# Patient Record
Sex: Male | Born: 1985 | Race: White | Hispanic: No | Marital: Single
Health system: Midwestern US, Community
[De-identification: ages and names within clinical notes are randomized; demographics above are authoritative.]

## PROBLEM LIST (undated history)

## (undated) ENCOUNTER — Emergency Department (HOSPITAL_COMMUNITY): Admission: EM | Payer: BC Managed Care – PPO | Source: Home / Self Care

## (undated) DIAGNOSIS — B182 Chronic viral hepatitis C: Principal | ICD-10-CM

## (undated) DIAGNOSIS — E785 Hyperlipidemia, unspecified: Secondary | ICD-10-CM

## (undated) DIAGNOSIS — F988 Other specified behavioral and emotional disorders with onset usually occurring in childhood and adolescence: Secondary | ICD-10-CM

## (undated) DIAGNOSIS — F419 Anxiety disorder, unspecified: Secondary | ICD-10-CM

## (undated) DIAGNOSIS — K529 Noninfective gastroenteritis and colitis, unspecified: Secondary | ICD-10-CM

## (undated) DIAGNOSIS — K759 Inflammatory liver disease, unspecified: Secondary | ICD-10-CM

## (undated) DIAGNOSIS — L0390 Cellulitis, unspecified: Secondary | ICD-10-CM

## (undated) DIAGNOSIS — E559 Vitamin D deficiency, unspecified: Secondary | ICD-10-CM

## (undated) HISTORY — DX: Hyperlipidemia, unspecified: E78.5

## (undated) HISTORY — DX: Noninfective gastroenteritis and colitis, unspecified: K52.9

## (undated) HISTORY — DX: Anxiety disorder, unspecified: F41.9

## (undated) HISTORY — DX: Other specified behavioral and emotional disorders with onset usually occurring in childhood and adolescence: F98.8

## (undated) HISTORY — DX: Inflammatory liver disease, unspecified: K75.9

## (undated) HISTORY — PX: TENDON REPAIR: SHX5111

## (undated) HISTORY — DX: Vitamin D deficiency, unspecified: E55.9

---
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
## 2006-06-17 ENCOUNTER — Emergency Department (HOSPITAL_COMMUNITY): Admission: EM | Admit: 2006-06-17 | Discharge: 2006-06-17 | Payer: Self-pay | Admitting: Emergency Medicine

## 2006-06-27 ENCOUNTER — Emergency Department (HOSPITAL_COMMUNITY): Admission: AD | Admit: 2006-06-27 | Discharge: 2006-06-27 | Payer: Self-pay | Admitting: Emergency Medicine

## 2006-08-22 ENCOUNTER — Emergency Department (HOSPITAL_COMMUNITY): Admission: EM | Admit: 2006-08-22 | Discharge: 2006-08-22 | Payer: Self-pay | Admitting: Emergency Medicine

## 2006-11-08 ENCOUNTER — Ambulatory Visit: Payer: Self-pay | Admitting: Psychiatry

## 2006-11-08 ENCOUNTER — Inpatient Hospital Stay (HOSPITAL_COMMUNITY): Admission: AD | Admit: 2006-11-08 | Discharge: 2006-11-12 | Payer: Self-pay | Admitting: Psychiatry

## 2007-06-25 ENCOUNTER — Emergency Department (HOSPITAL_COMMUNITY): Admission: EM | Admit: 2007-06-25 | Discharge: 2007-06-25 | Payer: Self-pay | Admitting: Emergency Medicine

## 2008-06-02 ENCOUNTER — Emergency Department (HOSPITAL_COMMUNITY): Admission: EM | Admit: 2008-06-02 | Discharge: 2008-06-03 | Payer: Self-pay | Admitting: Emergency Medicine

## 2009-02-15 ENCOUNTER — Emergency Department (HOSPITAL_COMMUNITY): Admission: EM | Admit: 2009-02-15 | Discharge: 2009-02-16 | Payer: Self-pay | Admitting: Emergency Medicine

## 2009-02-17 ENCOUNTER — Emergency Department (HOSPITAL_COMMUNITY): Admission: EM | Admit: 2009-02-17 | Discharge: 2009-02-17 | Payer: Self-pay | Admitting: Emergency Medicine

## 2009-02-23 ENCOUNTER — Emergency Department (HOSPITAL_COMMUNITY): Admission: EM | Admit: 2009-02-23 | Discharge: 2009-02-23 | Payer: Self-pay | Admitting: Emergency Medicine

## 2009-10-23 ENCOUNTER — Emergency Department (HOSPITAL_COMMUNITY): Admission: EM | Admit: 2009-10-23 | Discharge: 2009-10-23 | Payer: Self-pay | Admitting: Emergency Medicine

## 2010-03-20 ENCOUNTER — Emergency Department (HOSPITAL_COMMUNITY): Admission: EM | Admit: 2010-03-20 | Discharge: 2010-03-20 | Payer: Self-pay | Admitting: Emergency Medicine

## 2010-06-13 ENCOUNTER — Emergency Department (HOSPITAL_COMMUNITY): Admission: EM | Admit: 2010-06-13 | Discharge: 2010-06-13 | Payer: Self-pay | Admitting: Emergency Medicine

## 2010-06-13 ENCOUNTER — Encounter (INDEPENDENT_AMBULATORY_CARE_PROVIDER_SITE_OTHER): Payer: Self-pay | Admitting: *Deleted

## 2010-06-19 ENCOUNTER — Encounter: Payer: Self-pay | Admitting: Gastroenterology

## 2010-06-20 ENCOUNTER — Telehealth (INDEPENDENT_AMBULATORY_CARE_PROVIDER_SITE_OTHER): Payer: Self-pay | Admitting: *Deleted

## 2010-07-16 ENCOUNTER — Emergency Department (HOSPITAL_COMMUNITY): Admission: EM | Admit: 2010-07-16 | Discharge: 2010-07-16 | Payer: Self-pay | Admitting: Emergency Medicine

## 2010-07-18 ENCOUNTER — Encounter: Payer: Self-pay | Admitting: Gastroenterology

## 2010-08-01 ENCOUNTER — Ambulatory Visit: Payer: Self-pay | Admitting: Gastroenterology

## 2010-08-01 DIAGNOSIS — R109 Unspecified abdominal pain: Secondary | ICD-10-CM

## 2010-08-01 DIAGNOSIS — G8929 Other chronic pain: Secondary | ICD-10-CM

## 2010-08-01 DIAGNOSIS — R197 Diarrhea, unspecified: Secondary | ICD-10-CM

## 2010-08-08 ENCOUNTER — Emergency Department (HOSPITAL_COMMUNITY): Admission: EM | Admit: 2010-08-08 | Discharge: 2010-08-08 | Payer: Self-pay | Admitting: Emergency Medicine

## 2010-08-11 ENCOUNTER — Emergency Department (HOSPITAL_COMMUNITY)
Admission: EM | Admit: 2010-08-11 | Discharge: 2010-08-11 | Payer: Self-pay | Source: Home / Self Care | Admitting: Emergency Medicine

## 2010-08-17 ENCOUNTER — Emergency Department (HOSPITAL_COMMUNITY): Admission: EM | Admit: 2010-08-17 | Discharge: 2010-08-17 | Payer: Self-pay | Admitting: Emergency Medicine

## 2010-08-19 ENCOUNTER — Emergency Department (HOSPITAL_COMMUNITY): Admission: EM | Admit: 2010-08-19 | Discharge: 2010-08-20 | Payer: Self-pay | Admitting: Emergency Medicine

## 2010-08-23 ENCOUNTER — Emergency Department (HOSPITAL_COMMUNITY): Admission: EM | Admit: 2010-08-23 | Discharge: 2010-08-23 | Payer: Self-pay | Admitting: Emergency Medicine

## 2010-09-07 ENCOUNTER — Emergency Department (HOSPITAL_COMMUNITY): Admission: EM | Admit: 2010-09-07 | Discharge: 2010-09-07 | Payer: Self-pay | Admitting: Emergency Medicine

## 2010-09-08 ENCOUNTER — Emergency Department (HOSPITAL_COMMUNITY): Admission: EM | Admit: 2010-09-08 | Discharge: 2010-09-08 | Payer: Self-pay | Admitting: Emergency Medicine

## 2010-09-15 ENCOUNTER — Emergency Department (HOSPITAL_COMMUNITY)
Admission: EM | Admit: 2010-09-15 | Discharge: 2010-09-15 | Payer: Self-pay | Source: Home / Self Care | Admitting: Emergency Medicine

## 2010-09-27 ENCOUNTER — Emergency Department (HOSPITAL_COMMUNITY)
Admission: EM | Admit: 2010-09-27 | Discharge: 2010-09-27 | Payer: Self-pay | Source: Home / Self Care | Admitting: Emergency Medicine

## 2010-10-09 ENCOUNTER — Emergency Department (HOSPITAL_COMMUNITY)
Admission: EM | Admit: 2010-10-09 | Discharge: 2010-10-10 | Payer: Self-pay | Source: Home / Self Care | Admitting: Emergency Medicine

## 2010-10-11 ENCOUNTER — Emergency Department (HOSPITAL_COMMUNITY)
Admission: EM | Admit: 2010-10-11 | Discharge: 2010-10-11 | Payer: Self-pay | Source: Home / Self Care | Admitting: Emergency Medicine

## 2010-10-15 ENCOUNTER — Emergency Department (HOSPITAL_COMMUNITY)
Admission: EM | Admit: 2010-10-15 | Discharge: 2010-10-15 | Payer: Self-pay | Source: Home / Self Care | Admitting: Emergency Medicine

## 2010-10-29 ENCOUNTER — Emergency Department (HOSPITAL_COMMUNITY)
Admission: EM | Admit: 2010-10-29 | Discharge: 2010-10-29 | Payer: Self-pay | Source: Home / Self Care | Admitting: Emergency Medicine

## 2010-11-12 ENCOUNTER — Emergency Department (HOSPITAL_COMMUNITY)
Admission: EM | Admit: 2010-11-12 | Discharge: 2010-11-12 | Payer: Self-pay | Source: Home / Self Care | Admitting: Emergency Medicine

## 2010-11-12 NOTE — Letter (Signed)
Summary: Results Letter  [facility]  [address] [place], [place] [address]   Phone: [phone]  Fax: [phone]        August 01, 2010 MRN: [id number]    Matthew Carroll [address] [place], [place]  [address]    Dear Mr. BUFFA,  It is my pleasure to have treated you recently as a new patient in my office. I appreciate your confidence and the opportunity to participate in your care.  Since I do have a busy inpatient endoscopy schedule and office schedule, my office hours vary weekly. I am, however, available for emergency calls everyday through my office. If I am not available for an urgent office appointment, another one of our gastroenterologist will be able to assist you.  My well-trained staff are prepared to help you at all times. For emergencies after office hours, a physician from our Gastroenterology section is always available through my 24 hour answering service  Once again I welcome you as a new patient and I look forward to a happy and healthy relationship             Sincerely,  Louis Meckel MD  This letter has been electronically signed by your physician.  Appended Document: Results Letter letter mailed

## 2010-11-12 NOTE — Letter (Signed)
Summary: [facility] Instructions  [facility]  [address] [place], [place] [address]   Phone: [phone]  Fax: [phone]       Matthew Carroll    17-Feb-1986    MRN: [id number]        Procedure Day /Date:TUESDAY 09/03/2010     Arrival Time:2:30PM     Procedure Time:3:30PM     Location of Procedure:                    X   [facility] (4th Floor)                        PREPARATION FOR COLONOSCOPY WITH MOVIPREP   Starting 5 days prior to your procedure 11/17/2011do not eat nuts, seeds, popcorn, corn, beans, peas,  salads, or any raw vegetables.  Do not take any fiber supplements (e.g. Metamucil, Citrucel, and Benefiber).  THE DAY BEFORE YOUR PROCEDURE         DATE: 09/02/2010  DAY: MONDAY  1.  Drink clear liquids the entire day-NO SOLID FOOD  2.  Do not drink anything colored red or purple.  Avoid juices with pulp.  No orange juice.  3.  Drink at least 64 oz. (8 glasses) of fluid/clear liquids during the day to prevent dehydration and help the prep work efficiently.  CLEAR LIQUIDS INCLUDE: Water Jello Ice Popsicles Tea (sugar ok, no milk/cream) Powdered fruit flavored drinks Coffee (sugar ok, no milk/cream) Gatorade Juice: apple, white grape, white cranberry  Lemonade Clear bullion, consomm, broth Carbonated beverages (any kind) Strained chicken noodle soup Hard Candy                             4.  In the morning, mix first dose of MoviPrep solution:    Empty 1 Pouch A and 1 Pouch B into the disposable container    Add lukewarm drinking water to the top line of the container. Mix to dissolve    Refrigerate (mixed solution should be used within 24 hrs)  5.  Begin drinking the prep at 5:00 p.m. The MoviPrep container is divided by 4 marks.   Every 15 minutes drink the solution down to the next mark (approximately 8 oz) until the full liter is complete.   6.  Follow completed prep with 16 oz of clear liquid of your choice (Nothing red  or purple).  Continue to drink clear liquids until bedtime.  7.  Before going to bed, mix second dose of MoviPrep solution:    Empty 1 Pouch A and 1 Pouch B into the disposable container    Add lukewarm drinking water to the top line of the container. Mix to dissolve    Refrigerate  THE DAY OF YOUR PROCEDURE      DATE: 09/03/2010 DAY: TUESDAY  Beginning at 10:30AMa.m. (5 hours before procedure):         1. Every 15 minutes, drink the solution down to the next mark (approx 8 oz) until the full liter is complete.  2. Follow completed prep with 16 oz. of clear liquid of your choice.    3. You may drink clear liquids until 1:30PM (2 HOURS BEFORE PROCEDURE).   MEDICATION INSTRUCTIONS  Unless otherwise instructed, you should take regular prescription medications with a small sip of water   as early as possible the morning of your procedure.  OTHER INSTRUCTIONS  You will need a responsible adult at least 24 years of age to accompany you and drive you home.   This person must remain in the waiting room during your procedure.  Wear loose fitting clothing that is easily removed.  Leave jewelry and other valuables at home.  However, you may wish to bring a book to read or  an iPod/MP3 player to listen to music as you wait for your procedure to start.  Remove all body piercing jewelry and leave at home.  Total time from sign-in until discharge is approximately 2-3 hours.  You should go home directly after your procedure and rest.  You can resume normal activities the  day after your procedure.  The day of your procedure you should not:   Drive   Make legal decisions   Operate machinery   Drink alcohol   Return to work  You will receive specific instructions about eating, activities and medications before you leave.    The above instructions have been reviewed and explained to me by   _______________________    I fully understand and can verbalize these  instructions _____________________________ Date _________

## 2010-11-12 NOTE — Assessment & Plan Note (Signed)
Summary: SEVERE ABD PAIN/POST ER VISIT  (NEW TO GI)   Matthew Carroll   History of Present Illness Visit Type: consult Primary GI MD: Melvia Heaps MD [facility] Primary Provider: Lucky Cowboy, MD Requesting Provider: Lucky Cowboy, MD Chief Complaint: Abdominal pain History of Present Illness:   Matthew Carroll is a 25 year old white male referred at the request of  Lucky Cowboy, M.D. for evaluation of abdominal pain and diarrhea.  For the past 8 months he has been complaining of fairly diffuse lower abdominal and periumbilical pain associated with frequent bowel movements.  At least twice a week he may have days of multiple loose stools, 3-4 times a day.  They are accompanied  by urgency.  At times stools are watery.  There is no history of melena or hematochezia.  Symptoms have been worsening over the past month.  He has lost 16 pounds.  He was seen in the ER and at his primary care doctor's office.  CT Scan, which I reviewed, demonstrated thickening of the bowel on the left colon, rectum and parts of the sigmoid.  Lab work was pertinent for positive P.-ASCA IgA.  CBC is normal.  There is no history of antibiotic use or family history of inflammatory bowel disease.    GI Review of Systems    Reports abdominal pain, acid reflux, and  heartburn.      Denies belching, bloating, chest pain, dysphagia with liquids, dysphagia with solids, loss of appetite, nausea, vomiting, vomiting blood, weight loss, and  weight gain.      Reports change in bowel habits, diarrhea, and  irritable bowel syndrome.     Denies anal fissure, black tarry stools, constipation, diverticulosis, fecal incontinence, heme positive stool, hemorrhoids, jaundice, light color stool, liver problems, rectal bleeding, and  rectal pain. Preventive Screening-Counseling & Management  Alcohol-Tobacco     Smoking Status: current      Drug Use:  no.      Current Medications (verified): 1)  Alprazolam 1 Mg Tabs (Alprazolam) .... Take Up To 3  Tablets By Mouth For Anxiety Attack  Allergies (verified): No Known Drug Allergies  Past History:  Past Medical History: Anxiety Disorder Hepatitis B  Past Surgical History: tendon repair-thumb  Family History: No FH of Colon Cancer: Family History of Diabetes: Father  Social History: Married, 1 boy, 1 on-the-way Stage Hand Patient currently smokes.  Alcohol Use - no Daily Caffeine Use 4/day Illicit Drug Use - no Smoking Status:  current Drug Use:  no  Review of Systems       The patient complains of cough, headaches-new, and sleeping problems.  The patient denies allergy/sinus, anemia, anxiety-new, arthritis/joint pain, back pain, blood in urine, breast changes/lumps, confusion, coughing up blood, depression-new, fainting, fatigue, fever, hearing problems, heart murmur, heart rhythm changes, itching, muscle pains/cramps, night sweats, nosebleeds, shortness of breath, skin rash, sore throat, swelling of feet/legs, swollen lymph glands, thirst - excessive, urination - excessive, urination changes/pain, urine leakage, vision changes, and voice change.         All other systems were reviewed and were negative   Vital Signs:  Patient profile:   25 year old male Height:      69 inches Weight:      184 pounds BMI:     27.27 Pulse rate:   80 / minute Pulse rhythm:   regular BP sitting:   100 / 66  (left arm) Cuff size:   regular  Vitals Entered By: Francee Piccolo CMA Duncan Dull) (August 01, 2010  11:37 AM)  Physical Exam  Additional Exam:  On physical exam he is a well-developed well-nourished male  skin: anicteric HEENT: normocephalic; PEERLA; no nasal or pharyngeal abnormalities neck: supple nodes: no cervical lymphadenopathy chest: clear to ausculatation and percussion heart: no murmurs, gallops, or rubs abd: soft, nontender; BS normoactive; no abdominal masses,  organomegaly; there is mild tenderness in the left lower quadrant without guarding or rebound rectal:  deferred ext: no cynanosis, clubbing, edema skeletal: no deformities neuro: oriented x 3; no focal abnormalities    Impression & Recommendations:  Problem # 1:  NONSPECIFIC ABN FINDING RAD & OTH EXAM GI TRACT (ICD-793.4) Symptoms including diarrhea, pain and findings on CT scan suggest inflammatory bowel disease.  A positive P-ASCA raises the concern for Crohn's colitis.  Enteric infection is less likely.  Recommendations #1 check stools for C. difficile toxin, C&S #2 colonoscopy  Risks, alternatives, and complications of the procedure, including bleeding, perforation, and possible need for surgery, were explained to the patient.  Patient's questions were answered.  Orders: T-Culture, C-Diff Toxin A/B ([id number]) T-Culture, Stool ([id number]) Colonoscopy (Colon)  Patient Instructions: 1)  Copy sent to : Lucky Cowboy, MD

## 2010-11-12 NOTE — Progress Notes (Signed)
Summary: Severe Abd Pain/Sooner Appt. Request  Phone Note From Other Clinic   Caller: Aram Beecham @ Dr Oneta Rack [phone] Reason for Call: Schedule Patient Appt Summary of Call: Wonders if we can work pt in sooner than first available 10-20. Will be a new pt. Severe abd pain, colitits. Initial call taken by: Leanor Kail [facility],  June 20, 2010 1:28 PM  Follow-up for Phone Call        Pt. will see [facility] on 06-21-10 at 9:30am. Aram Beecham will advise pt. of appt/med.list/co-pay/cx.policy. She will fax records to [facility]. Follow-up by: Laureen Ochs LPN,  June 20, 2010 1:43 PM

## 2010-11-17 ENCOUNTER — Emergency Department (HOSPITAL_COMMUNITY)
Admission: EM | Admit: 2010-11-17 | Discharge: 2010-11-17 | Disposition: A | Discharge status: Home / Self Care | Payer: BC Managed Care – PPO | Attending: Emergency Medicine | Admitting: Emergency Medicine

## 2010-11-17 DIAGNOSIS — A6000 Herpesviral infection of urogenital system, unspecified: Secondary | ICD-10-CM | POA: Insufficient documentation

## 2010-11-28 ENCOUNTER — Emergency Department (HOSPITAL_COMMUNITY)
Admission: EM | Admit: 2010-11-28 | Discharge: 2010-11-28 | Disposition: A | Discharge status: Home / Self Care | Payer: BC Managed Care – PPO | Attending: Emergency Medicine | Admitting: Emergency Medicine

## 2010-11-28 DIAGNOSIS — R109 Unspecified abdominal pain: Secondary | ICD-10-CM | POA: Insufficient documentation

## 2010-11-28 DIAGNOSIS — A6000 Herpesviral infection of urogenital system, unspecified: Secondary | ICD-10-CM | POA: Insufficient documentation

## 2010-11-28 DIAGNOSIS — R599 Enlarged lymph nodes, unspecified: Secondary | ICD-10-CM | POA: Insufficient documentation

## 2010-12-06 ENCOUNTER — Emergency Department (HOSPITAL_COMMUNITY)
Admission: EM | Admit: 2010-12-06 | Discharge: 2010-12-06 | Disposition: A | Discharge status: Home / Self Care | Payer: BC Managed Care – PPO | Attending: Emergency Medicine | Admitting: Emergency Medicine

## 2010-12-06 DIAGNOSIS — R109 Unspecified abdominal pain: Secondary | ICD-10-CM | POA: Insufficient documentation

## 2010-12-06 DIAGNOSIS — M5430 Sciatica, unspecified side: Secondary | ICD-10-CM | POA: Insufficient documentation

## 2010-12-06 DIAGNOSIS — Z79899 Other long term (current) drug therapy: Secondary | ICD-10-CM | POA: Insufficient documentation

## 2010-12-06 DIAGNOSIS — IMO0001 Reserved for inherently not codable concepts without codable children: Secondary | ICD-10-CM | POA: Insufficient documentation

## 2010-12-09 ENCOUNTER — Emergency Department (HOSPITAL_COMMUNITY)
Admission: EM | Admit: 2010-12-09 | Discharge: 2010-12-10 | Disposition: A | Discharge status: Home / Self Care | Payer: BC Managed Care – PPO | Attending: Emergency Medicine | Admitting: Emergency Medicine

## 2010-12-09 DIAGNOSIS — R1032 Left lower quadrant pain: Secondary | ICD-10-CM | POA: Insufficient documentation

## 2010-12-09 DIAGNOSIS — F17200 Nicotine dependence, unspecified, uncomplicated: Secondary | ICD-10-CM | POA: Insufficient documentation

## 2010-12-09 DIAGNOSIS — K5289 Other specified noninfective gastroenteritis and colitis: Secondary | ICD-10-CM | POA: Insufficient documentation

## 2010-12-09 DIAGNOSIS — Z79899 Other long term (current) drug therapy: Secondary | ICD-10-CM | POA: Insufficient documentation

## 2010-12-09 DIAGNOSIS — B009 Herpesviral infection, unspecified: Secondary | ICD-10-CM | POA: Insufficient documentation

## 2010-12-09 LAB — CBC
HCT: 42.2 % (ref 39.0–52.0)
Hemoglobin: 14.8 g/dL (ref 13.0–17.0)
MCHC: 35.1 g/dL (ref 30.0–36.0)

## 2010-12-09 LAB — BASIC METABOLIC PANEL
CO2: 27 mEq/L (ref 19–32)
Calcium: 9.2 mg/dL (ref 8.4–10.5)
Chloride: 105 mEq/L (ref 96–112)
Glucose, Bld: 115 mg/dL — ABNORMAL HIGH (ref 70–99)
Potassium: 3.6 mEq/L (ref 3.5–5.1)
Sodium: 140 mEq/L (ref 135–145)

## 2010-12-09 LAB — DIFFERENTIAL
Basophils Absolute: 0 10*3/uL (ref 0.0–0.1)
Lymphocytes Relative: 42 % (ref 12–46)
Monocytes Absolute: 0.9 10*3/uL (ref 0.1–1.0)
Neutro Abs: 5.3 10*3/uL (ref 1.7–7.7)

## 2010-12-23 LAB — GC/CHLAMYDIA PROBE AMP, GENITAL
Chlamydia, DNA Probe: NEGATIVE
GC Probe Amp, Genital: NEGATIVE

## 2010-12-23 LAB — URINALYSIS, ROUTINE W REFLEX MICROSCOPIC
Bilirubin Urine: NEGATIVE
Glucose, UA: NEGATIVE mg/dL
Specific Gravity, Urine: 1.002 — ABNORMAL LOW (ref 1.005–1.030)
pH: 6 (ref 5.0–8.0)

## 2010-12-23 LAB — CBC
HCT: 44 % (ref 39.0–52.0)
MCH: 32.2 pg (ref 26.0–34.0)
MCHC: 34.9 g/dL (ref 30.0–36.0)
MCHC: 35.5 g/dL (ref 30.0–36.0)
MCV: 90 fL (ref 78.0–100.0)
MCV: 90.7 fL (ref 78.0–100.0)
Platelets: 183 10*3/uL (ref 150–400)
RDW: 12.7 % (ref 11.5–15.5)
RDW: 12.8 % (ref 11.5–15.5)
WBC: 10.8 10*3/uL — ABNORMAL HIGH (ref 4.0–10.5)

## 2010-12-23 LAB — DIFFERENTIAL
Basophils Absolute: 0 10*3/uL (ref 0.0–0.1)
Basophils Absolute: 0 10*3/uL (ref 0.0–0.1)
Basophils Relative: 0 % (ref 0–1)
Basophils Relative: 0 % (ref 0–1)
Eosinophils Relative: 1 % (ref 0–5)
Eosinophils Relative: 2 % (ref 0–5)
Lymphocytes Relative: 41 % (ref 12–46)
Monocytes Absolute: 0.8 10*3/uL (ref 0.1–1.0)
Neutro Abs: 5.2 10*3/uL (ref 1.7–7.7)

## 2010-12-23 LAB — HERPES SIMPLEX VIRUS CULTURE

## 2010-12-23 LAB — POCT I-STAT, CHEM 8
BUN: 12 mg/dL (ref 6–23)
Calcium, Ion: 1.11 mmol/L — ABNORMAL LOW (ref 1.12–1.32)
HCT: 42 % (ref 39.0–52.0)
HCT: 47 % (ref 39.0–52.0)
Sodium: 142 mEq/L (ref 135–145)
TCO2: 28 mmol/L (ref 0–100)
TCO2: 30 mmol/L (ref 0–100)

## 2010-12-24 LAB — COMPREHENSIVE METABOLIC PANEL
ALT: 20 U/L (ref 0–53)
AST: 28 U/L (ref 0–37)
Albumin: 4.5 g/dL (ref 3.5–5.2)
Alkaline Phosphatase: 51 U/L (ref 39–117)
Chloride: 104 mEq/L (ref 96–112)
GFR calc Af Amer: >60 mL/min (ref >60)
Potassium: 3.6 mEq/L (ref 3.5–5.1)
Sodium: 140 mEq/L (ref 135–145)
Total Bilirubin: 1 mg/dL (ref 0.3–1.2)
Total Protein: 7.5 g/dL (ref 6.0–8.3)

## 2010-12-24 LAB — DIFFERENTIAL
Basophils Absolute: 0 10*3/uL (ref 0.0–0.1)
Basophils Relative: 0 % (ref 0–1)
Eosinophils Relative: 2 % (ref 0–5)
Monocytes Absolute: 0.9 10*3/uL (ref 0.1–1.0)
Monocytes Relative: 8 % (ref 3–12)

## 2010-12-24 LAB — CBC
MCV: 87.7 fL (ref 78.0–100.0)
Platelets: 205 10*3/uL (ref 150–400)
RBC: 5.19 MIL/uL (ref 4.22–5.81)
RDW: 12.4 % (ref 11.5–15.5)
WBC: 11.1 10*3/uL — ABNORMAL HIGH (ref 4.0–10.5)

## 2010-12-24 LAB — URINALYSIS, ROUTINE W REFLEX MICROSCOPIC
Bilirubin Urine: NEGATIVE
Glucose, UA: NEGATIVE mg/dL
Hgb urine dipstick: NEGATIVE
Protein, ur: NEGATIVE mg/dL
Urobilinogen, UA: 0.2 mg/dL (ref 0.0–1.0)

## 2010-12-25 LAB — DIFFERENTIAL
Basophils Absolute: 0 10*3/uL (ref 0.0–0.1)
Basophils Relative: 0 % (ref 0–1)
Eosinophils Absolute: 0.2 10*3/uL (ref 0.0–0.7)
Neutro Abs: 5.3 10*3/uL (ref 1.7–7.7)
Neutrophils Relative %: 61 % (ref 43–77)

## 2010-12-25 LAB — COMPREHENSIVE METABOLIC PANEL
ALT: 23 U/L (ref 0–53)
Alkaline Phosphatase: 48 U/L (ref 39–117)
BUN: 10 mg/dL (ref 6–23)
CO2: 27 mEq/L (ref 19–32)
GFR calc non Af Amer: >60 mL/min (ref >60)
Glucose, Bld: 115 mg/dL — ABNORMAL HIGH (ref 70–99)
Potassium: 4.1 mEq/L (ref 3.5–5.1)
Sodium: 137 mEq/L (ref 135–145)
Total Bilirubin: 0.4 mg/dL (ref 0.3–1.2)

## 2010-12-25 LAB — CBC
HCT: 44.2 % (ref 39.0–52.0)
Hemoglobin: 15.4 g/dL (ref 13.0–17.0)
MCV: 90.2 fL (ref 78.0–100.0)
WBC: 8.7 10*3/uL (ref 4.0–10.5)

## 2010-12-25 LAB — URINALYSIS, ROUTINE W REFLEX MICROSCOPIC
Glucose, UA: NEGATIVE mg/dL
Hgb urine dipstick: NEGATIVE
Ketones, ur: NEGATIVE mg/dL
Protein, ur: NEGATIVE mg/dL
pH: 7 (ref 5.0–8.0)

## 2010-12-25 LAB — LIPASE, BLOOD: Lipase: 28 U/L (ref 11–59)

## 2010-12-26 LAB — URINALYSIS, ROUTINE W REFLEX MICROSCOPIC
Glucose, UA: NEGATIVE mg/dL
Ketones, ur: NEGATIVE mg/dL
Protein, ur: NEGATIVE mg/dL
Urobilinogen, UA: 0.2 mg/dL (ref 0.0–1.0)

## 2010-12-26 LAB — DIFFERENTIAL
Basophils Absolute: 0 10*3/uL (ref 0.0–0.1)
Eosinophils Absolute: 0.2 10*3/uL (ref 0.0–0.7)
Eosinophils Relative: 2 % (ref 0–5)
Lymphocytes Relative: 45 % (ref 12–46)

## 2010-12-26 LAB — BASIC METABOLIC PANEL
BUN: 9 mg/dL (ref 6–23)
Calcium: 9.4 mg/dL (ref 8.4–10.5)
Chloride: 106 mEq/L (ref 96–112)
Creatinine, Ser: 1.04 mg/dL (ref 0.4–1.5)

## 2010-12-26 LAB — CBC
MCH: 31.8 pg (ref 26.0–34.0)
MCV: 87.6 fL (ref 78.0–100.0)
Platelets: 216 10*3/uL (ref 150–400)
RDW: 12.5 % (ref 11.5–15.5)
WBC: 10 10*3/uL (ref 4.0–10.5)

## 2010-12-28 LAB — URINALYSIS, ROUTINE W REFLEX MICROSCOPIC
Ketones, ur: NEGATIVE mg/dL
Nitrite: NEGATIVE
Protein, ur: NEGATIVE mg/dL

## 2010-12-28 LAB — DIFFERENTIAL
Eosinophils Absolute: 0.1 10*3/uL (ref 0.0–0.7)
Lymphocytes Relative: 27 % (ref 12–46)
Lymphs Abs: 2.1 10*3/uL (ref 0.7–4.0)
Neutrophils Relative %: 55 % (ref 43–77)

## 2010-12-28 LAB — BASIC METABOLIC PANEL
BUN: 8 mg/dL (ref 6–23)
Calcium: 8.8 mg/dL (ref 8.4–10.5)
Creatinine, Ser: 0.93 mg/dL (ref 0.4–1.5)
GFR calc non Af Amer: >60 mL/min (ref >60)

## 2010-12-28 LAB — CBC
Platelets: 145 10*3/uL — ABNORMAL LOW (ref 150–400)
WBC: 7.8 10*3/uL (ref 4.0–10.5)

## 2011-02-11 ENCOUNTER — Emergency Department (HOSPITAL_COMMUNITY)
Admission: EM | Admit: 2011-02-11 | Discharge: 2011-02-11 | Disposition: A | Discharge status: Home / Self Care | Payer: BC Managed Care – PPO | Attending: Emergency Medicine | Admitting: Emergency Medicine

## 2011-02-11 DIAGNOSIS — R599 Enlarged lymph nodes, unspecified: Secondary | ICD-10-CM | POA: Insufficient documentation

## 2011-02-11 DIAGNOSIS — R1909 Other intra-abdominal and pelvic swelling, mass and lump: Secondary | ICD-10-CM | POA: Insufficient documentation

## 2011-02-11 DIAGNOSIS — Z79899 Other long term (current) drug therapy: Secondary | ICD-10-CM | POA: Insufficient documentation

## 2011-02-11 DIAGNOSIS — R109 Unspecified abdominal pain: Secondary | ICD-10-CM | POA: Insufficient documentation

## 2011-02-11 DIAGNOSIS — L738 Other specified follicular disorders: Secondary | ICD-10-CM | POA: Insufficient documentation

## 2011-02-11 LAB — URINALYSIS, ROUTINE W REFLEX MICROSCOPIC
Hgb urine dipstick: NEGATIVE
Specific Gravity, Urine: 1.015 (ref 1.005–1.030)
Urobilinogen, UA: 1 mg/dL (ref 0.0–1.0)
pH: 6.5 (ref 5.0–8.0)

## 2011-02-25 ENCOUNTER — Emergency Department (HOSPITAL_COMMUNITY)
Admission: EM | Admit: 2011-02-25 | Discharge: 2011-02-25 | Disposition: A | Discharge status: Home / Self Care | Payer: BC Managed Care – PPO | Attending: Emergency Medicine | Admitting: Emergency Medicine

## 2011-02-25 DIAGNOSIS — A6000 Herpesviral infection of urogenital system, unspecified: Secondary | ICD-10-CM | POA: Insufficient documentation

## 2011-02-25 DIAGNOSIS — R21 Rash and other nonspecific skin eruption: Secondary | ICD-10-CM | POA: Insufficient documentation
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
## 2011-03-01 ENCOUNTER — Emergency Department (HOSPITAL_COMMUNITY)
Admission: EM | Admit: 2011-03-01 | Discharge: 2011-03-01 | Disposition: A | Discharge status: Home / Self Care | Payer: BC Managed Care – PPO | Attending: Emergency Medicine | Admitting: Emergency Medicine

## 2011-03-01 DIAGNOSIS — A6000 Herpesviral infection of urogenital system, unspecified: Secondary | ICD-10-CM | POA: Insufficient documentation

## 2011-03-04 ENCOUNTER — Emergency Department (HOSPITAL_COMMUNITY)
Admission: EM | Admit: 2011-03-04 | Discharge: 2011-03-05 | Disposition: A | Discharge status: Home / Self Care | Payer: BC Managed Care – PPO | Attending: Emergency Medicine | Admitting: Emergency Medicine

## 2011-03-04 DIAGNOSIS — M542 Cervicalgia: Secondary | ICD-10-CM | POA: Insufficient documentation

## 2011-03-04 DIAGNOSIS — R51 Headache: Secondary | ICD-10-CM | POA: Insufficient documentation

## 2011-03-04 DIAGNOSIS — M62838 Other muscle spasm: Secondary | ICD-10-CM | POA: Insufficient documentation

## 2011-03-05 ENCOUNTER — Emergency Department (HOSPITAL_COMMUNITY): Payer: BC Managed Care – PPO

## 2011-03-18 ENCOUNTER — Emergency Department (HOSPITAL_COMMUNITY)
Admission: EM | Admit: 2011-03-18 | Discharge: 2011-03-18 | Disposition: A | Discharge status: Home / Self Care | Payer: BC Managed Care – PPO | Attending: Emergency Medicine | Admitting: Emergency Medicine

## 2011-03-18 ENCOUNTER — Ambulatory Visit
Admission: RE | Admit: 2011-03-18 | Discharge: 2011-03-18 | Disposition: A | Discharge status: Home / Self Care | Payer: BC Managed Care – PPO | Source: Ambulatory Visit | Attending: Internal Medicine | Admitting: Internal Medicine

## 2011-03-18 ENCOUNTER — Other Ambulatory Visit: Payer: Self-pay | Admitting: Internal Medicine

## 2011-03-18 DIAGNOSIS — M542 Cervicalgia: Secondary | ICD-10-CM | POA: Insufficient documentation

## 2011-03-18 DIAGNOSIS — R05 Cough: Secondary | ICD-10-CM

## 2011-03-18 DIAGNOSIS — R6889 Other general symptoms and signs: Secondary | ICD-10-CM | POA: Insufficient documentation

## 2011-03-18 DIAGNOSIS — R509 Fever, unspecified: Secondary | ICD-10-CM | POA: Insufficient documentation

## 2011-03-18 DIAGNOSIS — Z79899 Other long term (current) drug therapy: Secondary | ICD-10-CM | POA: Insufficient documentation

## 2011-03-18 DIAGNOSIS — R059 Cough, unspecified: Secondary | ICD-10-CM | POA: Insufficient documentation

## 2011-03-18 DIAGNOSIS — X500XXA Overexertion from strenuous movement or load, initial encounter: Secondary | ICD-10-CM | POA: Insufficient documentation

## 2011-03-18 DIAGNOSIS — M25569 Pain in unspecified knee: Secondary | ICD-10-CM | POA: Insufficient documentation

## 2011-03-18 DIAGNOSIS — S139XXA Sprain of joints and ligaments of unspecified parts of neck, initial encounter: Secondary | ICD-10-CM | POA: Insufficient documentation

## 2011-03-18 DIAGNOSIS — R5381 Other malaise: Secondary | ICD-10-CM | POA: Insufficient documentation

## 2011-03-18 DIAGNOSIS — R51 Headache: Secondary | ICD-10-CM | POA: Insufficient documentation

## 2011-03-25 ENCOUNTER — Emergency Department (HOSPITAL_COMMUNITY)
Admission: EM | Admit: 2011-03-25 | Discharge: 2011-03-26 | Disposition: A | Discharge status: Home / Self Care | Payer: BC Managed Care – PPO | Attending: Emergency Medicine | Admitting: Emergency Medicine

## 2011-03-25 ENCOUNTER — Emergency Department (HOSPITAL_COMMUNITY): Payer: BC Managed Care – PPO

## 2011-03-25 DIAGNOSIS — F341 Dysthymic disorder: Secondary | ICD-10-CM | POA: Insufficient documentation

## 2011-03-25 DIAGNOSIS — Z79899 Other long term (current) drug therapy: Secondary | ICD-10-CM | POA: Insufficient documentation

## 2011-03-25 DIAGNOSIS — M79609 Pain in unspecified limb: Secondary | ICD-10-CM | POA: Insufficient documentation

## 2011-03-25 DIAGNOSIS — S60229A Contusion of unspecified hand, initial encounter: Secondary | ICD-10-CM | POA: Insufficient documentation

## 2011-03-25 DIAGNOSIS — R1032 Left lower quadrant pain: Secondary | ICD-10-CM | POA: Insufficient documentation

## 2011-03-25 DIAGNOSIS — W2209XA Striking against other stationary object, initial encounter: Secondary | ICD-10-CM | POA: Insufficient documentation

## 2011-03-25 LAB — CBC
Hemoglobin: 16.1 g/dL (ref 13.0–17.0)
MCH: 32.6 pg (ref 26.0–34.0)
RBC: 4.94 MIL/uL (ref 4.22–5.81)

## 2011-03-25 LAB — URINALYSIS, ROUTINE W REFLEX MICROSCOPIC
Glucose, UA: NEGATIVE mg/dL
Hgb urine dipstick: NEGATIVE
Specific Gravity, Urine: 1.015 (ref 1.005–1.030)

## 2011-03-25 LAB — BASIC METABOLIC PANEL
CO2: 28 mEq/L (ref 19–32)
Glucose, Bld: 91 mg/dL (ref 70–99)
Potassium: 4.1 mEq/L (ref 3.5–5.1)
Sodium: 140 mEq/L (ref 135–145)

## 2011-03-25 LAB — DIFFERENTIAL
Basophils Absolute: 0 10*3/uL (ref 0.0–0.1)
Basophils Relative: 0 % (ref 0–1)
Lymphocytes Relative: 25 % (ref 12–46)
Monocytes Relative: 5 % (ref 3–12)
Neutro Abs: 10.3 10*3/uL — ABNORMAL HIGH (ref 1.7–7.7)
Neutrophils Relative %: 69 % (ref 43–77)

## 2011-03-25 LAB — LIPASE, BLOOD: Lipase: 20 U/L (ref 11–59)

## 2011-04-01 ENCOUNTER — Emergency Department (HOSPITAL_COMMUNITY): Payer: BC Managed Care – PPO

## 2011-04-01 ENCOUNTER — Emergency Department (HOSPITAL_COMMUNITY)
Admission: EM | Admit: 2011-04-01 | Discharge: 2011-04-01 | Disposition: A | Discharge status: Home / Self Care | Payer: BC Managed Care – PPO | Attending: Emergency Medicine | Admitting: Emergency Medicine

## 2011-04-01 DIAGNOSIS — M25559 Pain in unspecified hip: Secondary | ICD-10-CM | POA: Insufficient documentation

## 2011-04-01 DIAGNOSIS — M7989 Other specified soft tissue disorders: Secondary | ICD-10-CM | POA: Insufficient documentation

## 2011-04-04 ENCOUNTER — Emergency Department (HOSPITAL_COMMUNITY)
Admission: EM | Admit: 2011-04-04 | Discharge: 2011-04-04 | Disposition: A | Discharge status: Home / Self Care | Payer: BC Managed Care – PPO | Attending: Emergency Medicine | Admitting: Emergency Medicine

## 2011-04-04 DIAGNOSIS — Z79899 Other long term (current) drug therapy: Secondary | ICD-10-CM | POA: Insufficient documentation

## 2011-04-04 DIAGNOSIS — M25559 Pain in unspecified hip: Secondary | ICD-10-CM | POA: Insufficient documentation

## 2011-04-04 DIAGNOSIS — M25459 Effusion, unspecified hip: Secondary | ICD-10-CM | POA: Insufficient documentation

## 2011-04-04 DIAGNOSIS — M76899 Other specified enthesopathies of unspecified lower limb, excluding foot: Secondary | ICD-10-CM | POA: Insufficient documentation

## 2011-04-04 DIAGNOSIS — F341 Dysthymic disorder: Secondary | ICD-10-CM | POA: Insufficient documentation

## 2011-04-11 ENCOUNTER — Emergency Department (HOSPITAL_COMMUNITY)
Admission: EM | Admit: 2011-04-11 | Discharge: 2011-04-11 | Disposition: A | Discharge status: Home / Self Care | Payer: BC Managed Care – PPO | Attending: Emergency Medicine | Admitting: Emergency Medicine

## 2011-04-11 DIAGNOSIS — M79609 Pain in unspecified limb: Secondary | ICD-10-CM | POA: Insufficient documentation

## 2011-04-11 DIAGNOSIS — F341 Dysthymic disorder: Secondary | ICD-10-CM | POA: Insufficient documentation

## 2011-04-11 DIAGNOSIS — L600 Ingrowing nail: Secondary | ICD-10-CM | POA: Insufficient documentation

## 2011-04-11 DIAGNOSIS — Z79899 Other long term (current) drug therapy: Secondary | ICD-10-CM | POA: Insufficient documentation

## 2011-04-11 DIAGNOSIS — M7989 Other specified soft tissue disorders: Secondary | ICD-10-CM | POA: Insufficient documentation

## 2011-04-12 ENCOUNTER — Emergency Department (HOSPITAL_COMMUNITY)
Admission: EM | Admit: 2011-04-12 | Discharge: 2011-04-12 | Disposition: A | Discharge status: Home / Self Care | Payer: BC Managed Care – PPO | Attending: Emergency Medicine | Admitting: Emergency Medicine

## 2011-04-12 DIAGNOSIS — Z09 Encounter for follow-up examination after completed treatment for conditions other than malignant neoplasm: Secondary | ICD-10-CM | POA: Insufficient documentation

## 2011-04-12 DIAGNOSIS — F341 Dysthymic disorder: Secondary | ICD-10-CM | POA: Insufficient documentation

## 2011-04-12 DIAGNOSIS — R609 Edema, unspecified: Secondary | ICD-10-CM | POA: Insufficient documentation

## 2011-04-12 DIAGNOSIS — M79609 Pain in unspecified limb: Secondary | ICD-10-CM | POA: Insufficient documentation

## 2011-04-12 DIAGNOSIS — Z79899 Other long term (current) drug therapy: Secondary | ICD-10-CM | POA: Insufficient documentation

## 2011-04-12 DIAGNOSIS — L600 Ingrowing nail: Secondary | ICD-10-CM | POA: Insufficient documentation

## 2011-04-27 ENCOUNTER — Emergency Department (HOSPITAL_COMMUNITY)
Admission: EM | Admit: 2011-04-27 | Discharge: 2011-04-27 | Disposition: A | Discharge status: Home / Self Care | Payer: BC Managed Care – PPO | Attending: Emergency Medicine | Admitting: Emergency Medicine

## 2011-04-27 DIAGNOSIS — R110 Nausea: Secondary | ICD-10-CM | POA: Insufficient documentation

## 2011-04-27 DIAGNOSIS — R1032 Left lower quadrant pain: Secondary | ICD-10-CM | POA: Insufficient documentation

## 2011-04-27 DIAGNOSIS — K5289 Other specified noninfective gastroenteritis and colitis: Secondary | ICD-10-CM | POA: Insufficient documentation

## 2011-04-27 LAB — CBC
HCT: 43.9 % (ref 39.0–52.0)
MCV: 86.2 fL (ref 78.0–100.0)
Platelets: 234 10*3/uL (ref 150–400)
RBC: 5.09 MIL/uL (ref 4.22–5.81)
WBC: 22.1 10*3/uL — ABNORMAL HIGH (ref 4.0–10.5)

## 2011-04-27 LAB — POCT I-STAT, CHEM 8
BUN: 6 mg/dL (ref 6–23)
Chloride: 103 mEq/L (ref 96–112)
Sodium: 142 mEq/L (ref 135–145)

## 2011-04-30 ENCOUNTER — Telehealth: Payer: Self-pay | Admitting: Gastroenterology

## 2011-05-01 NOTE — Telephone Encounter (Signed)
Left message for her to call back

## 2011-05-02 NOTE — Telephone Encounter (Signed)
Pt scheduled to see Willette Cluster NP 05/06/11@1 :30pmAram Beecham to notify pt and fax records.

## 2011-05-06 ENCOUNTER — Other Ambulatory Visit: Payer: Self-pay | Admitting: Nurse Practitioner

## 2011-05-06 ENCOUNTER — Encounter: Payer: Self-pay | Admitting: Nurse Practitioner

## 2011-05-06 ENCOUNTER — Ambulatory Visit (INDEPENDENT_AMBULATORY_CARE_PROVIDER_SITE_OTHER): Payer: BC Managed Care – PPO | Admitting: Nurse Practitioner

## 2011-05-06 DIAGNOSIS — K5289 Other specified noninfective gastroenteritis and colitis: Secondary | ICD-10-CM

## 2011-05-06 DIAGNOSIS — K529 Noninfective gastroenteritis and colitis, unspecified: Secondary | ICD-10-CM

## 2011-05-06 DIAGNOSIS — R933 Abnormal findings on diagnostic imaging of other parts of digestive tract: Secondary | ICD-10-CM

## 2011-05-06 DIAGNOSIS — R1084 Generalized abdominal pain: Secondary | ICD-10-CM

## 2011-05-06 DIAGNOSIS — F411 Generalized anxiety disorder: Secondary | ICD-10-CM

## 2011-05-06 DIAGNOSIS — R198 Other specified symptoms and signs involving the digestive system and abdomen: Secondary | ICD-10-CM

## 2011-05-06 DIAGNOSIS — F419 Anxiety disorder, unspecified: Secondary | ICD-10-CM | POA: Insufficient documentation

## 2011-05-06 MED ORDER — HYOSCYAMINE SULFATE ER 0.375 MG PO TB12: ORAL_TABLET | ORAL | Status: AC

## 2011-05-06 NOTE — Assessment & Plan Note (Addendum)
Unfortunately it sounds like the patient had a troubled childhood. Dr. Oneta Rack is treating him for anxiety and depression. He is on an antidepressant and benzodiazepine.

## 2011-05-06 NOTE — Assessment & Plan Note (Signed)
Please refer to the extended HPI. Left-sided colitis on a CT scan in September 2011, resolution of colitis on CT scan two months later. Colonoscopy a few months later by Dr. Elnoria Howard normal except for focal area of colitis with biopsies suggesting ischemic injury. Patient has continued to have frequent episodes of abdominal pain and diarrhea for which he is in process of evaluation by Dr. Darnelle Spangle, and IBD specialist at [facility]. I have reviewed Dr. Debroah Loop office notes after his office promptly faxed them over today.There isn't any convincing evidence for IBD. Except for cigarette smoking, the cause of what appeared to be ischemic colitis in September 2011 is not known. Thrombophilia workup negative. Patient has not used cocaine in several years. The patient is to followup at [facility] in mid August and is supposed to get an MR enterography prior to that appointment but unable to get it done for unclear reasons. I will schedule the patient's MR enterography myself and send results to Dr. Lorenda Peck. At this point in time I do not have any medications to offer the patient as I don't yet know what I would be treating.  I had a long discussion with the patient about all the narcotic prescriptions he has gotten filled since late April of this year from various providers for various reasons (please refer to the HPI). He now knows that narcotic prescriptions can be tracked down and that he needs to be careful with his handling of these medications. I gave him a prescription for a bowel anti-spasmotic as I am reluctant to prescribe narcotics for him at this point. Will await Dr. Debroah Loop recommendations and proceed accordingly.

## 2011-05-06 NOTE — Patient Instructions (Signed)
We scheduled the MR Enterography ABD/Pelvis at [facility]. Directions provided. We sent a prescription for Levbid for abdominal pain and spasms to [facility] [address]

## 2011-05-06 NOTE — Progress Notes (Signed)
05/06/2011 Matthew Carroll [id number] 1986/06/10   HISTORY OF PRESENT ILLNESS:  This patient is a 25 year old white male who was seen by Dr. Arlyce Dice at the request of Dr. Oneta Rack in January of this year for abdominal pain, diarrhea and an abnormal CAT scan. At that visit the patient complained of diffuse abdominal pain and frequent bowel movements over the last 8 months associated with weight loss. The patient had a CT scan of the abdomen and pelvis a few months earlier (06/13/2010) which revealed  mural thickening of the rectum, sections of the sigmoid and descending colon and mild mural thickening at the splenic flexure as well. Lab work from PCP office was pertinent for positive  P ASCA IgA. Of note, the patient had another CT scan of the abdomen and pelvis 08/20/2010 for left groin pain and other than a left inguinal lymph node the study was normal. At his January visit with Dr. Arlyce Dice, stool studies were ordered and colonoscopy scheduled . For unclear reasons however the patient began seeing Dr. Jeani Hawking a few weeks later and has not been back to our office since. He is here today at the request of Dr. Oneta Rack for evaluation of ongoing abdominal pain and diarrhea. I requested and received records from Dr. Haywood Pao office while the patient was here. Colonoscopy with intubation of the terminal ileum by Dr.Hung in Feb. 2012 unremarkable except for an isolated patch of descending colon colitis and internal hemorrhoids. Biopsies compatible with focal ischemic pattern injury. Random colon biopsies were normal. Basically Dr. Elnoria Howard was unsure of why the patient had this focal ischemia, patient was ultimately referred to [facility] to Dr. Darnelle Spangle who he saw for the first time in May of this year.  I requested records from Dr. Lorenda Peck while the patient was in the office .Between Dr. Elnoria Howard and Lorenda Peck patient had numerous laboratory studies (see below).  The patient had his second visit with Dr.  Lorenda Peck in mid June . Basically, Dr. Lorenda Peck doesn't seem convinced the patient has inflammatory bowel disease and there hasn't been any hematologic or structural cause found to suggest etiology of ischemic colitis. Thrombophilia workup was normal. The plan according to the last office note was to obtain a Chromogranin A, a 24-hour urine collection for 5 HIAA, porphyrins, metanephrines and catecholamines and an MR enterography with follow up in mid August. The patient has not had his MR enterography because of scheduling conflicts. Patient tells me that he is on a waiting list to have the study done. Patient continues to complains of frequent episodes of diffuse abdominal pain associated with loose stool. He has these episodes 2 or 3 times a week. Certain foods such as pizza and fast food precipitate episodes. In between these episodes his stools are normal. Denies rectal bleeding or fevers. By our records he has lost about 14 pounds since here in January 2012. In addition to reviewing records from Dr. Elnoria Howard and Dr. Lorenda Peck I reviewed multiple emergency room department records. Over the past several months, at various times, patient has had an elevated white count. He WBC a couple of weeks was 22,000.  The patient frequents emergency department for various reasons including abdominal pain, headaches, groin pain, etc. He has a history of substance abuse and heavy alcohol intake. I did a query report and found that the patient has gotten at least 200 pain tablets since late April. The pain pills have come from various providers but overall it appears to be mainly from emergency  department visits. Patient denies frequent narcotic use. I did talk to him about his frequent narcotic prescriptions, he is not selling the pain medication. Patient tells me he takes 1 or 2 of the pain pills every time he gets a prescription and then discards the rest because he doesn't need it anymore. Patient adamantly denies any illicit  drug use. He has not used cocaine since age 74. He does not drink alcohol.  Laboratory studies from Dr. Elnoria Howard done in Feb 2012:  I do not have all of the lab results. It appears that a Prothrombin II Gene Mutation was ordered but I do not have the results. Factor V Mutation,.[place] ordered but I do not have the results. Protein S Activity and Antigen and Protein C Activity and Antigen were ordered but read "TNP" so I assume that means test not performed.  Antiphospholipid Syndrome evaluation normal, Hepatitis B antibody positive, hepatitis B surface antigen negative, liver profile normal. Antithrombin III elevated at 192.  Laboratory studies from Dr. Lorenda Peck done in May 2012: CBC unremarkable, CMET normal, CRP normal, prothrombin time normal, Derived fibrinogen high at 506, Vitamin B12 normal, IgG normal, IgA normal, IgM low at 35, ANA negative, cryoglobulin negative, C1 Esterase inhibitor normal , cortisol normal, HCVantibody nonreactive, gastrin level normal, vasoactive intestinal peptide normal  Doppler ultrasound of mesenteric vessels done 03/28/2011 by Dr. Lorenda Peck. Results were to be reviewed at patient's next appointment in August.  Past Medical History  Diagnosis Date  . Hepatitis   . Anxiety disorder   . Colitis   . Herpes simplex    Past Surgical History  Procedure Date  . Tendon repair     thumb    reports that he has been smoking.  He does not have any smokeless tobacco history on file. He reports that he does not drink alcohol or use illicit drugs. family history includes Diabetes in his father.  There is no history of Colon cancer. No Known Allergies    Outpatient Encounter Prescriptions as of 05/06/2011  Medication Sig Dispense Refill  . acyclovir (ZOVIRAX) 800 MG tablet One every other two days      . ALPRAZolam (XANAX) 1 MG tablet Take 1 mg by mouth 3 (three) times daily as needed.        . citalopram (CELEXA) 40 MG tablet Take 40 mg by mouth daily.        . Multiple  Vitamin (MULTIVITAMIN) tablet Take 1 tablet by mouth daily.        . hyoscyamine (LEVBID) 0.375 MG 12 hr tablet Take 1 tab twice daily  60 tablet  1     REVIEW OF SYSTEMS  : All other systems reviewed and negative except where noted in the History of Present Illness.   PHYSICAL EXAM: General: Well developed white male in no acute distress Head: Normocephalic and atraumatic Eyes:  sclerae anicteric,conjunctive pink. Ears: Normal auditory acuity Mouth: No deformity or lesions Neck: Supple, no masses.  Lungs: Clear throughout to auscultation Heart: Regular rate and rhythm; no murmurs heard Abdomen: Soft, non distended, nontender. No masses or hepatomegaly noted. Normal Bowel sounds Rectal: Not done Musculoskeletal: Symmetrical with no gross deformities  Skin: No lesions on visible extremities. Multiple upper extremity tattoos Extremities: No edema or deformities noted Neurological: Alert oriented x 4, grossly nonfocal Cervical Nodes:  No significant cervical adenopathy Psychological:  Alert and cooperative. Normal mood and affect  ASSESSMENT AND PLAN;

## 2011-05-07 ENCOUNTER — Telehealth: Payer: Self-pay | Admitting: *Deleted

## 2011-05-07 NOTE — Progress Notes (Signed)
I agree with excellent assessment and plan. 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 I would agree that we have little to offer this patient at this time, and would urge him to followup with [facility] GI evaluation.

## 2011-05-07 NOTE — Telephone Encounter (Signed)
Called the patient to advise that I forgot to put on his instructions for the MR Enterography he is to be NPO 4 hours prior. He is to have nothing by mouth after 8 AM on Saturday 05-10-11.

## 2011-05-10 ENCOUNTER — Other Ambulatory Visit (HOSPITAL_COMMUNITY): Payer: BC Managed Care – PPO

## 2011-05-10 ENCOUNTER — Ambulatory Visit (HOSPITAL_COMMUNITY): Payer: BC Managed Care – PPO

## 2011-05-10 ENCOUNTER — Other Ambulatory Visit: Payer: Self-pay | Admitting: Nurse Practitioner

## 2011-05-10 ENCOUNTER — Ambulatory Visit (HOSPITAL_COMMUNITY)
Admission: RE | Admit: 2011-05-10 | Discharge: 2011-05-10 | Disposition: A | Discharge status: Home / Self Care | Payer: BC Managed Care – PPO | Source: Ambulatory Visit | Attending: Nurse Practitioner | Admitting: Nurse Practitioner

## 2011-05-10 DIAGNOSIS — G8929 Other chronic pain: Secondary | ICD-10-CM | POA: Insufficient documentation

## 2011-05-10 DIAGNOSIS — K5289 Other specified noninfective gastroenteritis and colitis: Secondary | ICD-10-CM | POA: Insufficient documentation

## 2011-05-10 DIAGNOSIS — K529 Noninfective gastroenteritis and colitis, unspecified: Secondary | ICD-10-CM

## 2011-05-10 DIAGNOSIS — R109 Unspecified abdominal pain: Secondary | ICD-10-CM | POA: Insufficient documentation

## 2011-05-10 DIAGNOSIS — R52 Pain, unspecified: Secondary | ICD-10-CM

## 2011-05-13 ENCOUNTER — Telehealth: Payer: Self-pay | Admitting: Nurse Practitioner

## 2011-05-13 ENCOUNTER — Encounter: Payer: Self-pay | Admitting: Podiatry

## 2011-05-13 DIAGNOSIS — L600 Ingrowing nail: Secondary | ICD-10-CM | POA: Insufficient documentation

## 2011-05-13 NOTE — Telephone Encounter (Signed)
Called and left patient a message that I do not have results at this time but will forward his request to Willette Cluster, NP

## 2011-05-15 ENCOUNTER — Telehealth: Payer: Self-pay | Admitting: Nurse Practitioner

## 2011-05-15 ENCOUNTER — Emergency Department (HOSPITAL_COMMUNITY)
Admission: EM | Admit: 2011-05-15 | Discharge: 2011-05-16 | Disposition: A | Discharge status: Home / Self Care | Payer: BC Managed Care – PPO | Attending: Emergency Medicine | Admitting: Emergency Medicine

## 2011-05-15 ENCOUNTER — Emergency Department (HOSPITAL_COMMUNITY): Payer: BC Managed Care – PPO

## 2011-05-15 DIAGNOSIS — M25559 Pain in unspecified hip: Secondary | ICD-10-CM | POA: Insufficient documentation

## 2011-05-15 DIAGNOSIS — L02419 Cutaneous abscess of limb, unspecified: Secondary | ICD-10-CM | POA: Insufficient documentation

## 2011-05-15 DIAGNOSIS — M79609 Pain in unspecified limb: Secondary | ICD-10-CM | POA: Insufficient documentation

## 2011-05-15 DIAGNOSIS — L03119 Cellulitis of unspecified part of limb: Secondary | ICD-10-CM | POA: Insufficient documentation

## 2011-05-15 NOTE — Telephone Encounter (Signed)
Pt aware of results. Pt wants to know if he needs a follow-up appt with Dr. Arlyce Dice. He states he has an appt with Dr. Rudell Cobb at [facility]. Dr. Arlyce Dice please advise.

## 2011-05-15 NOTE — Telephone Encounter (Signed)
Pt is calling wanting MR results, please advise.

## 2011-05-15 NOTE — Telephone Encounter (Signed)
MRI is normal.

## 2011-05-15 NOTE — Telephone Encounter (Signed)
Followup with Dr. Lorenda Peck

## 2011-05-16 MED ORDER — GADOBENATE DIMEGLUMINE 529 MG/ML IV SOLN
20.0000 mL | Freq: Once | INTRAVENOUS | Status: AC | PRN
  Administered 2011-05-16: 20 mL via INTRAVENOUS

## 2011-05-16 NOTE — Telephone Encounter (Signed)
Pt aware.

## 2011-05-16 NOTE — Telephone Encounter (Signed)
Dr. Arlyce Dice reviewed and patient has results

## 2011-06-06 ENCOUNTER — Emergency Department (HOSPITAL_COMMUNITY)
Admission: EM | Admit: 2011-06-06 | Discharge: 2011-06-06 | Disposition: A | Discharge status: Home / Self Care | Payer: BC Managed Care – PPO | Attending: Emergency Medicine | Admitting: Emergency Medicine

## 2011-06-06 DIAGNOSIS — I889 Nonspecific lymphadenitis, unspecified: Secondary | ICD-10-CM | POA: Insufficient documentation

## 2011-06-06 DIAGNOSIS — R1909 Other intra-abdominal and pelvic swelling, mass and lump: Secondary | ICD-10-CM | POA: Insufficient documentation

## 2011-06-06 DIAGNOSIS — A6001 Herpesviral infection of penis: Secondary | ICD-10-CM | POA: Insufficient documentation

## 2011-06-09 ENCOUNTER — Emergency Department (HOSPITAL_COMMUNITY)
Admission: EM | Admit: 2011-06-09 | Discharge: 2011-06-10 | Disposition: A | Discharge status: Home / Self Care | Payer: BC Managed Care – PPO | Attending: Emergency Medicine | Admitting: Emergency Medicine

## 2011-06-09 DIAGNOSIS — R599 Enlarged lymph nodes, unspecified: Secondary | ICD-10-CM | POA: Insufficient documentation

## 2011-06-09 DIAGNOSIS — W219XXA Striking against or struck by unspecified sports equipment, initial encounter: Secondary | ICD-10-CM | POA: Insufficient documentation

## 2011-06-09 DIAGNOSIS — M79609 Pain in unspecified limb: Secondary | ICD-10-CM | POA: Insufficient documentation

## 2011-06-09 DIAGNOSIS — A6001 Herpesviral infection of penis: Secondary | ICD-10-CM | POA: Insufficient documentation

## 2011-06-09 DIAGNOSIS — Y9366 Activity, soccer: Secondary | ICD-10-CM | POA: Insufficient documentation

## 2011-06-09 DIAGNOSIS — Z79899 Other long term (current) drug therapy: Secondary | ICD-10-CM | POA: Insufficient documentation

## 2011-06-09 DIAGNOSIS — S90129A Contusion of unspecified lesser toe(s) without damage to nail, initial encounter: Secondary | ICD-10-CM | POA: Insufficient documentation

## 2011-06-09 DIAGNOSIS — F341 Dysthymic disorder: Secondary | ICD-10-CM | POA: Insufficient documentation

## 2011-06-10 ENCOUNTER — Emergency Department (HOSPITAL_COMMUNITY): Payer: BC Managed Care – PPO

## 2011-07-26 ENCOUNTER — Emergency Department (HOSPITAL_COMMUNITY)
Admission: EM | Admit: 2011-07-26 | Discharge: 2011-07-27 | Disposition: A | Discharge status: Home / Self Care | Payer: BC Managed Care – PPO | Attending: Emergency Medicine | Admitting: Emergency Medicine

## 2011-07-26 DIAGNOSIS — R109 Unspecified abdominal pain: Secondary | ICD-10-CM | POA: Insufficient documentation

## 2011-07-31 ENCOUNTER — Emergency Department (HOSPITAL_COMMUNITY)
Admission: EM | Admit: 2011-07-31 | Discharge: 2011-08-01 | Disposition: A | Discharge status: Home / Self Care | Payer: BC Managed Care – PPO | Attending: Emergency Medicine | Admitting: Emergency Medicine

## 2011-07-31 DIAGNOSIS — R197 Diarrhea, unspecified: Secondary | ICD-10-CM | POA: Insufficient documentation

## 2011-07-31 DIAGNOSIS — R109 Unspecified abdominal pain: Secondary | ICD-10-CM | POA: Insufficient documentation

## 2011-07-31 DIAGNOSIS — K5289 Other specified noninfective gastroenteritis and colitis: Secondary | ICD-10-CM | POA: Insufficient documentation

## 2011-07-31 LAB — POCT I-STAT, CHEM 8
BUN: 12 mg/dL (ref 6–23)
Calcium, Ion: 1.11 mmol/L — ABNORMAL LOW (ref 1.12–1.32)
Chloride: 104 mEq/L (ref 96–112)
Creatinine, Ser: 1 mg/dL (ref 0.50–1.35)
Glucose, Bld: 93 mg/dL (ref 70–99)
HCT: 43 % (ref 39.0–52.0)
Hemoglobin: 14.6 g/dL (ref 13.0–17.0)
Potassium: 3.7 meq/L (ref 3.5–5.1)
Sodium: 142 meq/L (ref 135–145)
TCO2: 25 mmol/L (ref 0–100)

## 2011-07-31 LAB — CBC
MCH: 32 pg (ref 26.0–34.0)
MCHC: 36.1 g/dL — ABNORMAL HIGH (ref 30.0–36.0)
MCV: 88.7 fL (ref 78.0–100.0)
Platelets: 228 10*3/uL (ref 150–400)
RBC: 4.59 MIL/uL (ref 4.22–5.81)

## 2011-07-31 LAB — DIFFERENTIAL
Eosinophils Absolute: 0.2 10*3/uL (ref 0.0–0.7)
Eosinophils Relative: 2 % (ref 0–5)
Lymphs Abs: 3.6 10*3/uL (ref 0.7–4.0)
Monocytes Absolute: 0.8 10*3/uL (ref 0.1–1.0)
Monocytes Relative: 7 % (ref 3–12)

## 2011-08-01 LAB — URINALYSIS, ROUTINE W REFLEX MICROSCOPIC
Bilirubin Urine: NEGATIVE
Ketones, ur: NEGATIVE mg/dL
Nitrite: NEGATIVE
pH: 7 (ref 5.0–8.0)

## 2012-10-22 ENCOUNTER — Emergency Department (HOSPITAL_COMMUNITY)
Admission: EM | Admit: 2012-10-22 | Discharge: 2012-10-22 | Disposition: A | Discharge status: Home / Self Care | Payer: BC Managed Care – PPO | Attending: Emergency Medicine | Admitting: Emergency Medicine

## 2012-10-22 ENCOUNTER — Emergency Department (HOSPITAL_COMMUNITY): Payer: BC Managed Care – PPO

## 2012-10-22 DIAGNOSIS — Z8719 Personal history of other diseases of the digestive system: Secondary | ICD-10-CM | POA: Insufficient documentation

## 2012-10-22 DIAGNOSIS — R110 Nausea: Secondary | ICD-10-CM | POA: Insufficient documentation

## 2012-10-22 DIAGNOSIS — R109 Unspecified abdominal pain: Secondary | ICD-10-CM

## 2012-10-22 DIAGNOSIS — Z8619 Personal history of other infectious and parasitic diseases: Secondary | ICD-10-CM | POA: Insufficient documentation

## 2012-10-22 DIAGNOSIS — F17200 Nicotine dependence, unspecified, uncomplicated: Secondary | ICD-10-CM | POA: Insufficient documentation

## 2012-10-22 DIAGNOSIS — R197 Diarrhea, unspecified: Secondary | ICD-10-CM | POA: Insufficient documentation

## 2012-10-22 DIAGNOSIS — Z79899 Other long term (current) drug therapy: Secondary | ICD-10-CM | POA: Insufficient documentation

## 2012-10-22 DIAGNOSIS — R630 Anorexia: Secondary | ICD-10-CM | POA: Insufficient documentation

## 2012-10-22 DIAGNOSIS — F411 Generalized anxiety disorder: Secondary | ICD-10-CM | POA: Insufficient documentation

## 2012-10-22 LAB — CBC WITH DIFFERENTIAL/PLATELET
Basophils Relative: 0 % (ref 0–1)
Eosinophils Absolute: 0 10*3/uL (ref 0.0–0.7)
HCT: 43.3 % (ref 39.0–52.0)
Hemoglobin: 15.5 g/dL (ref 13.0–17.0)
MCH: 31.6 pg (ref 26.0–34.0)
MCHC: 35.8 g/dL (ref 30.0–36.0)
Monocytes Absolute: 1 10*3/uL (ref 0.1–1.0)
Monocytes Relative: 7 % (ref 3–12)
Neutrophils Relative %: 77 % (ref 43–77)

## 2012-10-22 LAB — URINALYSIS, ROUTINE W REFLEX MICROSCOPIC
Glucose, UA: NEGATIVE mg/dL
Leukocytes, UA: NEGATIVE
pH: 6 (ref 5.0–8.0)

## 2012-10-22 LAB — COMPREHENSIVE METABOLIC PANEL
Albumin: 4.5 g/dL (ref 3.5–5.2)
BUN: 6 mg/dL (ref 6–23)
Creatinine, Ser: 0.86 mg/dL (ref 0.50–1.35)
Total Protein: 7.3 g/dL (ref 6.0–8.3)

## 2012-10-22 MED ORDER — IOHEXOL 300 MG/ML  SOLN
20.0000 mL | INTRAMUSCULAR | Status: AC
  Administered 2012-10-22 (×2): 25 mL via ORAL

## 2012-10-22 MED ORDER — OXYCODONE-ACETAMINOPHEN 5-325 MG PO TABS: ORAL_TABLET | ORAL | Status: DC

## 2012-10-22 MED ORDER — SODIUM CHLORIDE 0.9 % IV BOLUS (SEPSIS)
1000.0000 mL | Freq: Once | INTRAVENOUS | Status: AC
  Administered 2012-10-22: 1000 mL via INTRAVENOUS

## 2012-10-22 MED ORDER — HYDROMORPHONE HCL PF 1 MG/ML IJ SOLN
1.0000 mg | Freq: Once | INTRAMUSCULAR | Status: AC
  Administered 2012-10-22: 1 mg via INTRAVENOUS
  Filled 2012-10-22: qty 1

## 2012-10-22 MED ORDER — IOHEXOL 300 MG/ML  SOLN
100.0000 mL | Freq: Once | INTRAMUSCULAR | Status: AC | PRN
  Administered 2012-10-22: 100 mL via INTRAVENOUS

## 2012-10-22 MED ORDER — ONDANSETRON HCL 4 MG/2ML IJ SOLN
4.0000 mg | Freq: Once | INTRAMUSCULAR | Status: AC
  Administered 2012-10-22: 4 mg via INTRAVENOUS
  Filled 2012-10-22: qty 2

## 2012-10-22 NOTE — ED Notes (Signed)
Patient transported to CT 

## 2012-10-22 NOTE — ED Notes (Signed)
abd pain since yesterday saw his dr  Today and was sent for further tests has hx of colotitis

## 2012-10-22 NOTE — ED Notes (Signed)
CT called.  Pt completed drinking contrast.  Pt states no change in pain with dilaudid.

## 2012-10-22 NOTE — ED Provider Notes (Signed)
Medical screening examination/treatment/procedure(s) were performed by non-physician practitioner and as supervising physician I was immediately available for consultation/collaboration.  Dione Booze, MD 10/22/12 [phone]

## 2012-10-22 NOTE — ED Provider Notes (Signed)
History     CSN: [id number]  Arrival date & time 10/22/12  1110   First MD Initiated Contact with Patient 10/22/12 1126      No chief complaint on file.   (Consider location/radiation/quality/duration/timing/severity/associated sxs/prior treatment) HPI Matthew Carroll is a 27 y.o. male complaining of 10/10 bilateral lower abdominal pain onset yesterday. It is associated with nausea, anorexia and diarrhea. Patient denies fever, emesis, chest pain, change in bladder habits. Patient has past medical history of colitis and was seen by his primary care Dr. Dr. Lynford Humphrey earlier today. PCP sent into the ED for further testing.   Past Medical History  Diagnosis Date  . Hepatitis   . Anxiety disorder   . Colitis   . Herpes simplex     Past Surgical History  Procedure Date  . Tendon repair     thumb    Family History  Problem Relation Age of Onset  . Diabetes Father   . Colon cancer Neg Hx     History  Substance Use Topics  . Smoking status: Current Every Day Smoker  . Smokeless tobacco: Not on file  . Alcohol Use: No      Review of Systems  Constitutional: Positive for appetite change. Negative for fever.  Respiratory: Negative for shortness of breath.   Cardiovascular: Negative for chest pain.  Gastrointestinal: Positive for nausea, abdominal pain and diarrhea. Negative for vomiting.  All other systems reviewed and are negative.    Allergies  Review of patient's allergies indicates no known allergies.  Home Medications   Current Outpatient Rx  Name  Route  Sig  Dispense  Refill  . ACYCLOVIR 800 MG PO TABS      One every other two days         . ALPRAZOLAM 1 MG PO TABS   Oral   Take 1 mg by mouth 3 (three) times daily as needed.           Marland Kitchen CITALOPRAM HYDROBROMIDE 40 MG PO TABS   Oral   Take 40 mg by mouth daily.           Marland Kitchen ONE-DAILY MULTI VITAMINS PO TABS   Oral   Take 1 tablet by mouth daily.             BP 131/76  Pulse 86  Temp 98.4 F  (36.9 C) (Oral)  Resp 16  SpO2 99%  Physical Exam  Nursing note and vitals reviewed. Constitutional: He is oriented to person, place, and time. He appears well-developed and well-nourished. No distress.  HENT:  Head: Normocephalic.  Mouth/Throat: Oropharynx is clear and moist.  Eyes: Conjunctivae normal and EOM are normal. Pupils are equal, round, and reactive to light.  Cardiovascular: Normal rate.   Pulmonary/Chest: Effort normal. No stridor.  Abdominal: Soft.       Hypoactive bowel sounds, tender to palpation of right lower quadrant, Rovsing, psoas, and obturator are negative.  Musculoskeletal: Normal range of motion.  Neurological: He is alert and oriented to person, place, and time.  Psychiatric: He has a normal mood and affect.    ED Course  Procedures (including critical care time)  Labs Reviewed  CBC WITH DIFFERENTIAL - Abnormal; Notable for the following:    WBC 15.8 (*)     Neutro Abs 12.2 (*)     All other components within normal limits  COMPREHENSIVE METABOLIC PANEL - Abnormal; Notable for the following:    Potassium 3.3 (*)     All other  components within normal limits  URINALYSIS, ROUTINE W REFLEX MICROSCOPIC - Abnormal; Notable for the following:    Specific Gravity, Urine 1.004 (*)     All other components within normal limits   Ct Abdomen Pelvis W Contrast  10/22/2012  *RADIOLOGY REPORT*  Clinical Data: Lower abdominal pain.  Nausea.  CT ABDOMEN AND PELVIS WITH CONTRAST  Technique:  Multidetector CT imaging of the abdomen and pelvis was performed following the standard protocol during bolus administration of intravenous contrast.  Contrast: OMNIPAQUE IOHEXOL 300 MG/ML  SOLN  Comparison: CT of the abdomen and pelvis 10/10/2010.  Findings:  Lung Bases: Unremarkable.  Abdomen/Pelvis:  A subcentimeter low attenuation lesion in segment 8 of the liver is too small to definitively characterize, but is statistically likely to represent a small cyst.  The  appearance of the gallbladder, pancreas, spleen, bilateral adrenal glands and bilateral kidneys is unremarkable.  There is no ascites or pneumoperitoneum and no pathologic distension of small bowel.  No definite pathologic lymphadenopathy identified within the abdomen or pelvis.  A retroaortic left renal vein (normal anatomical variant) incidentally noted.  Prostate and urinary bladder are unremarkable in appearance.  The appendix is not confidently identified (possibly surgically absent), however, no gross inflammatory changes are noted around the cecum to suggest the presence of an acute appendicitis at this time.  Musculoskeletal: There are no aggressive appearing lytic or blastic lesions noted in the visualized portions of the skeleton.  IMPRESSION: 1.  No acute findings in the abdomen or pelvis to account for the patient's symptoms. 2.  Incidental findings, as above.   Original Report Authenticated By: Trudie Reed, M.D.      1. Abdominal pain       MDM  Suspicion for appendectomy. Patient made n.p.o. and CT ordered. Patient's pain is controlled with Dilaudid.  Patient has a a mild white count. Blood work are otherwise unremarkable.  CT shows no acute process patient vital signs are stable and he is appropriate for discharge at this time.   Pt verbalized understanding and agrees with care plan. Outpatient follow-up and return precautions given.          Wynetta Emery, PA-C 10/22/12 1557

## 2012-11-26 IMAGING — CT CT HEAD W/O CM
4 of 8 series · 16 of 40 positions shown, 17 images · non-contrast
Comparison: CT of the head performed 02/16/2009, and CT of the head
and cervical spine performed 06/17/2006

CT HEAD

CLINICAL DATA: Felt pop in neck while working out; severe neck
pain.

CT HEAD WITHOUT CONTRAST AND CT CERVICAL SPINE WITHOUT CONTRAST
TECHNIQUE: Multidetector CT imaging of the head and cervical spine
was performed following the standard protocol without intravenous
contrast.  Multiplanar CT image reconstructions of the cervical
spine were also generated.

[Series 4: cervical spine · axial · 0.33mm/px · z∈[-279,-216]mm · 2 of 75 slices shown]
[im 25/75  brain]
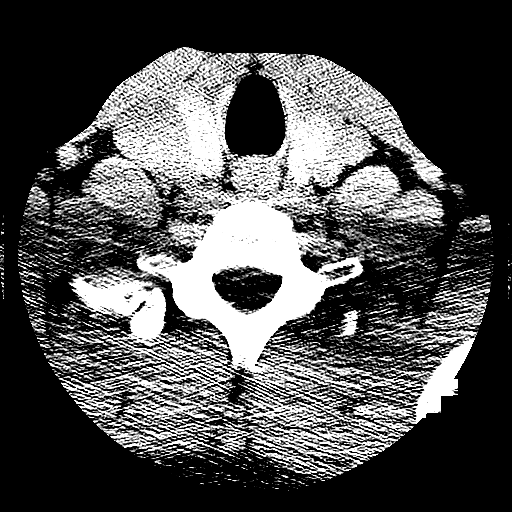
[im 50/75  brain]
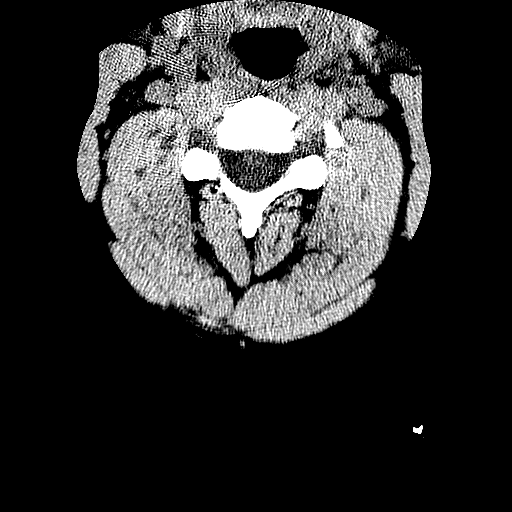

[Series 6: recon 3: cervical spine · axial · 0.33mm/px · z∈[-326,-167]mm · 8 of 294 slices shown]
[im 21/294  brain]
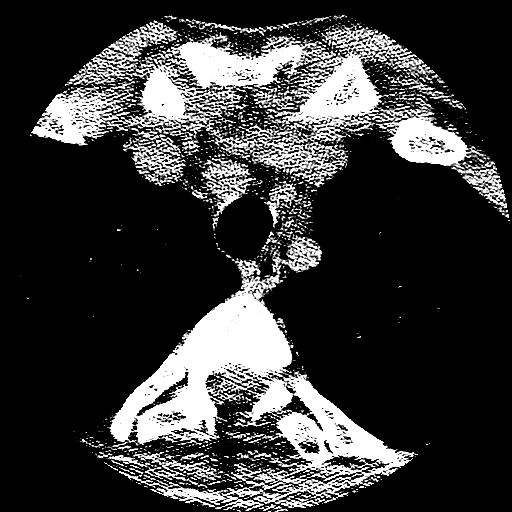
[im 63/294  brain]
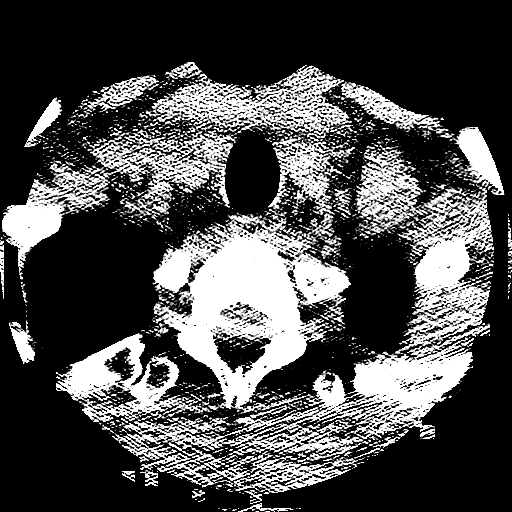
[im 105/294  brain]
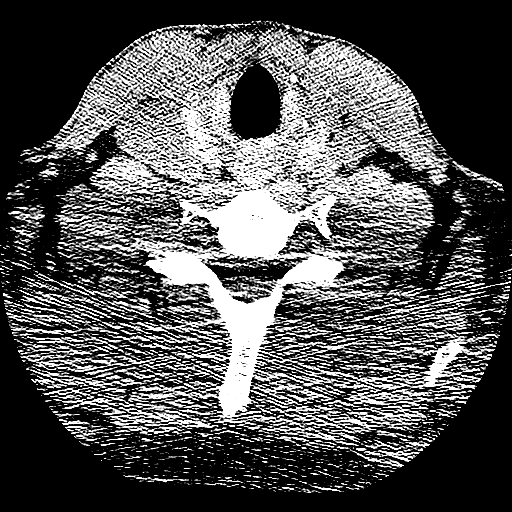
[im 126/294  brain]
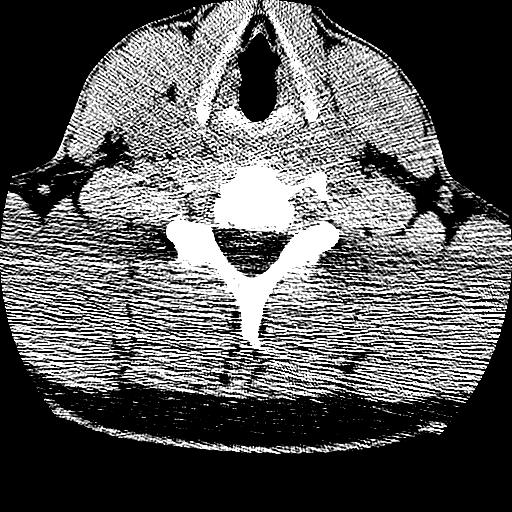
[im 168/294  brain]
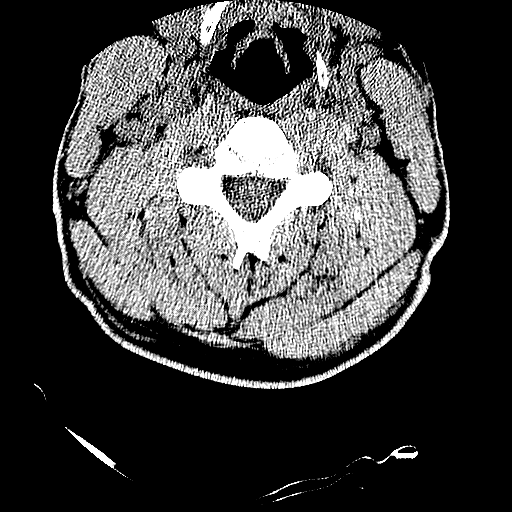
[im 189/294  brain]
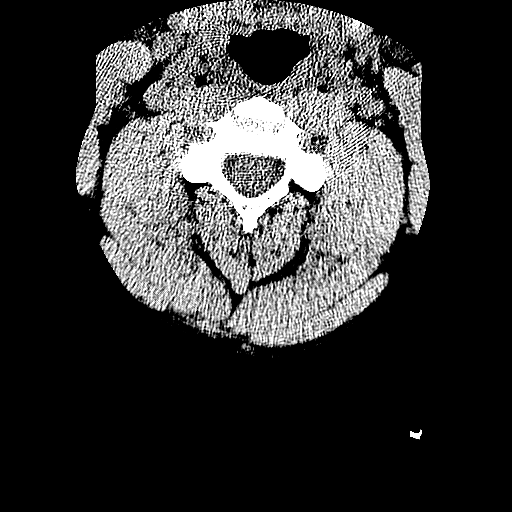
[im 231/294  brain]
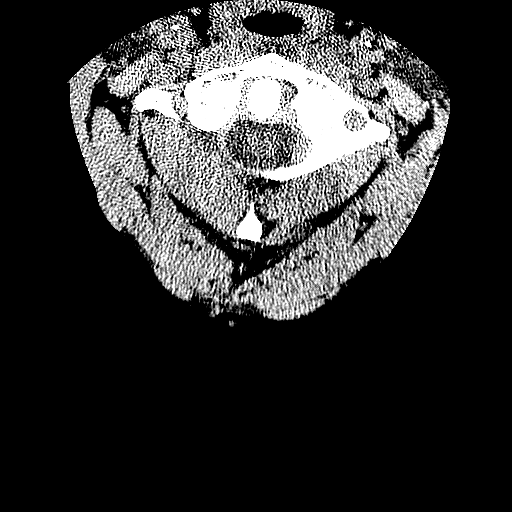
[im 273/294  brain]
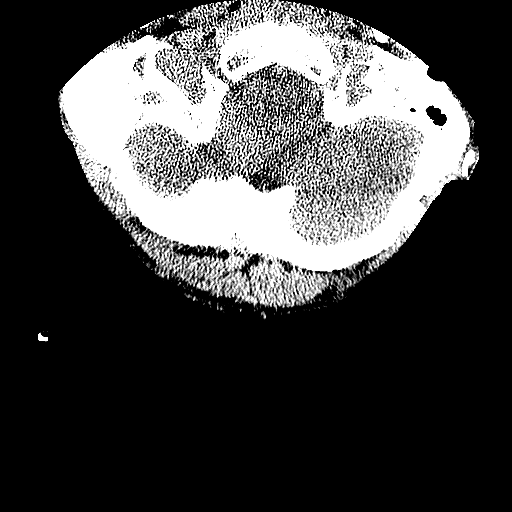

[Series 601: cor · coronal · 0.37mm/px · 3 of 49 slices shown]
[im 13/49  brain]
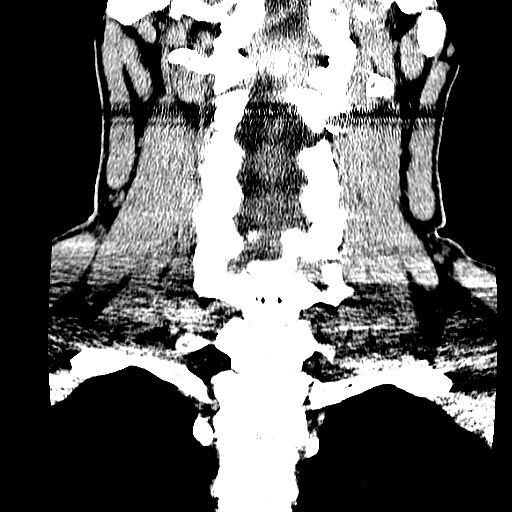
[im 25/49  brain]
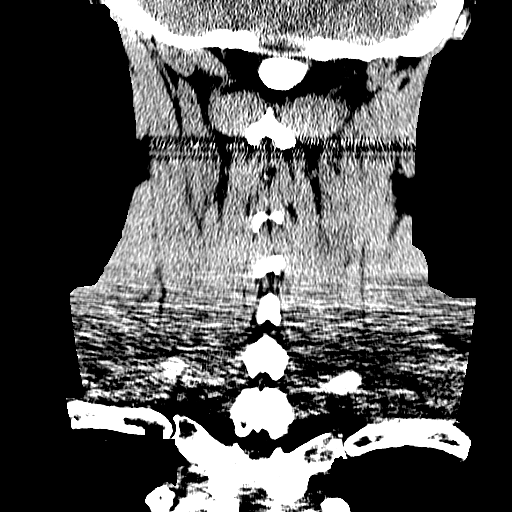
[im 37/49  brain]
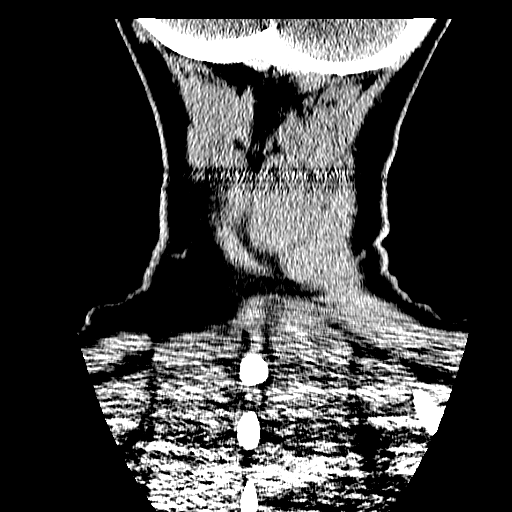

[Series 602: orthog · axial · 0.29mm/px · z∈[-365,-151]mm · 3 of 61 slices shown, 4 images]
[im 1/61  brain]
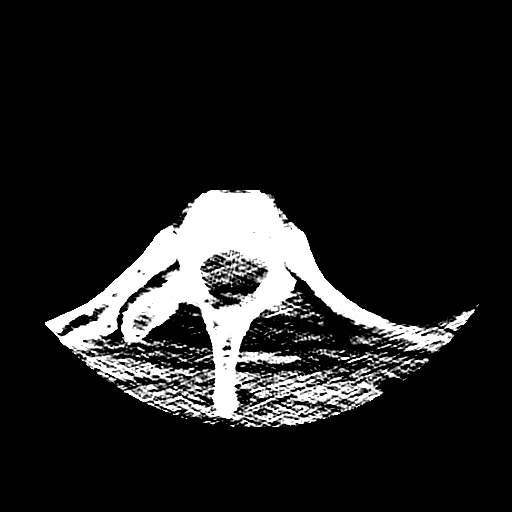
[im 1/61  bone]
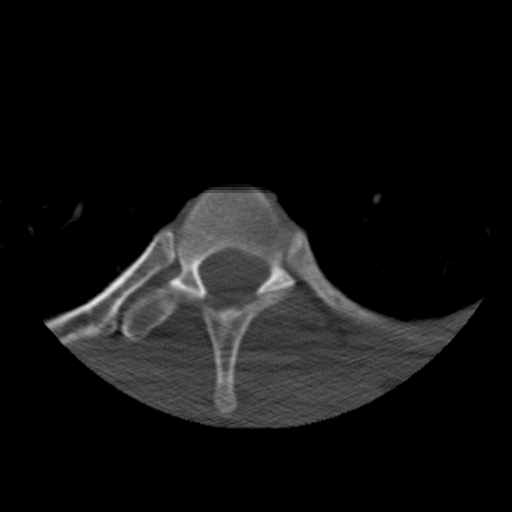
[im 31/61  brain]
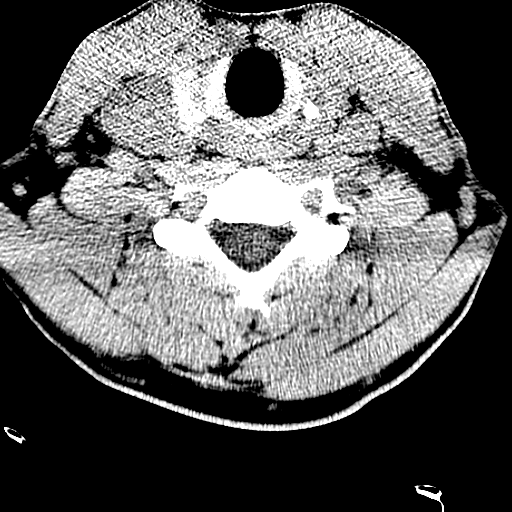
[im 61/61  brain]
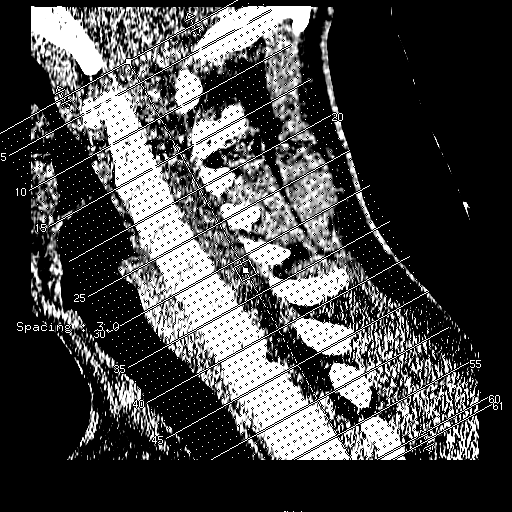

[16 of 40 positions shown; findings below may reference images not displayed]

FINDINGS: There is no evidence of acute infarction, mass lesion, or
intra- or extra-axial hemorrhage on CT.

The posterior fossa, including the cerebellum, brainstem and fourth
ventricle, is within normal limits.  The third and lateral
ventricles, and basal ganglia are unremarkable in appearance.  The
cerebral hemispheres are symmetric in appearance, with normal gray-
white differentiation.  No mass effect or midline shift is seen.

There is no evidence of fracture; visualized osseous structures are
unremarkable in appearance.  The visualized portions of the orbits
are within normal limits.  There is complete opacification of the
right maxillary sinus; the remaining paranasal sinuses and mastoid
air cells are well-aerated.  There is underpneumatization of the
mastoid processes bilaterally.  No significant soft tissue
abnormalities are seen.
IMPRESSION: 1.  No evidence of traumatic intracranial injury or fracture.
2.  Complete opacification of the right maxillary sinus.

CT CERVICAL SPINE
FINDINGS: There is no evidence of fracture or subluxation.
Vertebral bodies demonstrate normal height and alignment.
Intervertebral disc spaces are preserved.  Prevertebral soft
tissues are within normal limits. A small osseous fragment at the
right first costovertebral junction is likely developmental in
nature.  The visualized neural foramina are grossly unremarkable.

The thyroid gland is unremarkable in appearance.  Minimal apparent
emphysematous change is noted at the lung apices.  No significant
soft tissue abnormalities are seen.
IMPRESSION: 1.  No evidence of fracture or subluxation along the cervical
spine.
2.  No definite soft tissue abnormality seen; given the history,
muscle or ligamentous injury would best characterized on MRI, as
deemed clinically appropriate.
3.  Minimal apparent emphysematous change at the lung apices.

## 2012-12-24 ENCOUNTER — Emergency Department (HOSPITAL_COMMUNITY)
Admission: EM | Admit: 2012-12-24 | Discharge: 2012-12-24 | Disposition: A | Discharge status: Home / Self Care | Payer: BC Managed Care – PPO | Attending: Emergency Medicine | Admitting: Emergency Medicine

## 2012-12-24 ENCOUNTER — Emergency Department (HOSPITAL_COMMUNITY): Payer: BC Managed Care – PPO

## 2012-12-24 ENCOUNTER — Encounter (HOSPITAL_COMMUNITY): Payer: Self-pay | Admitting: Cardiology

## 2012-12-24 DIAGNOSIS — IMO0002 Reserved for concepts with insufficient information to code with codable children: Secondary | ICD-10-CM | POA: Insufficient documentation

## 2012-12-24 DIAGNOSIS — Y9389 Activity, other specified: Secondary | ICD-10-CM | POA: Insufficient documentation

## 2012-12-24 DIAGNOSIS — S20219A Contusion of unspecified front wall of thorax, initial encounter: Secondary | ICD-10-CM | POA: Insufficient documentation

## 2012-12-24 DIAGNOSIS — S20212A Contusion of left front wall of thorax, initial encounter: Secondary | ICD-10-CM

## 2012-12-24 DIAGNOSIS — Z79899 Other long term (current) drug therapy: Secondary | ICD-10-CM | POA: Insufficient documentation

## 2012-12-24 DIAGNOSIS — F17200 Nicotine dependence, unspecified, uncomplicated: Secondary | ICD-10-CM | POA: Insufficient documentation

## 2012-12-24 DIAGNOSIS — W1809XA Striking against other object with subsequent fall, initial encounter: Secondary | ICD-10-CM | POA: Insufficient documentation

## 2012-12-24 DIAGNOSIS — Z8719 Personal history of other diseases of the digestive system: Secondary | ICD-10-CM | POA: Insufficient documentation

## 2012-12-24 DIAGNOSIS — Y9289 Other specified places as the place of occurrence of the external cause: Secondary | ICD-10-CM | POA: Insufficient documentation

## 2012-12-24 DIAGNOSIS — F411 Generalized anxiety disorder: Secondary | ICD-10-CM | POA: Insufficient documentation

## 2012-12-24 DIAGNOSIS — Z8619 Personal history of other infectious and parasitic diseases: Secondary | ICD-10-CM | POA: Insufficient documentation

## 2012-12-24 DIAGNOSIS — W010XXA Fall on same level from slipping, tripping and stumbling without subsequent striking against object, initial encounter: Secondary | ICD-10-CM | POA: Insufficient documentation

## 2012-12-24 DIAGNOSIS — Y990 Civilian activity done for income or pay: Secondary | ICD-10-CM | POA: Insufficient documentation

## 2012-12-24 MED ORDER — IBUPROFEN 400 MG PO TABS
600.0000 mg | ORAL_TABLET | Freq: Once | ORAL | Status: AC
  Administered 2012-12-24: 600 mg via ORAL
  Filled 2012-12-24: qty 1

## 2012-12-24 MED ORDER — HYDROCODONE-ACETAMINOPHEN 5-325 MG PO TABS: 2.0000 | ORAL_TABLET | Freq: Four times a day (QID) | ORAL | Status: DC | PRN

## 2012-12-24 NOTE — ED Provider Notes (Signed)
History     CSN: [id number]  Arrival date & time 12/24/12  1100   First MD Initiated Contact with Patient 12/24/12 1157      Chief Complaint  Patient presents with  . Rib Injury    (Consider location/radiation/quality/duration/timing/severity/associated sxs/prior treatment) HPI Comments: Patient was working yesterday and fell onto a box on the left side of his ribs. No loss of consciousness no other complaints outside of the rib pain.  Patient is a 27 y.o. male presenting with chest pain. The history is provided by the patient. No language interpreter was used.  Chest Pain Pain location:  L chest and L lateral chest Pain quality: dull   Pain radiates to:  Does not radiate Pain radiates to the back: no   Pain severity:  Moderate Onset quality:  Sudden Timing:  Constant Progression:  Unchanged Chronicity:  New Context comment:  Had box fall on side Relieved by:  Nothing Worsened by:  Coughing and deep breathing Ineffective treatments:  None tried Associated symptoms: no abdominal pain, no back pain, no dizziness, no headache, no heartburn, no lower extremity edema, no numbness and no orthopnea     Past Medical History  Diagnosis Date  . Hepatitis   . Anxiety disorder   . Colitis   . Herpes simplex     Past Surgical History  Procedure Laterality Date  . Tendon repair      thumb    Family History  Problem Relation Age of Onset  . Diabetes Father   . Colon cancer Neg Hx     History  Substance Use Topics  . Smoking status: Current Every Day Smoker  . Smokeless tobacco: Not on file  . Alcohol Use: No      Review of Systems  Cardiovascular: Positive for chest pain. Negative for orthopnea.  Gastrointestinal: Negative for heartburn and abdominal pain.  Musculoskeletal: Negative for back pain.  Neurological: Negative for dizziness, numbness and headaches.  All other systems reviewed and are negative.    Allergies  Review of patient's allergies indicates  no known allergies.  Home Medications   Current Outpatient Rx  Name  Route  Sig  Dispense  Refill  . acyclovir (ZOVIRAX) 800 MG tablet   Oral   Take 800 mg by mouth every other day.          . ALPRAZolam (XANAX) 1 MG tablet   Oral   Take 1 mg by mouth 3 (three) times daily as needed. For anxiety         . azithromycin (ZITHROMAX) 250 MG tablet   Oral   Take 250-500 mg by mouth daily. Started 1/7 2 tablets on day 1 1 tablet daily for days 2-5         . citalopram (CELEXA) 40 MG tablet   Oral   Take 40 mg by mouth daily.           Marland Kitchen HYDROcodone-acetaminophen (NORCO/VICODIN) 5-325 MG per tablet   Oral   Take 2 tablets by mouth every 6 (six) hours as needed for pain (do not combine with tylenol).   8 tablet   0   . Multiple Vitamin (MULTIVITAMIN WITH MINERALS) TABS   Oral   Take 1 tablet by mouth daily.         Marland Kitchen oxyCODONE-acetaminophen (PERCOCET/ROXICET) 5-325 MG per tablet      1 to 2 tabs PO q6hrs  PRN for pain   15 tablet   0   . predniSONE (DELTASONE) 20  MG tablet   Oral   Take 20 mg by mouth See admin instructions. Total of 15 tablets Started 1/7 1 tablet three times daily for 2 days 1 tablet two times daily for 2 days 1 tablet daily for 5 days           BP 141/54  Pulse 93  Temp(Src) 97.5 F (36.4 C) (Oral)  Resp 18  SpO2 100%  Physical Exam  Constitutional: He is oriented to person, place, and time. He appears well-developed and well-nourished.  HENT:  Head: Normocephalic.  Right Ear: External ear normal.  Left Ear: External ear normal.  Nose: Nose normal.  Mouth/Throat: Oropharynx is clear and moist.  Eyes: EOM are normal. Pupils are equal, round, and reactive to light. Right eye exhibits no discharge. Left eye exhibits no discharge.  Neck: Normal range of motion. Neck supple. No tracheal deviation present.  No nuchal rigidity no meningeal signs  Cardiovascular: Normal rate and regular rhythm.   Pulmonary/Chest: Effort normal and  breath sounds normal. No stridor. No respiratory distress. He has no wheezes. He has no rales. He exhibits tenderness.  Tenderness located over left lower and left lateral ribs. Breath sounds equal bilaterally. no step-offs palpated  Abdominal: Soft. He exhibits no distension and no mass. There is no tenderness. There is no rebound and no guarding.  Musculoskeletal: Normal range of motion. He exhibits no edema and no tenderness.  Neurological: He is alert and oriented to person, place, and time. He has normal reflexes. No cranial nerve deficit. Coordination normal.  Skin: Skin is warm. No rash noted. He is not diaphoretic. No erythema. No pallor.  No pettechia no purpura    ED Course  Procedures (including critical care time)  Labs Reviewed - No data to display Dg Ribs Unilateral W/chest Left  12/24/2012  *RADIOLOGY REPORT*  Clinical Data: Fall with injury to the left ribs.  LEFT RIBS AND CHEST - 3+ VIEW  Comparison: PA and lateral chest 03/18/2011.  Findings: Lungs clear.  Heart size normal.  No pneumothorax or pleural effusion.  No rib fracture is identified.  IMPRESSION: Negative exam.   Original Report Authenticated By: Holley Dexter, M.D.      1. Rib contusion, left, initial encounter       MDM  X-rays obtained to rule out fracture or pneumothorax and return is normal. I will go ahead and give patient ibuprofen for pain relief and give a prescription for Vicodin as needed for breakthrough pain. Patient with no other complaints at this time no abdominal injuries no abdominal wall bruising no back pain no other back bruising. Patient comfortable with plan for discharge home.        Arley Phenix, MD 12/24/12 [phone]

## 2012-12-24 NOTE — ED Notes (Signed)
Pt instructed on ice application and followup with ortho or PMD for continued pain

## 2012-12-24 NOTE — ED Notes (Signed)
Pt reports he was at work and tripped falling on his left side onto his ribs. States he has pain with deep breaths and with palpation. Denies any bruising just some swelling.

## 2012-12-26 ENCOUNTER — Emergency Department (HOSPITAL_COMMUNITY)
Admission: EM | Admit: 2012-12-26 | Discharge: 2012-12-26 | Disposition: A | Discharge status: Home / Self Care | Payer: BC Managed Care – PPO | Attending: Emergency Medicine | Admitting: Emergency Medicine

## 2012-12-26 ENCOUNTER — Encounter (HOSPITAL_COMMUNITY): Payer: Self-pay | Admitting: Family Medicine

## 2012-12-26 ENCOUNTER — Emergency Department (HOSPITAL_COMMUNITY): Payer: BC Managed Care – PPO

## 2012-12-26 DIAGNOSIS — Z79899 Other long term (current) drug therapy: Secondary | ICD-10-CM | POA: Insufficient documentation

## 2012-12-26 DIAGNOSIS — F17200 Nicotine dependence, unspecified, uncomplicated: Secondary | ICD-10-CM | POA: Insufficient documentation

## 2012-12-26 DIAGNOSIS — Y990 Civilian activity done for income or pay: Secondary | ICD-10-CM | POA: Insufficient documentation

## 2012-12-26 DIAGNOSIS — Z8619 Personal history of other infectious and parasitic diseases: Secondary | ICD-10-CM | POA: Insufficient documentation

## 2012-12-26 DIAGNOSIS — S2232XS Fracture of one rib, left side, sequela: Secondary | ICD-10-CM

## 2012-12-26 DIAGNOSIS — Y9289 Other specified places as the place of occurrence of the external cause: Secondary | ICD-10-CM | POA: Insufficient documentation

## 2012-12-26 DIAGNOSIS — S2239XA Fracture of one rib, unspecified side, initial encounter for closed fracture: Secondary | ICD-10-CM | POA: Insufficient documentation

## 2012-12-26 DIAGNOSIS — Y9389 Activity, other specified: Secondary | ICD-10-CM | POA: Insufficient documentation

## 2012-12-26 DIAGNOSIS — F411 Generalized anxiety disorder: Secondary | ICD-10-CM | POA: Insufficient documentation

## 2012-12-26 DIAGNOSIS — Z8719 Personal history of other diseases of the digestive system: Secondary | ICD-10-CM | POA: Insufficient documentation

## 2012-12-26 DIAGNOSIS — W1809XA Striking against other object with subsequent fall, initial encounter: Secondary | ICD-10-CM | POA: Insufficient documentation

## 2012-12-26 MED ORDER — ONDANSETRON 4 MG PO TBDP
4.0000 mg | ORAL_TABLET | Freq: Once | ORAL | Status: AC
  Administered 2012-12-26: 4 mg via ORAL
  Filled 2012-12-26: qty 1

## 2012-12-26 MED ORDER — HYDROCODONE-ACETAMINOPHEN 5-325 MG PO TABS: 1.0000 | ORAL_TABLET | ORAL | Status: DC | PRN

## 2012-12-26 MED ORDER — IBUPROFEN 800 MG PO TABS: 800.0000 mg | ORAL_TABLET | Freq: Once | ORAL | Status: DC

## 2012-12-26 MED ORDER — HYDROCODONE-ACETAMINOPHEN 5-325 MG PO TABS: 1.0000 | ORAL_TABLET | Freq: Three times a day (TID) | ORAL | Status: DC | PRN

## 2012-12-26 MED ORDER — IBUPROFEN 400 MG PO TABS
800.0000 mg | ORAL_TABLET | Freq: Once | ORAL | Status: AC
  Administered 2012-12-26: 800 mg via ORAL
  Filled 2012-12-26: qty 2

## 2012-12-26 MED ORDER — OXYCODONE-ACETAMINOPHEN 5-325 MG PO TABS
2.0000 | ORAL_TABLET | Freq: Once | ORAL | Status: AC
  Administered 2012-12-26: 2 via ORAL
  Filled 2012-12-26: qty 2

## 2012-12-26 NOTE — ED Provider Notes (Signed)
History     CSN: [id number]  Arrival date & time 12/26/12  1246   None     Chief Complaint  Patient presents with  . Rib Injury    (Consider location/radiation/quality/duration/timing/severity/associated sxs/prior treatment) HPI  Patient presents to the emergency department with complaints of left-sided rib pain after falling 2 days ago. He was seen in the ER 2 days ago chest x-ray is normal did not show any rib fractures and given prescription for Vicodin and discharged. He says that since then he feels that there is more swelling and continued pain. She's come back to the emergency department to be reevaluated. Admits to feeling a sharp pain the left lower lung base when he takes in a large breasts. No shortness of breath, wheezing, diaphoresis, vomiting, weakness, chills. Past Medical History  Diagnosis Date  . Hepatitis   . Anxiety disorder   . Colitis   . Herpes simplex     Past Surgical History  Procedure Laterality Date  . Tendon repair      thumb    Family History  Problem Relation Age of Onset  . Diabetes Father   . Colon cancer Neg Hx     History  Substance Use Topics  . Smoking status: Current Every Day Smoker  . Smokeless tobacco: Not on file  . Alcohol Use: No      Review of Systems  All other systems reviewed and are negative.    Allergies  Review of patient's allergies indicates no known allergies.  Home Medications   Current Outpatient Rx  Name  Route  Sig  Dispense  Refill  . acyclovir (ZOVIRAX) 800 MG tablet   Oral   Take 800 mg by mouth every other day.          . ALPRAZolam (XANAX) 1 MG tablet   Oral   Take 1 mg by mouth 3 (three) times daily as needed for anxiety.          . Cholecalciferol (VITAMIN D3) 2000 UNITS capsule   Oral   Take 6,000 Units by mouth daily.         . citalopram (CELEXA) 40 MG tablet   Oral   Take 40 mg by mouth daily.           Marland Kitchen HYDROcodone-acetaminophen (NORCO/VICODIN) 5-325 MG per  tablet   Oral   Take 1 tablet by mouth every 8 (eight) hours as needed for pain.   15 tablet   0   . ibuprofen (ADVIL,MOTRIN) 800 MG tablet   Oral   Take 1 tablet (800 mg total) by mouth once.   30 tablet   0     BP 139/76  Pulse 73  Temp(Src) 98.4 F (36.9 C)  Resp 18  SpO2 100%  Physical Exam  Nursing note and vitals reviewed. Constitutional: He appears well-developed and well-nourished. No distress.  HENT:  Head: Normocephalic and atraumatic.  Eyes: Pupils are equal, round, and reactive to light.  Neck: Normal range of motion. Neck supple.  Cardiovascular: Normal rate and regular rhythm.   Pulmonary/Chest: Effort normal. No accessory muscle usage. Chest wall is not dull to percussion. He exhibits tenderness and bony tenderness. He exhibits no mass, no laceration, no crepitus, no edema, no deformity, no swelling and no retraction.    Abdominal: Soft.  Neurological: He is alert.  Skin: Skin is warm and dry.    ED Course  Procedures (including critical care time)  Labs Reviewed - No data to display  Dg Ribs Unilateral W/chest Left  12/26/2012  *RADIOLOGY REPORT*  Clinical Data: Fall.  Left anterior rib pain.  LEFT RIBS AND CHEST - 3+ VIEW  Comparison: 12/24/2012.  Findings: BB marker overlies the lateral left eighth and ninth ribs.  There is no pneumothorax.  There is a nondisplaced rib fracture of the lateral left eighth rib seen on the oblique view. This is only seen on the single view.  IMPRESSION: Nondisplaced left lateral eighth rib fracture.  No pneumothorax.   Original Report Authenticated By: Andreas Newport, M.D.      1. Rib fracture, left, sequela       MDM  Repeat shows left none displaced rib fracture no pneumothorax. Pts ribs are still painful and breathing makes it worse. He has been advised that it can take up to 2-4 weeks for his ribs to begin not hurting so much. Given refill of medication and Rx for Ibuprofen. Referral to Ortho given.  Pt has been  advised of the symptoms that warrant their return to the ED. Patient has voiced understanding and has agreed to follow-up with the PCP or specialist.         Dorthula Matas, PA-C 12/26/12 1416

## 2012-12-26 NOTE — ED Notes (Signed)
Per pt sts injured left ribs at work o Thursday. sts he fell on a metal piece of a truck.

## 2012-12-26 NOTE — ED Provider Notes (Signed)
Medical screening examination/treatment/procedure(s) were performed by non-physician practitioner and as supervising physician I was immediately available for consultation/collaboration.  Tyffany Waldrop L Annalaura Sauseda, MD 12/26/12 1703 

## 2013-04-27 ENCOUNTER — Encounter (HOSPITAL_COMMUNITY): Payer: Self-pay | Admitting: Emergency Medicine

## 2013-04-27 ENCOUNTER — Emergency Department (HOSPITAL_COMMUNITY): Payer: BC Managed Care – PPO

## 2013-04-27 ENCOUNTER — Emergency Department (HOSPITAL_COMMUNITY)
Admission: EM | Admit: 2013-04-27 | Discharge: 2013-04-27 | Disposition: A | Discharge status: Home / Self Care | Payer: BC Managed Care – PPO | Attending: Emergency Medicine | Admitting: Emergency Medicine

## 2013-04-27 DIAGNOSIS — R071 Chest pain on breathing: Secondary | ICD-10-CM | POA: Insufficient documentation

## 2013-04-27 DIAGNOSIS — Z8619 Personal history of other infectious and parasitic diseases: Secondary | ICD-10-CM | POA: Insufficient documentation

## 2013-04-27 DIAGNOSIS — Z8719 Personal history of other diseases of the digestive system: Secondary | ICD-10-CM | POA: Insufficient documentation

## 2013-04-27 DIAGNOSIS — R0781 Pleurodynia: Secondary | ICD-10-CM

## 2013-04-27 DIAGNOSIS — F17200 Nicotine dependence, unspecified, uncomplicated: Secondary | ICD-10-CM | POA: Insufficient documentation

## 2013-04-27 DIAGNOSIS — Z79899 Other long term (current) drug therapy: Secondary | ICD-10-CM | POA: Insufficient documentation

## 2013-04-27 DIAGNOSIS — F411 Generalized anxiety disorder: Secondary | ICD-10-CM | POA: Insufficient documentation

## 2013-04-27 DIAGNOSIS — Z9181 History of falling: Secondary | ICD-10-CM | POA: Insufficient documentation

## 2013-04-27 MED ORDER — OXYCODONE-ACETAMINOPHEN 5-325 MG PO TABS
2.0000 | ORAL_TABLET | Freq: Once | ORAL | Status: AC
  Administered 2013-04-27: 2 via ORAL
  Filled 2013-04-27: qty 2

## 2013-04-27 MED ORDER — OXYCODONE-ACETAMINOPHEN 5-325 MG PO TABS: 1.0000 | ORAL_TABLET | ORAL | Status: DC | PRN

## 2013-04-27 NOTE — ED Provider Notes (Signed)
History  This chart was scribed for Glade Nurse, PA-C working with Ashby Dawes, MD by Greggory Stallion, ED scribe. This patient was seen in room TR10C/TR10C and the patient's care was started at 8:43 PM.  CSN: [id number] Arrival date & time 04/27/13  2003   Chief Complaint  Patient presents with  . Shortness of Breath   The history is provided by the patient. No language interpreter was used.    HPI Comments: Matthew Carroll is a 27 y.o. male who presents to the Emergency Department complaining of gradual onset, waxing and waning sharp left rib pain that started 3 months ago when he fractured his left lower ribs. He rates his pain 7/10. Pt states he slipped and fell at work 3 months ago and the pain will not go away. He states he was seeing Dr. Prince Rome and his last visit was 6 weeks ago. Pt states Dr. Prince Rome was giving him percocet with some relief. He states he hasn't been able to make it to his office for an appointment to get a refill. He states he has an appointment with Dr. Prince Rome on Tuesday. Pt denies cough, hemoptysis, recent travel, fever, has no history of cancer or thromboemoblism. Smokes cigarettes daily.   Past Medical History  Diagnosis Date  . Hepatitis   . Anxiety disorder   . Colitis   . Herpes simplex    Past Surgical History  Procedure Laterality Date  . Tendon repair      thumb   Family History  Problem Relation Age of Onset  . Diabetes Father   . Colon cancer Neg Hx    History  Substance Use Topics  . Smoking status: Current Every Day Smoker  . Smokeless tobacco: Not on file  . Alcohol Use: No    Review of Systems  HENT: Negative for neck stiffness.   Eyes: Negative for visual disturbance.  Respiratory: Negative for apnea and chest tightness.   Cardiovascular: Negative for palpitations.  Gastrointestinal: Negative for nausea, diarrhea and constipation.  Genitourinary: Negative for dysuria.  Musculoskeletal: Negative for gait problem.  Neurological:  Negative for dizziness, weakness, light-headedness and numbness.  Skin: No rash, no wound   Allergies  Review of patient's allergies indicates no known allergies.  Home Medications   Current Outpatient Rx  Name  Route  Sig  Dispense  Refill  . acyclovir (ZOVIRAX) 800 MG tablet   Oral   Take 800 mg by mouth every other day.          . ALPRAZolam (XANAX) 1 MG tablet   Oral   Take 1 mg by mouth 3 (three) times daily as needed for anxiety.          . Cholecalciferol (VITAMIN D3) 2000 UNITS capsule   Oral   Take 6,000 Units by mouth daily.         . citalopram (CELEXA) 40 MG tablet   Oral   Take 40 mg by mouth daily.           . predniSONE (DELTASONE) 20 MG tablet   Oral   Take 20-60 mg by mouth daily.          BP 123/70  Pulse 91  Temp(Src) 98.3 F (36.8 C) (Oral)  Resp 16  SpO2 100%  Physical Exam  Nursing note and vitals reviewed. Constitutional: He is oriented to person, place, and time. He appears well-developed and well-nourished. No distress.  HENT:  Head: Normocephalic and atraumatic.  Eyes: Conjunctivae and EOM  are normal.  Neck: Normal range of motion. Neck supple.  No meningeal signs  Cardiovascular: Normal rate, regular rhythm and normal heart sounds.  Exam reveals no gallop and no friction rub.   No murmur heard. Pulmonary/Chest: Effort normal and breath sounds normal. No respiratory distress. He has no wheezes. He has no rales. He exhibits no tenderness.  Abdominal: Soft. Bowel sounds are normal. He exhibits no distension. There is no tenderness. There is no rebound and no guarding.  Musculoskeletal: Normal range of motion. He exhibits no edema and no tenderness.  Neurological: He is alert and oriented to person, place, and time. No cranial nerve deficit.  Skin: Skin is warm and dry. He is not diaphoretic. No erythema.    ED Course  Procedures (including critical care time) DIAGNOSTIC STUDIES: Oxygen Saturation is 100% on RA, normal by my  interpretation.    COORDINATION OF CARE: 8:57 PM-Discussed treatment plan which includes chest xray with pt at bedside and pt agreed to plan.   Labs Reviewed - No data to display Dg Ribs Unilateral W/chest Left  04/27/2013   *RADIOLOGY REPORT*  Clinical Data: Left anterior lateral rib pain.  History of prior rib fractures.  LEFT RIBS AND CHEST - 3+ VIEW  Comparison: 12/26/2012  Findings: Chest radiograph demonstrates clear lungs.  No evidence for a pneumothorax. Heart and mediastinum are within normal limits. There are old or healing fractures of the left sixth and seventh ribs. These fractures have callus formation.  No evidence for a new left rib fracture.  IMPRESSION: Old or healing fractures of the left sixth and seventh ribs.  No evidence for an acute rib fracture.  No acute chest findings.   Original Report Authenticated By: Richarda Overlie, M.D.   1. Rib pain on left side     MDM  Pt denies a history of travel, immobilization, surgery, fevers, cancer, hormone use, swelling of the legs. The patient has no history of venous thromboembolism. Pt had a high level of anxiety regarding his mending ribs and what he called short of breath and inability to take a deep breath secondary to pain. Imaging offered the reassurance that the rib is indeed healing, there is no new injury, and that both lungs are functioning well (no pneumo). Will send pt home on some pain meds and direct pt to keep his appointment with his primary doctor. Discussed reasons to seek immediate care. Patient expresses understanding and agrees with plan.  I personally performed the services described in this documentation, which was scribed in my presence. The recorded information has been reviewed and is accurate.    Glade Nurse, PA-C 04/29/13 1232  Glade Nurse, PA-C 04/29/13 1315

## 2013-04-27 NOTE — ED Notes (Signed)
Patient with chronic left rib pain.  Patient had accident at work three months ago, continues with pain and some mild shortness of breath.  Patient states that he would like a ct scan to find out why he continues with pain in the area.

## 2013-04-30 NOTE — ED Provider Notes (Signed)
Medical screening examination/treatment/procedure(s) were performed by non-physician practitioner and as supervising physician I was immediately available for consultation/collaboration.   Bernd Crom Ann Demetris Capell, MD 04/30/13 0818 

## 2013-10-27 DIAGNOSIS — E785 Hyperlipidemia, unspecified: Secondary | ICD-10-CM | POA: Insufficient documentation

## 2013-10-27 DIAGNOSIS — E559 Vitamin D deficiency, unspecified: Secondary | ICD-10-CM | POA: Insufficient documentation

## 2013-10-27 DIAGNOSIS — F988 Other specified behavioral and emotional disorders with onset usually occurring in childhood and adolescence: Secondary | ICD-10-CM | POA: Insufficient documentation

## 2013-10-28 ENCOUNTER — Ambulatory Visit: Payer: Self-pay | Admitting: Internal Medicine

## 2013-11-25 ENCOUNTER — Other Ambulatory Visit: Payer: Self-pay | Admitting: Internal Medicine

## 2014-01-24 ENCOUNTER — Other Ambulatory Visit: Payer: Self-pay | Admitting: Emergency Medicine

## 2014-02-24 ENCOUNTER — Other Ambulatory Visit: Payer: Self-pay | Admitting: Internal Medicine

## 2014-02-24 ENCOUNTER — Other Ambulatory Visit: Payer: Self-pay | Admitting: Physician Assistant

## 2014-02-27 ENCOUNTER — Telehealth: Payer: Self-pay | Admitting: *Deleted

## 2014-02-27 NOTE — Telephone Encounter (Signed)
REFILL = XANAX ASKING IF WE CAN CALL IN ENOUGH MED TILL HIS APPT Thursday ?

## 2014-02-27 NOTE — Telephone Encounter (Signed)
Unfortunately NO. He will have to wait until his appointment with his missed appoinments and our difficuty contacting him and its a controlled substance.

## 2014-03-09 ENCOUNTER — Encounter: Payer: Self-pay | Admitting: Emergency Medicine

## 2014-03-09 ENCOUNTER — Ambulatory Visit (INDEPENDENT_AMBULATORY_CARE_PROVIDER_SITE_OTHER): Payer: Self-pay | Admitting: Emergency Medicine

## 2014-03-09 VITALS — BP 110/60 | HR 92 | Temp 98.0°F | Resp 16 | Ht 69.0 in | Wt 169.0 lb

## 2014-03-09 DIAGNOSIS — F411 Generalized anxiety disorder: Secondary | ICD-10-CM

## 2014-03-09 MED ORDER — ALPRAZOLAM 1 MG PO TABS: ORAL_TABLET | ORAL | Status: DC

## 2014-03-09 MED ORDER — CITALOPRAM HYDROBROMIDE 40 MG PO TABS: ORAL_TABLET | ORAL | Status: DC

## 2014-03-09 NOTE — Patient Instructions (Signed)

## 2014-03-09 NOTE — Progress Notes (Signed)
   Subjective:    Patient ID: Matthew Carroll, male    DOB: 06-24-1986, 28 y.o.   MRN: [id number]  HPI Comments: 28 yo here for medication refill for anxiety. He notes anxiety is controlled with Celexa and Xanax. He notes he is working at [employer] and wants to train for [employer] to  [employer] and make more money. He notes he is doing well overall.   He has had abnormal blood work in past but has not had recheck due to time and money. He notes he was sick when labs were drawn and feels better now.   Anxiety Symptoms include nervous/anxious behavior.        Medication List       This list is accurate as of: 03/09/14  2:54 PM.  Always use your most recent med list.               acyclovir 800 MG tablet  Commonly known as:  ZOVIRAX  TAKE 1 TABLET BY MOUTH 3 TIMES A DAY FOR HERPES     ALPRAZolam 1 MG tablet  Commonly known as:  XANAX  TAKE 1 TABLET BY MOUTH 3 TIMES A DAY AS NEEDED     citalopram 40 MG tablet  Commonly known as:  CELEXA  TAKE 1 TABLET BY MOUTH EVERY DAY FOR MOOD     Vitamin D3 2000 UNITS capsule  Take 6,000 Units by mouth daily.       No Known Allergies Past Medical History  Diagnosis Date  . Hepatitis   . Anxiety disorder   . Colitis   . Herpes simplex   . ADD (attention deficit disorder)   . Hyperlipidemia   . Vitamin D deficiency      Review of Systems  Psychiatric/Behavioral: The patient is nervous/anxious.   All other systems reviewed and are negative.  BP 110/60  Pulse 92  Temp(Src) 98 F (36.7 C) (Temporal)  Resp 16  Ht 5\' 9"  (1.753 m)  Wt 169 lb (76.658 kg)  BMI 24.95 kg/m2     Objective:   Physical Exam  Nursing note and vitals reviewed. Constitutional: He is oriented to person, place, and time. He appears well-developed and well-nourished.  HENT:  Head: Normocephalic and atraumatic.  Right Ear: External ear normal.  Left Ear: External ear normal.  Nose: Nose normal.  Mouth/Throat: Oropharynx is clear and moist.   Eyes: Conjunctivae are normal. Pupils are equal, round, and reactive to light.  Neck: Normal range of motion.  Cardiovascular: Normal rate, regular rhythm, normal heart sounds and intact distal pulses.   Pulmonary/Chest: Effort normal and breath sounds normal.  Abdominal: Soft.  Musculoskeletal: Normal range of motion.  Lymphadenopathy:    He has no cervical adenopathy.  Neurological: He is alert and oriented to person, place, and time.  Skin: Skin is warm and dry.  Psychiatric: He has a normal mood and affect. Judgment normal.          Assessment & Plan:  1. 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 Suggest counseling.  2. Abnormal lab 1/ 2014 without f/u-Declines labs today will come back once "gets insurance straightened out"

## 2014-04-07 ENCOUNTER — Other Ambulatory Visit: Payer: Self-pay | Admitting: Emergency Medicine

## 2014-04-07 NOTE — Telephone Encounter (Signed)
Called into pharmacy

## 2014-05-05 ENCOUNTER — Other Ambulatory Visit: Payer: Self-pay | Admitting: Emergency Medicine

## 2014-05-05 ENCOUNTER — Other Ambulatory Visit: Payer: Self-pay | Admitting: Internal Medicine

## 2014-05-05 MED ORDER — HYDROXYZINE HCL 25 MG PO TABS: ORAL_TABLET | ORAL | Status: DC

## 2014-05-08 ENCOUNTER — Encounter: Payer: Self-pay | Admitting: Internal Medicine

## 2014-05-08 NOTE — Progress Notes (Signed)
Patient ID: Matthew MiniumDavid W Carroll, male   DOB: [birth year], 28 y.o.   MRN: [id number]   Stephenie Acres O    S H O W

## 2014-05-09 ENCOUNTER — Encounter: Payer: Self-pay | Admitting: Internal Medicine

## 2014-05-11 ENCOUNTER — Encounter (HOSPITAL_COMMUNITY): Payer: Self-pay | Admitting: Emergency Medicine

## 2014-05-11 DIAGNOSIS — M7989 Other specified soft tissue disorders: Secondary | ICD-10-CM | POA: Insufficient documentation

## 2014-05-11 DIAGNOSIS — F411 Generalized anxiety disorder: Secondary | ICD-10-CM | POA: Insufficient documentation

## 2014-05-11 DIAGNOSIS — Z862 Personal history of diseases of the blood and blood-forming organs and certain disorders involving the immune mechanism: Secondary | ICD-10-CM | POA: Insufficient documentation

## 2014-05-11 DIAGNOSIS — Z8639 Personal history of other endocrine, nutritional and metabolic disease: Secondary | ICD-10-CM | POA: Insufficient documentation

## 2014-05-11 DIAGNOSIS — R609 Edema, unspecified: Secondary | ICD-10-CM | POA: Insufficient documentation

## 2014-05-11 DIAGNOSIS — Z8719 Personal history of other diseases of the digestive system: Secondary | ICD-10-CM | POA: Insufficient documentation

## 2014-05-11 DIAGNOSIS — E559 Vitamin D deficiency, unspecified: Secondary | ICD-10-CM | POA: Insufficient documentation

## 2014-05-11 DIAGNOSIS — Z79899 Other long term (current) drug therapy: Secondary | ICD-10-CM | POA: Insufficient documentation

## 2014-05-11 DIAGNOSIS — Z8619 Personal history of other infectious and parasitic diseases: Secondary | ICD-10-CM | POA: Insufficient documentation

## 2014-05-11 DIAGNOSIS — F17200 Nicotine dependence, unspecified, uncomplicated: Secondary | ICD-10-CM | POA: Insufficient documentation

## 2014-05-11 LAB — CBC WITH DIFFERENTIAL/PLATELET
BASOS ABS: 0 10*3/uL (ref 0.0–0.1)
BASOS PCT: 0 % (ref 0–1)
EOS ABS: 0.2 10*3/uL (ref 0.0–0.7)
Eosinophils Relative: 2 % (ref 0–5)
HCT: 39.2 % (ref 39.0–52.0)
HEMOGLOBIN: 13.4 g/dL (ref 13.0–17.0)
Lymphocytes Relative: 35 % (ref 12–46)
Lymphs Abs: 3.4 10*3/uL (ref 0.7–4.0)
MCH: 31.5 pg (ref 26.0–34.0)
MCHC: 34.2 g/dL (ref 30.0–36.0)
MCV: 92 fL (ref 78.0–100.0)
MONO ABS: 0.7 10*3/uL (ref 0.1–1.0)
MONOS PCT: 7 % (ref 3–12)
NEUTROS ABS: 5.6 10*3/uL (ref 1.7–7.7)
NEUTROS PCT: 56 % (ref 43–77)
Platelets: 174 10*3/uL (ref 150–400)
RBC: 4.26 MIL/uL (ref 4.22–5.81)
RDW: 13.4 % (ref 11.5–15.5)
WBC: 9.9 10*3/uL (ref 4.0–10.5)

## 2014-05-11 LAB — COMPREHENSIVE METABOLIC PANEL
ALBUMIN: 3.8 g/dL (ref 3.5–5.2)
ALT: 19 U/L (ref 0–53)
ANION GAP: 12 (ref 5–15)
AST: 28 U/L (ref 0–37)
Alkaline Phosphatase: 49 U/L (ref 39–117)
BUN: 8 mg/dL (ref 6–23)
CO2: 25 mEq/L (ref 19–32)
CREATININE: 0.85 mg/dL (ref 0.50–1.35)
Calcium: 8.8 mg/dL (ref 8.4–10.5)
Chloride: 104 mEq/L (ref 96–112)
GFR calc Af Amer: >90 mL/min (ref >90)
GFR calc non Af Amer: >90 mL/min (ref >90)
Glucose, Bld: 91 mg/dL (ref 70–99)
Potassium: 4 mEq/L (ref 3.7–5.3)
Sodium: 141 mEq/L (ref 137–147)
TOTAL PROTEIN: 6.6 g/dL (ref 6.0–8.3)
Total Bilirubin: <0.2 mg/dL — ABNORMAL LOW (ref 0.3–1.2)

## 2014-05-11 NOTE — ED Notes (Signed)
Presents with bilateral lower leg swelling began yesterday afternoon, +1 pitting edema. Also reports pain. Denies SO and chest pain

## 2014-05-12 ENCOUNTER — Emergency Department (HOSPITAL_COMMUNITY)
Admission: EM | Admit: 2014-05-12 | Discharge: 2014-05-12 | Disposition: A | Discharge status: Home / Self Care | Payer: BC Managed Care – PPO | Attending: Emergency Medicine | Admitting: Emergency Medicine

## 2014-05-12 DIAGNOSIS — R609 Edema, unspecified: Secondary | ICD-10-CM

## 2014-05-12 MED ORDER — ACETAMINOPHEN 325 MG PO TABS
650.0000 mg | ORAL_TABLET | Freq: Once | ORAL | Status: DC
  Filled 2014-05-12: qty 2

## 2014-05-12 NOTE — ED Provider Notes (Signed)
Medical screening examination/treatment/procedure(s) were performed by non-physician practitioner and as supervising physician I was immediately available for consultation/collaboration.   Dsean Aurorah Schlachter, MD 05/12/14 0504 

## 2014-05-12 NOTE — ED Notes (Signed)
Pt refused ted hoses and tylenol at discharge. Pt stating "I am not happy with the care given to me.  I feel like the lady who came in did not care about me."  This RN attempted to talk pt into receiving ted hoses and explain the benefits of wearing hoses.  Pt still insistent on leaving.  Pt refused discharge paperwork and refused to sign on signature pad.

## 2014-05-12 NOTE — Discharge Instructions (Signed)
Peripheral Edema You have swelling in your legs (peripheral edema). This swelling is due to excess accumulation of salt and water in your body. Edema may be a sign of heart, kidney or liver disease, or a side effect of a medication. It may also be due to problems in the leg veins. Elevating your legs and using special support stockings may be very helpful, if the cause of the swelling is due to poor venous circulation. Avoid long periods of standing, whatever the cause. Treatment of edema depends on identifying the cause. Chips, pretzels, pickles and other salty foods should be avoided. Restricting salt in your diet is almost always needed. Water pills (diuretics) are often used to remove the excess salt and water from your body via urine. These medicines prevent the kidney from reabsorbing sodium. This increases urine flow. Diuretic treatment may also result in lowering of potassium levels in your body. Potassium supplements may be needed if you have to use diuretics daily. Daily weights can help you keep track of your progress in clearing your edema. You should call your caregiver for follow up care as recommended. SEEK IMMEDIATE MEDICAL CARE IF:   You have increased swelling, pain, redness, or heat in your legs.  You develop shortness of breath, especially when lying down.  You develop chest or abdominal pain, weakness, or fainting.  You have a fever. Document Released: 11/06/2004 Document Revised: 12/22/2011 Document Reviewed: 10/17/2009 [facility] Patient Information 2015 [employer] [place]. This information is not intended to replace advice given to you by your health care provider. Make sure you discuss any questions you have with your health care provider. You can try wearing support socks

## 2014-05-12 NOTE — ED Provider Notes (Signed)
CSN: [id number]     Arrival date & time 05/11/14  1924 History   First MD Initiated Contact with Patient 05/12/14 0037     Chief Complaint  Patient presents with  . Leg Swelling     (Consider location/radiation/quality/duration/timing/severity/associated sxs/prior Treatment) HPI Comments: This is a 28 year old male with 12 hours of bilateral lower, leg swelling.  That is recurrent in nature, stating, 3 or 4 weeks, ago.  He had similar presentation.  That lasted 2 or 3 days.  He, states he's been trying to elevate his legs without resolution.  Denies any shortness of chest pain, trauma, recent travel.   The history is provided by the patient.    Past Medical History  Diagnosis Date  . Hepatitis   . Anxiety disorder   . Colitis   . Herpes simplex   . ADD (attention deficit disorder)   . Hyperlipidemia   . Vitamin D deficiency    Past Surgical History  Procedure Laterality Date  . Tendon repair      thumb   Family History  Problem Relation Age of Onset  . Diabetes Father   . Colon cancer Neg Hx    History  Substance Use Topics  . Smoking status: Current Every Day Smoker  . Smokeless tobacco: Not on file  . Alcohol Use: No    Review of Systems  Constitutional: Negative for fever and chills.  Respiratory: Negative for shortness of breath.   Cardiovascular: Positive for leg swelling. Negative for chest pain.  Gastrointestinal: Negative for vomiting and diarrhea.  Genitourinary: Negative for decreased urine volume.  Musculoskeletal: Negative for arthralgias and myalgias.  Skin: Negative for rash.  Neurological: Negative for dizziness, weakness and numbness.  Hematological: Does not bruise/bleed easily.  All other systems reviewed and are negative.     Allergies  Review of patient's allergies indicates no known allergies.  Home Medications   Prior to Admission medications   Medication Sig Start Date End Date Taking? Authorizing Provider  acyclovir (ZOVIRAX) 800  MG tablet Take 800 mg by mouth daily as needed. Patient states he just takes it PRN to keep it in his system   Yes Historical Provider, MD  Cholecalciferol (VITAMIN D3) 2000 UNITS capsule Take 6,000 Units by mouth daily.   Yes Historical Provider, MD  citalopram (CELEXA) 40 MG tablet Take 40 mg by mouth daily.   Yes Historical Provider, MD  LORazepam (ATIVAN) 1 MG tablet Take 0.5 mg by mouth daily as needed for anxiety.   Yes Historical Provider, MD   BP 119/57  Pulse 57  Temp(Src) 98.5 F (36.9 C) (Oral)  Resp 14  SpO2 98% Physical Exam  Nursing note and vitals reviewed. Constitutional: He appears well-developed and well-nourished. No distress.  HENT:  Head: Normocephalic.  Eyes: Pupils are equal, round, and reactive to light.  Neck: Normal range of motion.  Cardiovascular: Normal rate and regular rhythm.   Pulmonary/Chest: Effort normal and breath sounds normal.  Abdominal: Soft. He exhibits no distension. There is no tenderness.  Musculoskeletal: Normal range of motion. He exhibits edema and tenderness.       Legs: Edema   Neurological: He is alert.  Skin: Skin is warm.    ED Course  Procedures (including critical care time) Labs Review Labs Reviewed  COMPREHENSIVE METABOLIC PANEL - Abnormal; Notable for the following:    Total Bilirubin <0.2 (*)    All other components within normal limits  CBC WITH DIFFERENTIAL    Imaging Review No results  found.   EKG Interpretation None      MDM  Patient has been placed in TED hose and referred back to his primary care physician Final diagnoses:  Peripheral edema         Arman Filter, NP 05/12/14 0116

## 2014-05-17 ENCOUNTER — Ambulatory Visit: Payer: Self-pay | Admitting: Internal Medicine

## 2014-05-30 ENCOUNTER — Encounter: Payer: Self-pay | Admitting: Internal Medicine

## 2014-06-05 ENCOUNTER — Other Ambulatory Visit: Payer: Self-pay | Admitting: Physician Assistant

## 2014-06-27 ENCOUNTER — Emergency Department (HOSPITAL_COMMUNITY)
Admission: EM | Admit: 2014-06-27 | Discharge: 2014-06-27 | Disposition: A | Discharge status: Home / Self Care | Payer: BC Managed Care – PPO | Attending: Emergency Medicine | Admitting: Emergency Medicine

## 2014-06-27 ENCOUNTER — Encounter (HOSPITAL_COMMUNITY): Payer: Self-pay | Admitting: Emergency Medicine

## 2014-06-27 ENCOUNTER — Emergency Department (HOSPITAL_COMMUNITY): Payer: BC Managed Care – PPO

## 2014-06-27 DIAGNOSIS — S6991XA Unspecified injury of right wrist, hand and finger(s), initial encounter: Secondary | ICD-10-CM

## 2014-06-27 DIAGNOSIS — F17200 Nicotine dependence, unspecified, uncomplicated: Secondary | ICD-10-CM | POA: Insufficient documentation

## 2014-06-27 DIAGNOSIS — E559 Vitamin D deficiency, unspecified: Secondary | ICD-10-CM | POA: Insufficient documentation

## 2014-06-27 DIAGNOSIS — F411 Generalized anxiety disorder: Secondary | ICD-10-CM | POA: Insufficient documentation

## 2014-06-27 DIAGNOSIS — Z8719 Personal history of other diseases of the digestive system: Secondary | ICD-10-CM | POA: Insufficient documentation

## 2014-06-27 DIAGNOSIS — Z79899 Other long term (current) drug therapy: Secondary | ICD-10-CM | POA: Insufficient documentation

## 2014-06-27 DIAGNOSIS — IMO0002 Reserved for concepts with insufficient information to code with codable children: Secondary | ICD-10-CM | POA: Insufficient documentation

## 2014-06-27 DIAGNOSIS — Y9289 Other specified places as the place of occurrence of the external cause: Secondary | ICD-10-CM | POA: Insufficient documentation

## 2014-06-27 DIAGNOSIS — Y9389 Activity, other specified: Secondary | ICD-10-CM | POA: Insufficient documentation

## 2014-06-27 DIAGNOSIS — Z8619 Personal history of other infectious and parasitic diseases: Secondary | ICD-10-CM | POA: Insufficient documentation

## 2014-06-27 DIAGNOSIS — S6990XA Unspecified injury of unspecified wrist, hand and finger(s), initial encounter: Secondary | ICD-10-CM | POA: Insufficient documentation

## 2014-06-27 MED ORDER — HYDROMORPHONE HCL PF 1 MG/ML IJ SOLN
1.0000 mg | Freq: Once | INTRAMUSCULAR | Status: AC
  Administered 2014-06-27: 1 mg via INTRAMUSCULAR
  Filled 2014-06-27: qty 1

## 2014-06-27 MED ORDER — HYDROCODONE-ACETAMINOPHEN 5-325 MG PO TABS: 1.0000 | ORAL_TABLET | Freq: Four times a day (QID) | ORAL | Status: DC | PRN

## 2014-06-27 MED ORDER — HYDROCODONE-ACETAMINOPHEN 5-325 MG PO TABS
2.0000 | ORAL_TABLET | Freq: Once | ORAL | Status: AC
  Administered 2014-06-27: 2 via ORAL
  Filled 2014-06-27: qty 2

## 2014-06-27 NOTE — ED Notes (Signed)
Pt requesting we do not place splint at this time, wants to wait until the pain medicine has an opportunity to take effect.

## 2014-06-27 NOTE — ED Provider Notes (Signed)
CSN: [id number]     Arrival date & time 06/27/14  0315 History   First MD Initiated Contact with Patient 06/27/14 0405     Chief Complaint  Patient presents with  . Hand Pain     (Consider location/radiation/quality/duration/timing/severity/associated sxs/prior Treatment) The history is provided by the patient.  CAIDEN ARTEAGA is a 28 y.o. male hx of ADD, HL, hepatitis here with R hand injury. Was angry at someone and punched a wall with R hand. Denies other injuries. Noticed right hand swelling afterwards.    Past Medical History  Diagnosis Date  . Hepatitis   . Anxiety disorder   . Colitis   . Herpes simplex   . ADD (attention deficit disorder)   . Hyperlipidemia   . Vitamin D deficiency    Past Surgical History  Procedure Laterality Date  . Tendon repair      thumb   Family History  Problem Relation Age of Onset  . Diabetes Father   . Colon cancer Neg Hx    History  Substance Use Topics  . Smoking status: Current Every Day Smoker  . Smokeless tobacco: Not on file  . Alcohol Use: No    Review of Systems  Musculoskeletal:       R hand pain   All other systems reviewed and are negative.     Allergies  Review of patient's allergies indicates no known allergies.  Home Medications   Prior to Admission medications   Medication Sig Start Date End Date Taking? Authorizing Provider  acyclovir (ZOVIRAX) 800 MG tablet Take 800 mg by mouth daily as needed. Patient states he just takes it PRN to keep it in his system    Historical Provider, MD  ALPRAZolam Prudy Feeler) 1 MG tablet TAKE 1 TABLET BY MOUTH 3 TIMES A DAY 06/05/14   Quentin Mulling, PA-C  Cholecalciferol (VITAMIN D3) 2000 UNITS capsule Take 6,000 Units by mouth daily.    Historical Provider, MD  citalopram (CELEXA) 40 MG tablet Take 40 mg by mouth daily.    Historical Provider, MD  LORazepam (ATIVAN) 1 MG tablet Take 0.5 mg by mouth daily as needed for anxiety.    Historical Provider, MD   BP 113/67  Pulse 84   Temp(Src) 98.6 F (37 C)  Resp 16  Ht  (1.753 m)  Wt 169 lb (76.658 kg)  BMI 24.95 kg/m2  SpO2 100% Physical Exam  Nursing note and vitals reviewed. Constitutional: He is oriented to person, place, and time.  Uncomfortable   HENT:  Head: Normocephalic.  Eyes: Pupils are equal, round, and reactive to light.  Neck: Normal range of motion.  Cardiovascular: Normal rate.   Pulmonary/Chest: Effort normal.  Abdominal: Soft.  Musculoskeletal:  R hand swollen 4th and 5th metacarpals. R 4th finger flexed and unable to fully extend. 2+ pulses, sensation intact. Good capillary refill.   Neurological: He is alert and oriented to person, place, and time.  Skin: Skin is warm and dry.  Psychiatric: He has a normal mood and affect. His behavior is normal. Judgment and thought content normal.    ED Course  Procedures (including critical care time) Labs Review Labs Reviewed - No data to display  Imaging Review Dg Hand Complete Right  06/27/2014   CLINICAL DATA:  Punched brick wall, with right hand swelling, particularly at the fourth and fifth metacarpals.  EXAM: RIGHT HAND - COMPLETE 3+ VIEW  COMPARISON:  Right hand radiographs performed 09/27/2010  FINDINGS: There is no evidence of  fracture or dislocation. Minimal degenerative change is noted at the base of the fifth metacarpal. The joint spaces are preserved; diffuse dorsal soft tissue swelling is noted. The carpal rows are intact, and demonstrate normal alignment.  IMPRESSION: No evidence of fracture or dislocation.   Electronically Signed   By: Roanna Raider M.D.   On: 06/27/2014 03:59     EKG Interpretation None      MDM   Final diagnoses:  None    KENDYN ZAMAN is a 28 y.o. male here with R hand injury. Xray showed no obvious fracture. R 4th finger flexed, concerned for possible tendon injury. Will splint in extension and give wrist splint for comfort. Will d/c home with pain meds and hand f/u.      Richardean Canal,  MD 06/27/14 [phone]

## 2014-06-27 NOTE — ED Notes (Signed)
To ED via EMS for eval of right hand pain after punching a wall

## 2014-06-27 NOTE — Discharge Instructions (Signed)
Use finger splint at all times.   Use wrist splint for comfort.   Take motrin for pain. Take vicodin for severe pain. Do NOT drive with it.   Follow up with hand doctor.   Return to ER if you have worse hand pain, swelling.

## 2014-07-04 ENCOUNTER — Other Ambulatory Visit: Payer: Self-pay | Admitting: Physician Assistant

## 2014-07-06 ENCOUNTER — Other Ambulatory Visit: Payer: Self-pay

## 2014-07-06 ENCOUNTER — Other Ambulatory Visit: Payer: Self-pay | Admitting: Internal Medicine

## 2014-07-06 MED ORDER — ALPRAZOLAM 1 MG PO TABS: 1.0000 mg | ORAL_TABLET | Freq: Three times a day (TID) | ORAL | Status: DC | PRN

## 2014-07-07 ENCOUNTER — Encounter: Payer: Self-pay | Admitting: Internal Medicine

## 2014-07-07 ENCOUNTER — Ambulatory Visit (INDEPENDENT_AMBULATORY_CARE_PROVIDER_SITE_OTHER): Payer: Self-pay | Admitting: Internal Medicine

## 2014-07-07 VITALS — BP 116/62 | HR 76 | Temp 98.4°F | Resp 16 | Ht 69.0 in | Wt 178.4 lb

## 2014-07-07 DIAGNOSIS — F411 Generalized anxiety disorder: Secondary | ICD-10-CM

## 2014-07-07 DIAGNOSIS — R197 Diarrhea, unspecified: Secondary | ICD-10-CM

## 2014-07-07 DIAGNOSIS — G8929 Other chronic pain: Secondary | ICD-10-CM

## 2014-07-07 DIAGNOSIS — R109 Unspecified abdominal pain: Secondary | ICD-10-CM

## 2014-07-07 DIAGNOSIS — R51 Headache: Secondary | ICD-10-CM

## 2014-07-07 DIAGNOSIS — G4700 Insomnia, unspecified: Secondary | ICD-10-CM

## 2014-07-07 MED ORDER — TRAZODONE HCL 150 MG PO TABS: ORAL_TABLET | ORAL | Status: DC

## 2014-07-07 NOTE — Progress Notes (Signed)
Patient ID: Matthew Carroll, male   DOB: 08-22-1986, 28 y.o.   MRN: [id number]   This very nice 27 y.o.MWM presents for 3 month follow up with ADD, IBS, Elev Lipids (TGs), Anxiety, Vit D Deficiency, Hx/o oxycodone addiction,Hx/o Hepatitis  And Herpes. Patient relate visit to [facility] ER about 10 days ago with a hand injury and XR was negative.   Today's BP was 116/62 mmHg. Patient denies any chest pain, palpitations, dyspnea/orthopnea/PND, dizziness, claudication, or dependent edema.   Hyperlipidemia is managed (TC 184-TG  122 - HDL 46 - LDL 114 in Aug 2013 with diet.   Also, the patient has been screened forPreDiabetes and patient denies any symptoms of reactive hypoglycemia, diabetic polys, paresthesias or visual blurring.  Last A1c was 5.3% in Aug 2013.    Further, Patient has history of Vitamin D Deficiency and patient supplements vitamin D without any suspected side-effects. Last vitamin D was very low at 37  In Aug 2013.   Medication List   acyclovir 800 MG tablet  Commonly known as:  ZOVIRAX  Take 800 mg by mouth daily as needed (to keep in system). Patient states he just takes it PRN to keep it in his system     ALPRAZolam 1 MG tablet  Commonly known as:  XANAX  Take 1 tablet (1 mg total) by mouth 3 (three) times daily as needed for anxiety.     citalopram 40 MG tablet  Commonly known as:  CELEXA  Take 40 mg by mouth daily.     GOODYS EXTRA STRENGTH PO  Take 2 packets by mouth every 6 (six) hours as needed (pain).     traZODone 150 MG tablet  Commonly known as:  DESYREL  Take 1/3 (50 mg)    to     1/2 (75 mg)      to      1 tablet (150 mg)  1 hour before sleep     Vitamin D3 2000 UNITS capsule  Take 6,000 Units by mouth daily.     No Known Allergies  PMHx:   Past Medical History  Diagnosis Date  . Hepatitis   . Anxiety disorder   . Colitis   . Herpes simplex   . ADD (attention deficit disorder)   . Hyperlipidemia   . Vitamin D deficiency    FHx:    Reviewed /  unchanged SHx:    Reviewed / unchanged  Systems Review:  Constitutional: Denies fever, chills, wt changes, headaches, insomnia, fatigue, night sweats, change in appetite. Eyes: Denies redness, blurred vision, diplopia, discharge, itchy, watery eyes.  ENT: Denies discharge, congestion, post nasal drip, epistaxis, sore throat, earache, hearing loss, dental pain, tinnitus, vertigo, sinus pain, snoring.  CV: Denies chest pain, palpitations, irregular heartbeat, syncope, dyspnea, diaphoresis, orthopnea, PND, claudication or edema. Respiratory: denies cough, dyspnea, DOE, pleurisy, hoarseness, laryngitis, wheezing.  Gastrointestinal: Denies dysphagia, odynophagia, heartburn, reflux, water brash, abdominal pain or cramps, nausea, vomiting, bloating, diarrhea, constipation, hematemesis, melena, hematochezia  or hemorrhoids. Genitourinary: Denies dysuria, frequency, urgency, nocturia, hesitancy, discharge, hematuria or flank pain. Musculoskeletal: Denies arthralgias, myalgias, stiffness, jt. swelling, pain, limping or strain/sprain.  Skin: Denies pruritus, rash, hives, warts, acne, eczema or change in skin lesion(s). Neuro: No weakness, tremor, incoordination, spasms, paresthesia or pain. Psychiatric: Denies confusion, memory loss or sensory loss. Endo: Denies change in weight, skin or hair change.  Heme/Lymph: No excessive bleeding, bruising or enlarged lymph nodes.  Exam:  BP 116/62  Pulse 76  Temp(Src) 98.4 F (36.9  C) (Temporal)  Resp 16  Ht  (1.753 m)  Wt 178 lb 6.4 oz (80.922 kg)  BMI 26.33 kg/m2  Appears well nourished and in no distress. Eyes: PERRLA, EOMs, conjunctiva no swelling or erythema. Sinuses: No frontal/maxillary tenderness ENT/Mouth: EAC's clear, TM's nl w/o erythema, bulging. Nares clear w/o erythema, swelling, exudates. Oropharynx clear without erythema or exudates. Oral hygiene is good. Tongue normal, non obstructing. Hearing intact.  Neck: Supple. Thyroid nl. Car  2+/2+ without bruits, nodes or JVD. Chest: Respirations nl with BS clear & equal w/o rales, rhonchi, wheezing or stridor.  Cor: Heart sounds normal w/ regular rate and rhythm without sig. murmurs, gallops, clicks, or rubs. Peripheral pulses normal and equal  without edema.  Abdomen: Soft & bowel sounds normal. Non-tender w/o guarding, rebound, hernias, masses, or organomegaly.  Lymphatics: Unremarkable.  Musculoskeletal: Full ROM all peripheral extremities, joint stability, 5/5 strength, and normal gait.  Skin: Warm, dry without exposed rashes, lesions or ecchymosis apparent.  Neuro: Cranial nerves intact, reflexes equal bilaterally. Sensory-motor testing grossly intact. Tendon reflexes grossly intact.  Pysch: Alert & oriented x 3.  Insight and judgement nl & appropriate. No ideations.  Assessment and Plan:  1. Headache - continue non narcotic mana==  2. Hyperlipidemia - Continue diet, exercise,& lifestyle modifications. Continue monitor periodic cholesterol/liver & renal functions   3. Vitamin D Deficiency - Continue supplementation.  Recommended regular exercise, BP monitoring, weight control, and discussed med and SE's. Recommended labs to assess and monitor clinical status. Further disposition pending results of labs.

## 2014-07-12 ENCOUNTER — Emergency Department (HOSPITAL_COMMUNITY)
Admission: EM | Admit: 2014-07-12 | Discharge: 2014-07-12 | Discharge status: Against Medical Advice | Payer: BC Managed Care – PPO | Attending: Emergency Medicine | Admitting: Emergency Medicine

## 2014-07-12 ENCOUNTER — Encounter (HOSPITAL_COMMUNITY): Payer: Self-pay | Admitting: Emergency Medicine

## 2014-07-12 DIAGNOSIS — Z8719 Personal history of other diseases of the digestive system: Secondary | ICD-10-CM | POA: Diagnosis not present

## 2014-07-12 DIAGNOSIS — E559 Vitamin D deficiency, unspecified: Secondary | ICD-10-CM | POA: Diagnosis not present

## 2014-07-12 DIAGNOSIS — Z79899 Other long term (current) drug therapy: Secondary | ICD-10-CM | POA: Insufficient documentation

## 2014-07-12 DIAGNOSIS — F419 Anxiety disorder, unspecified: Secondary | ICD-10-CM

## 2014-07-12 DIAGNOSIS — F411 Generalized anxiety disorder: Secondary | ICD-10-CM | POA: Diagnosis present

## 2014-07-12 DIAGNOSIS — Z862 Personal history of diseases of the blood and blood-forming organs and certain disorders involving the immune mechanism: Secondary | ICD-10-CM | POA: Diagnosis not present

## 2014-07-12 DIAGNOSIS — Z8639 Personal history of other endocrine, nutritional and metabolic disease: Secondary | ICD-10-CM | POA: Insufficient documentation

## 2014-07-12 DIAGNOSIS — Z8619 Personal history of other infectious and parasitic diseases: Secondary | ICD-10-CM | POA: Insufficient documentation

## 2014-07-12 DIAGNOSIS — F17200 Nicotine dependence, unspecified, uncomplicated: Secondary | ICD-10-CM | POA: Diagnosis not present

## 2014-07-12 LAB — CBC
HCT: 44.8 % (ref 39.0–52.0)
HEMOGLOBIN: 15.5 g/dL (ref 13.0–17.0)
MCH: 31.3 pg (ref 26.0–34.0)
MCHC: 34.6 g/dL (ref 30.0–36.0)
MCV: 90.3 fL (ref 78.0–100.0)
Platelets: 219 10*3/uL (ref 150–400)
RBC: 4.96 MIL/uL (ref 4.22–5.81)
RDW: 13.6 % (ref 11.5–15.5)
WBC: 7.4 10*3/uL (ref 4.0–10.5)

## 2014-07-12 LAB — COMPREHENSIVE METABOLIC PANEL
ALT: 14 U/L (ref 0–53)
ANION GAP: 15 (ref 5–15)
AST: 22 U/L (ref 0–37)
Albumin: 4 g/dL (ref 3.5–5.2)
Alkaline Phosphatase: 69 U/L (ref 39–117)
BUN: 4 mg/dL — AB (ref 6–23)
CO2: 22 mEq/L (ref 19–32)
CREATININE: 0.77 mg/dL (ref 0.50–1.35)
Calcium: 9.1 mg/dL (ref 8.4–10.5)
Chloride: 106 mEq/L (ref 96–112)
GFR calc Af Amer: >90 mL/min (ref >90)
GFR calc non Af Amer: >90 mL/min (ref >90)
GLUCOSE: 89 mg/dL (ref 70–99)
Potassium: 3.9 mEq/L (ref 3.7–5.3)
Sodium: 143 mEq/L (ref 137–147)
Total Bilirubin: 0.2 mg/dL — ABNORMAL LOW (ref 0.3–1.2)
Total Protein: 7.2 g/dL (ref 6.0–8.3)

## 2014-07-12 LAB — ETHANOL: ALCOHOL ETHYL (B): 115 mg/dL — AB (ref 0–11)

## 2014-07-12 MED ORDER — ONDANSETRON HCL 4 MG PO TABS: 4.0000 mg | ORAL_TABLET | Freq: Three times a day (TID) | ORAL | Status: DC | PRN

## 2014-07-12 MED ORDER — THIAMINE HCL 100 MG/ML IJ SOLN: 100.0000 mg | Freq: Every day | INTRAMUSCULAR | Status: DC

## 2014-07-12 MED ORDER — LORAZEPAM 1 MG PO TABS: 1.0000 mg | ORAL_TABLET | Freq: Three times a day (TID) | ORAL | Status: DC | PRN

## 2014-07-12 MED ORDER — NICOTINE 21 MG/24HR TD PT24: 21.0000 mg | MEDICATED_PATCH | Freq: Every day | TRANSDERMAL | Status: DC

## 2014-07-12 MED ORDER — ALUM & MAG HYDROXIDE-SIMETH 200-200-20 MG/5ML PO SUSP: 30.0000 mL | ORAL | Status: DC | PRN

## 2014-07-12 MED ORDER — IBUPROFEN 200 MG PO TABS: 600.0000 mg | ORAL_TABLET | Freq: Three times a day (TID) | ORAL | Status: DC | PRN

## 2014-07-12 MED ORDER — VITAMIN B-1 100 MG PO TABS: 100.0000 mg | ORAL_TABLET | Freq: Every day | ORAL | Status: DC

## 2014-07-12 MED ORDER — ACETAMINOPHEN 325 MG PO TABS
650.0000 mg | ORAL_TABLET | ORAL | Status: DC | PRN
Start: 2014-07-12 — End: 2014-07-12

## 2014-07-12 NOTE — ED Notes (Signed)
Bed: WA03 Expected date:  Expected time:  Means of arrival:  Comments: 

## 2014-07-12 NOTE — ED Notes (Signed)
Not in rm. Will process AMA.

## 2014-07-12 NOTE — ED Notes (Signed)
Not in rm 

## 2014-07-12 NOTE — ED Notes (Signed)
Pt states that he has PTSD from very bad things that have happened in the past, he states he has been taking xanax for 6 years and feels like its not working, he wants something different. Pt states that he's been having nightmares and waking up in the middle of the night. Pt denies suicidal thoughts.

## 2014-07-12 NOTE — ED Notes (Signed)
Pt not in room.

## 2014-07-12 NOTE — ED Notes (Signed)
Pt given a blanket and something to drink

## 2014-07-12 NOTE — ED Notes (Signed)
Pt currently not in room. 

## 2014-07-12 NOTE — ED Provider Notes (Signed)
CSN: [id number]     Arrival date & time 07/12/14  0319 History   First MD Initiated Contact with Patient 07/12/14 [phone]     Chief Complaint  Patient presents with  . Anxiety     (Consider location/radiation/quality/duration/timing/severity/associated sxs/prior Treatment) HPI Matthew Carroll is a 28 y.o. male with PMH of PTSD, Anxiety presenting with anxiety. Patient was escorted to ED by police due to domestic call from wife. Patient states he was threatening violence but did not act on it. He was angry and threatened lashing out. No actions. Patient has been taking xanax for 6 years and it is not working. He wants to discontinue it completely. He has not taken in for the past 3 days. He has had seizure before after discontinuing. Patient reports diaphoresis now but no other symptoms. Patient interested in Detox. Patient denies SI, HI, self-harm. Patient saw Dr. Anne FuMckoewn On Friday, who recommended slowly decreasing ativan but patient refused. Patient not actively hallucinating in ED. Patient denies other drug use. He reports drinking 3 40s last night.   Past Medical History  Diagnosis Date  . Hepatitis   . Anxiety disorder   . Colitis   . Herpes simplex   . ADD (attention deficit disorder)   . Hyperlipidemia   . Vitamin D deficiency    Past Surgical History  Procedure Laterality Date  . Tendon repair      thumb   Family History  Problem Relation Age of Onset  . Diabetes Father   . Colon cancer Neg Hx    History  Substance Use Topics  . Smoking status: Current Every Day Smoker  . Smokeless tobacco: Not on file  . Alcohol Use: No    Review of Systems  Constitutional: Negative for fever and chills.  HENT: Negative for congestion and rhinorrhea.   Eyes: Negative for visual disturbance.  Respiratory: Negative for cough and shortness of breath.   Cardiovascular: Negative for chest pain and palpitations.  Gastrointestinal: Negative for nausea, vomiting and diarrhea.   Genitourinary: Negative for dysuria and hematuria.  Musculoskeletal: Negative for back pain and gait problem.  Skin: Negative for rash.  Neurological: Negative for weakness and headaches.      Allergies  Review of patient's allergies indicates no known allergies.  Home Medications   Prior to Admission medications   Medication Sig Start Date End Date Taking? Authorizing Provider  acyclovir (ZOVIRAX) 800 MG tablet Take 800 mg by mouth daily as needed (to keep in system). Patient states he just takes it PRN to keep it in his system   Yes Historical Provider, MD  ALPRAZolam Prudy Feeler(XANAX) 1 MG tablet Take 1 tablet (1 mg total) by mouth 3 (three) times daily as needed for anxiety. 07/06/14  Yes Quentin MullingAmanda Collier, PA-C  Aspirin-Acetaminophen-Caffeine (GOODYS EXTRA STRENGTH PO) Take 2 packets by mouth every 6 (six) hours as needed (pain).   Yes Historical Provider, MD  Cholecalciferol (VITAMIN D3) 2000 UNITS capsule Take 6,000 Units by mouth daily.   Yes Historical Provider, MD  citalopram (CELEXA) 40 MG tablet Take 40 mg by mouth daily.   Yes Historical Provider, MD  traZODone (DESYREL) 150 MG tablet Take 1/3 (50 mg)    to     1/2 (75 mg)      to      1 tablet (150 mg)  1 hour before sleep 07/07/14   Lucky CowboyWilliam McKeown, MD   BP 135/78  Pulse 77  Temp(Src) 98.5 F (36.9 C) (Oral)  Resp 18  SpO2 100% Physical Exam  Nursing note and vitals reviewed. Constitutional: He appears well-developed and well-nourished. No distress.  HENT:  Head: Normocephalic and atraumatic.  Eyes: Conjunctivae and EOM are normal. Right eye exhibits no discharge. Left eye exhibits no discharge.  Cardiovascular: Normal rate, regular rhythm and normal heart sounds.   Pulmonary/Chest: Effort normal and breath sounds normal. No respiratory distress. He has no wheezes.  Abdominal: Soft. Bowel sounds are normal. He exhibits no distension. There is no tenderness.  Musculoskeletal:  Normal gait.   Neurological: He is alert. He  exhibits normal muscle tone. Coordination normal.  Patient moves all four extremities without any focal neurological deficits and has no facial droop. Normal gait.   Skin: Skin is warm and dry. He is not diaphoretic.    ED Course  Procedures (including critical care time) Labs Review Labs Reviewed  COMPREHENSIVE METABOLIC PANEL - Abnormal; Notable for the following:    BUN 4 (*)    Total Bilirubin 0.2 (*)    All other components within normal limits  ETHANOL - Abnormal; Notable for the following:    Alcohol, Ethyl (B) 115 (*)    All other components within normal limits  CBC  URINE RAPID DRUG SCREEN (HOSP PERFORMED)    Imaging Review No results found.   EKG Interpretation None     Meds given in ED:  Medications  alum & mag hydroxide-simeth (MAALOX/MYLANTA) 200-200-20 MG/5ML suspension 30 mL (not administered)  ondansetron (ZOFRAN) tablet 4 mg (not administered)  nicotine (NICODERM CQ - dosed in mg/24 hours) patch 21 mg (not administered)  acetaminophen (TYLENOL) tablet 650 mg (not administered)  ibuprofen (ADVIL,MOTRIN) tablet 600 mg (not administered)  LORazepam (ATIVAN) tablet 1 mg (not administered)  thiamine (VITAMIN B-1) tablet 100 mg (not administered)    Or  thiamine (B-1) injection 100 mg (not administered)    Discharge Medication List as of 07/12/2014  8:29 AM        MDM   Final diagnoses:  Anxiety   Patient has been medically cleared in the ED and is awaiting consult by TTS team for possible placement vs OP clinic information. Pt is currently not having SI or HI and appears stable in NAD. Pt is cleared to be moved back to [facility].   Before TTS consult performed patient left without informing the staff, nurse or provider. Patient left before evaluation complete. Elopement. Patient does not appear to be a danger to himself or others and is without SI, HI or self-harm. No hallucinations. Patient had appointment with his PCP for discussing decreasing xanax and  follow up.  Patient left before any other outpatient resources could be provided or further discharge instructions.   Discussed return precautions with patient. Discussed all results and patient verbalizes understanding.         Louann Sjogren, PA-C 07/12/14 [phone]

## 2014-07-12 NOTE — ED Notes (Addendum)
Pt currently not in room. 

## 2014-07-17 NOTE — ED Provider Notes (Signed)
Medical screening examination/treatment/procedure(s) were performed by non-physician practitioner and as supervising physician I was immediately available for consultation/collaboration.   EKG Interpretation None       Kashlyn Salinas R. Cortana Vanderford, MD 07/17/14 0707 

## 2014-08-04 ENCOUNTER — Other Ambulatory Visit: Payer: Self-pay | Admitting: Physician Assistant

## 2014-08-05 ENCOUNTER — Other Ambulatory Visit: Payer: Self-pay | Admitting: Internal Medicine

## 2014-09-05 ENCOUNTER — Other Ambulatory Visit: Payer: Self-pay | Admitting: *Deleted

## 2014-09-05 MED ORDER — ALPRAZOLAM 1 MG PO TABS: ORAL_TABLET | ORAL | Status: DC

## 2014-10-20 ENCOUNTER — Ambulatory Visit: Payer: Self-pay | Admitting: Internal Medicine

## 2014-10-20 NOTE — Progress Notes (Signed)
Patient ID: Matthew Carroll, male   DOB: March 28, 1986, 29 y.o.   MRN: [id number]  R E S C [facility] E [facility] U L E

## 2014-11-03 ENCOUNTER — Other Ambulatory Visit: Payer: Self-pay | Admitting: Physician Assistant

## 2014-11-03 ENCOUNTER — Other Ambulatory Visit: Payer: Self-pay | Admitting: Internal Medicine

## 2014-11-03 MED ORDER — BUSPIRONE HCL 10 MG PO TABS
ORAL_TABLET | ORAL | Status: AC
Start: 2014-11-03 — End: 2014-11-03

## 2014-11-03 NOTE — Progress Notes (Unsigned)
-   Patient called for RF of Xanax and chart reviewed and noted Hx of oxy or hydro- codone abuse. Note recent ER visit for anxiety and alcohol level was 115 & patient left AMA before Psyche team for evaluation arrived.  - Long discussion on phone with patient regarding intended use of xanax is for short term mgmt of anxiety and will give enough Buspar til last until Feb 12 OV. Also advised that he may increase his Celexa 40 mg qd to 1&1/2 = 60 mg daily

## 2014-11-24 ENCOUNTER — Ambulatory Visit (INDEPENDENT_AMBULATORY_CARE_PROVIDER_SITE_OTHER): Payer: Self-pay | Admitting: Internal Medicine

## 2014-11-24 DIAGNOSIS — Z0000 Encounter for general adult medical examination without abnormal findings: Secondary | ICD-10-CM

## 2014-11-24 DIAGNOSIS — R69 Illness, unspecified: Secondary | ICD-10-CM

## 2014-11-26 NOTE — Progress Notes (Signed)
Patient ID: Matthew Carroll, male   DOB: October 16, 1985, 29 y.o.   MRN: [id number]  Matthew Carroll  O      S  H  O  W

## 2014-11-28 ENCOUNTER — Encounter: Payer: Self-pay | Admitting: Internal Medicine

## 2014-12-21 ENCOUNTER — Other Ambulatory Visit: Payer: Self-pay | Admitting: Physician Assistant

## 2014-12-27 ENCOUNTER — Ambulatory Visit (INDEPENDENT_AMBULATORY_CARE_PROVIDER_SITE_OTHER): Payer: Self-pay | Admitting: Internal Medicine

## 2014-12-27 ENCOUNTER — Encounter: Payer: Self-pay | Admitting: Internal Medicine

## 2014-12-27 VITALS — BP 136/84 | HR 98 | Temp 98.2°F | Resp 18 | Ht 69.0 in | Wt 181.0 lb

## 2014-12-27 DIAGNOSIS — G4700 Insomnia, unspecified: Secondary | ICD-10-CM

## 2014-12-27 DIAGNOSIS — F419 Anxiety disorder, unspecified: Secondary | ICD-10-CM

## 2014-12-27 DIAGNOSIS — E559 Vitamin D deficiency, unspecified: Secondary | ICD-10-CM

## 2014-12-27 DIAGNOSIS — E785 Hyperlipidemia, unspecified: Secondary | ICD-10-CM

## 2014-12-27 DIAGNOSIS — Z79899 Other long term (current) drug therapy: Secondary | ICD-10-CM

## 2014-12-27 LAB — CBC WITH DIFFERENTIAL/PLATELET
Basophils Absolute: 0 10*3/uL (ref 0.0–0.1)
Basophils Relative: 0 % (ref 0–1)
EOS PCT: 2 % (ref 0–5)
Eosinophils Absolute: 0.2 10*3/uL (ref 0.0–0.7)
HCT: 41.9 % (ref 39.0–52.0)
HEMOGLOBIN: 14.3 g/dL (ref 13.0–17.0)
LYMPHS ABS: 3.8 10*3/uL (ref 0.7–4.0)
LYMPHS PCT: 31 % (ref 12–46)
MCH: 30.3 pg (ref 26.0–34.0)
MCHC: 34.1 g/dL (ref 30.0–36.0)
MCV: 88.8 fL (ref 78.0–100.0)
MONO ABS: 0.7 10*3/uL (ref 0.1–1.0)
MPV: 9.2 fL (ref 8.6–12.4)
Monocytes Relative: 6 % (ref 3–12)
Neutro Abs: 7.5 10*3/uL (ref 1.7–7.7)
Neutrophils Relative %: 61 % (ref 43–77)
Platelets: 222 10*3/uL (ref 150–400)
RBC: 4.72 MIL/uL (ref 4.22–5.81)
RDW: 14.5 % (ref 11.5–15.5)
WBC: 12.3 10*3/uL — ABNORMAL HIGH (ref 4.0–10.5)

## 2014-12-27 MED ORDER — ALPRAZOLAM 1 MG PO TABS: 1.0000 mg | ORAL_TABLET | Freq: Three times a day (TID) | ORAL | Status: DC | PRN

## 2014-12-27 MED ORDER — ACYCLOVIR 800 MG PO TABS: 800.0000 mg | ORAL_TABLET | Freq: Every day | ORAL | Status: DC | PRN

## 2014-12-27 MED ORDER — CITALOPRAM HYDROBROMIDE 40 MG PO TABS: 40.0000 mg | ORAL_TABLET | Freq: Every day | ORAL | Status: DC

## 2014-12-27 NOTE — Progress Notes (Signed)
Patient ID: Reynaldo MiniumDavid W Birden, male   DOB: August 02, 1986, 29 y.o.   MRN: [id number]  Assessment and Plan:    1. Hyperlipidemia -encouraged diet and exercise - Lipid panel  2. Vitamin D deficiency -cont supplement - Vit D  25 hydroxy (rtn osteoporosis monitoring)  3. Anxiety -refill xanax given -therapy recommended.  Information for [facility] given   4. Medication management  - citalopram (CELEXA) 40 MG tablet; Take 1 tablet (40 mg total) by mouth daily.  Dispense: 90 tablet; Refill: 0 - acyclovir (ZOVIRAX) 800 MG tablet; Take 1 tablet (800 mg total) by mouth daily as needed (to keep in system). Patient states he just takes it PRN to keep it in his system  Dispense: 90 tablet; Refill: 0 - CBC with Differential/Platelet - BASIC METABOLIC PANEL WITH GFR - Hepatic function panel - Magnesium  5. Insomnia -patient already on large dose of trazedone and xanax -may need sleep study -discussed sleep hygeine -may be symptom secondary to anxiety, will try therapy  Continue diet and meds as discussed. Further disposition pending results of labs.  HPI 29 y.o. male  presents for 3 month follow up with hypertension, hyperlipidemia, prediabetes and vitamin D.   He is not on cholesterol medication and denies myalgias. His cholesterol is at goal. The cholesterol last visit was:  Total 184, LDL 114   Patient is on Vitamin D supplement.     He reports that sometimes he has difficulty sleeping.  He has difficulty staying asleep.  He has been doing better with falling asleep.   He reports that his anxiety has been doing better on the celexa.  He is doing deep breathing and stretching.  He is not currently seeing a therapist.  He is currently taking xanax 3 times daily.  He is taking 2 per day and then he takes his third pill to help him sleep. Wife reports no snoring.    Current Medications:  Current Outpatient Prescriptions on File Prior to Visit  Medication Sig Dispense Refill  . acyclovir  (ZOVIRAX) 800 MG tablet Take 800 mg by mouth daily as needed (to keep in system). Patient states he just takes it PRN to keep it in his system    . ALPRAZolam (XANAX) 1 MG tablet TAKE 1 TABLET BY MOUTH 3 TIMES DAILY AS NEEDED 21 tablet 0  . Aspirin-Acetaminophen-Caffeine (GOODYS EXTRA STRENGTH PO) Take 2 packets by mouth every 6 (six) hours as needed (pain).    . Cholecalciferol (VITAMIN D3) 2000 UNITS capsule Take 6,000 Units by mouth daily.    . citalopram (CELEXA) 40 MG tablet Take 40 mg by mouth daily.    . traZODone (DESYREL) 150 MG tablet Take 1/3 (50 mg)    to     1/2 (75 mg)      to      1 tablet (150 mg)  1 hour before sleep 90 tablet 1   No current facility-administered medications on file prior to visit.    Medical History:  Past Medical History  Diagnosis Date  . Hepatitis   . Anxiety disorder   . Colitis   . Herpes simplex   . ADD (attention deficit disorder)   . Hyperlipidemia   . Vitamin D deficiency     Allergies: No Known Allergies   Review of Systems:  Review of Systems  Constitutional: Negative for fever, chills, weight loss and diaphoresis.  HENT: Negative for congestion, nosebleeds, sore throat and tinnitus.   Eyes: Negative.   Respiratory: Negative for  cough, sputum production, shortness of breath and wheezing.   Cardiovascular: Negative for chest pain and leg swelling.  Gastrointestinal: Negative for heartburn, nausea, vomiting, diarrhea, constipation, blood in stool and melena.  Genitourinary: Negative.   Skin: Negative.   Neurological: Negative.  Negative for headaches.    Family history- Review and unchanged  Social history- Review and unchanged  Physical Exam: BP 136/84 mmHg  Pulse 98  Temp(Src) 98.2 F (36.8 C) (Temporal)  Resp 18  Ht  (1.753 m)  Wt 181 lb (82.101 kg)  BMI 26.72 kg/m2 Wt Readings from Last 3 Encounters:  12/27/14 181 lb (82.101 kg)  07/07/14 178 lb 6.4 oz (80.922 kg)  06/27/14 169 lb (76.658 kg)    General  Appearance: Well nourished well developed, in no apparent distress. Eyes: PERRLA, EOMs, conjunctiva no swelling or erythema ENT/Mouth: Ear canals normal without obstruction, swelling, erythma, discharge.  TMs normal bilaterally.  Oropharynx moist, clear, without exudate, or postoropharyngeal swelling. Neck: Supple, thyroid normal,no cervical adenopathy  Respiratory: Respiratory effort normal, Breath sounds clear A&P without rhonchi, wheeze, or rale.  No retractions, no accessory usage. Cardio: RRR with no MRGs. Brisk peripheral pulses without edema.  Abdomen: Soft, + BS,  Non tender, no guarding, rebound, hernias, masses. Musculoskeletal: Full ROM, 5/5 strength, Normal gait Skin: Warm, dry without rashes, lesions, ecchymosis.  Neuro: Awake and oriented X 3, Cranial nerves intact. Normal muscle tone, no cerebellar symptoms. Psych: Normal affect, Insight and Judgment appropriate.    FORCUCCI, Biviana Saddler, PA-C 3:44 PM [facility]

## 2014-12-27 NOTE — Patient Instructions (Signed)
Insomnia Insomnia is frequent trouble falling and/or staying asleep. Insomnia can be a long term problem or a short term problem. Both are common. Insomnia can be a short term problem when the wakefulness is related to a certain stress or worry. Long term insomnia is often related to ongoing stress during waking hours and/or poor sleeping habits. Overtime, sleep deprivation itself can make the problem worse. Every little thing feels more severe because you are overtired and your ability to cope is decreased. CAUSES   Stress, anxiety, and depression.  Poor sleeping habits.  Distractions such as TV in the bedroom.  Naps close to bedtime.  Engaging in emotionally charged conversations before bed.  Technical reading before sleep.  Alcohol and other sedatives. They may make the problem worse. They can hurt normal sleep patterns and normal dream activity.  Stimulants such as caffeine for several hours prior to bedtime.  Pain syndromes and shortness of breath can cause insomnia.  Exercise late at night.  Changing time zones may cause sleeping problems (jet lag). It is sometimes helpful to have someone observe your sleeping patterns. They should look for periods of not breathing during the night (sleep apnea). They should also look to see how long those periods last. If you live alone or observers are uncertain, you can also be observed at a sleep clinic where your sleep patterns will be professionally monitored. Sleep apnea requires a checkup and treatment. Give your caregivers your medical history. Give your caregivers observations your family has made about your sleep.  SYMPTOMS   Not feeling rested in the morning.  Anxiety and restlessness at bedtime.  Difficulty falling and staying asleep. TREATMENT   Your caregiver may prescribe treatment for an underlying medical disorders. Your caregiver can give advice or help if you are using alcohol or other drugs for self-medication. Treatment  of underlying problems will usually eliminate insomnia problems.  Medications can be prescribed for short time use. They are generally not recommended for lengthy use.  Over-the-counter sleep medicines are not recommended for lengthy use. They can be habit forming.  You can promote easier sleeping by making lifestyle changes such as:  Using relaxation techniques that help with breathing and reduce muscle tension.  Exercising earlier in the day.  Changing your diet and the time of your last meal. No night time snacks.  Establish a regular time to go to bed.  Counseling can help with stressful problems and worry.  Soothing music and white noise may be helpful if there are background noises you cannot remove.  Stop tedious detailed work at least one hour before bedtime. HOME CARE INSTRUCTIONS   Keep a diary. Inform your caregiver about your progress. This includes any medication side effects. See your caregiver regularly. Take note of:  Times when you are asleep.  Times when you are awake during the night.  The quality of your sleep.  How you feel the next day. This information will help your caregiver care for you.  Get out of bed if you are still awake after 15 minutes. Read or do some quiet activity. Keep the lights down. Wait until you feel sleepy and go back to bed.  Keep regular sleeping and waking hours. Avoid naps.  Exercise regularly.  Avoid distractions at bedtime. Distractions include watching television or engaging in any intense or detailed activity like attempting to balance the household checkbook.  Develop a bedtime ritual. Keep a familiar routine of bathing, brushing your teeth, climbing into bed at the same   time each night, listening to soothing music. Routines increase the success of falling to sleep faster.  Use relaxation techniques. This can be using breathing and muscle tension release routines. It can also include visualizing peaceful scenes. You can  also help control troubling or intruding thoughts by keeping your mind occupied with boring or repetitive thoughts like the old concept of counting sheep. You can make it more creative like imagining planting one beautiful flower after another in your backyard garden.  During your day, work to eliminate stress. When this is not possible use some of the previous suggestions to help reduce the anxiety that accompanies stressful situations. MAKE SURE YOU:   Understand these instructions.  Will watch your condition.  Will get help right away if you are not doing well or get worse. Document Released: 09/26/2000 Document Revised: 12/22/2011 Document Reviewed: 10/27/2007 Exit[employer] Patient Information 2015 [employer]. This information is not intended to replace advice given to you by your health care provider. Make sure you discuss any questions you have with your health care provider.  

## 2014-12-28 LAB — BASIC METABOLIC PANEL WITH GFR
BUN: 10 mg/dL (ref 6–23)
CALCIUM: 9.3 mg/dL (ref 8.4–10.5)
CO2: 25 mEq/L (ref 19–32)
Chloride: 103 mEq/L (ref 96–112)
Creat: 0.98 mg/dL (ref 0.50–1.35)
GFR, Est Non African American: >89 mL/min
Glucose, Bld: 109 mg/dL — ABNORMAL HIGH (ref 70–99)
Potassium: 3.5 mEq/L (ref 3.5–5.3)
Sodium: 139 mEq/L (ref 135–145)

## 2014-12-28 LAB — HEPATIC FUNCTION PANEL
ALT: 12 U/L (ref 0–53)
AST: 16 U/L (ref 0–37)
Albumin: 4.4 g/dL (ref 3.5–5.2)
Alkaline Phosphatase: 58 U/L (ref 39–117)
Bilirubin, Direct: 0.1 mg/dL (ref 0.0–0.3)
Indirect Bilirubin: 0.2 mg/dL (ref 0.2–1.2)
Total Bilirubin: 0.3 mg/dL (ref 0.2–1.2)
Total Protein: 7.7 g/dL (ref 6.0–8.3)

## 2014-12-28 LAB — LIPID PANEL
CHOL/HDL RATIO: 4.5 ratio
Cholesterol: 176 mg/dL (ref 0–200)
HDL: 39 mg/dL — AB (ref >=40)
LDL CALC: 98 mg/dL (ref 0–99)
TRIGLYCERIDES: 193 mg/dL — AB (ref <150)
VLDL: 39 mg/dL (ref 0–40)

## 2014-12-28 LAB — VITAMIN D 25 HYDROXY (VIT D DEFICIENCY, FRACTURES): Vit D, 25-Hydroxy: 18 ng/mL — ABNORMAL LOW (ref 30–100)

## 2014-12-28 LAB — MAGNESIUM: Magnesium: 2 mg/dL (ref 1.5–2.5)

## 2015-01-28 ENCOUNTER — Emergency Department (HOSPITAL_COMMUNITY)
Admission: EM | Admit: 2015-01-28 | Discharge: 2015-01-29 | Disposition: A | Discharge status: Home / Self Care | Payer: Self-pay | Attending: Emergency Medicine | Admitting: Emergency Medicine

## 2015-01-28 ENCOUNTER — Encounter (HOSPITAL_COMMUNITY): Payer: Self-pay | Admitting: Emergency Medicine

## 2015-01-28 DIAGNOSIS — Z8619 Personal history of other infectious and parasitic diseases: Secondary | ICD-10-CM | POA: Insufficient documentation

## 2015-01-28 DIAGNOSIS — F1010 Alcohol abuse, uncomplicated: Secondary | ICD-10-CM | POA: Insufficient documentation

## 2015-01-28 DIAGNOSIS — Z79899 Other long term (current) drug therapy: Secondary | ICD-10-CM | POA: Insufficient documentation

## 2015-01-28 DIAGNOSIS — F4325 Adjustment disorder with mixed disturbance of emotions and conduct: Secondary | ICD-10-CM | POA: Insufficient documentation

## 2015-01-28 DIAGNOSIS — Z720 Tobacco use: Secondary | ICD-10-CM | POA: Insufficient documentation

## 2015-01-28 DIAGNOSIS — F1110 Opioid abuse, uncomplicated: Secondary | ICD-10-CM | POA: Insufficient documentation

## 2015-01-28 DIAGNOSIS — F419 Anxiety disorder, unspecified: Secondary | ICD-10-CM | POA: Insufficient documentation

## 2015-01-28 DIAGNOSIS — E559 Vitamin D deficiency, unspecified: Secondary | ICD-10-CM | POA: Insufficient documentation

## 2015-01-28 DIAGNOSIS — Z8719 Personal history of other diseases of the digestive system: Secondary | ICD-10-CM | POA: Insufficient documentation

## 2015-01-28 LAB — CBC
HEMATOCRIT: 47.6 % (ref 39.0–52.0)
HEMOGLOBIN: 16.8 g/dL (ref 13.0–17.0)
MCH: 31.4 pg (ref 26.0–34.0)
MCHC: 35.3 g/dL (ref 30.0–36.0)
MCV: 89 fL (ref 78.0–100.0)
PLATELETS: 285 10*3/uL (ref 150–400)
RBC: 5.35 MIL/uL (ref 4.22–5.81)
RDW: 14 % (ref 11.5–15.5)
WBC: 11.9 10*3/uL — AB (ref 4.0–10.5)

## 2015-01-28 LAB — ETHANOL: Alcohol, Ethyl (B): 207 mg/dL — ABNORMAL HIGH (ref 0–9)

## 2015-01-28 LAB — COMPREHENSIVE METABOLIC PANEL
ALBUMIN: 5.1 g/dL (ref 3.5–5.2)
ALK PHOS: 69 U/L (ref 39–117)
ALT: 16 U/L (ref 0–53)
AST: 19 U/L (ref 0–37)
Anion gap: 5 (ref 5–15)
BUN: 5 mg/dL — ABNORMAL LOW (ref 6–23)
CHLORIDE: 109 mmol/L (ref 96–112)
CO2: 28 mmol/L (ref 19–32)
CREATININE: 0.9 mg/dL (ref 0.50–1.35)
Calcium: 8.9 mg/dL (ref 8.4–10.5)
Glucose, Bld: 90 mg/dL (ref 70–99)
Potassium: 3.2 mmol/L — ABNORMAL LOW (ref 3.5–5.1)
Sodium: 142 mmol/L (ref 135–145)
TOTAL PROTEIN: 8.8 g/dL — AB (ref 6.0–8.3)
Total Bilirubin: 0.4 mg/dL (ref 0.3–1.2)

## 2015-01-28 LAB — RAPID URINE DRUG SCREEN, HOSP PERFORMED
Amphetamines: NOT DETECTED
BENZODIAZEPINES: NOT DETECTED
Barbiturates: NOT DETECTED
Cocaine: NOT DETECTED
Opiates: POSITIVE — AB
TETRAHYDROCANNABINOL: NOT DETECTED

## 2015-01-28 LAB — SALICYLATE LEVEL: Salicylate Lvl: <4 mg/dL (ref 2.8–20.0)

## 2015-01-28 LAB — ACETAMINOPHEN LEVEL: Acetaminophen (Tylenol), Serum: <10 ug/mL — ABNORMAL LOW (ref 10–30)

## 2015-01-28 MED ORDER — TRAZODONE HCL 50 MG PO TABS
150.0000 mg | ORAL_TABLET | Freq: Every evening | ORAL | Status: DC | PRN
  Administered 2015-01-28: 150 mg via ORAL
  Filled 2015-01-28 (×2): qty 1

## 2015-01-28 MED ORDER — HYDROXYZINE HCL 25 MG PO TABS
50.0000 mg | ORAL_TABLET | Freq: Four times a day (QID) | ORAL | Status: DC | PRN
  Administered 2015-01-28 – 2015-01-29 (×3): 50 mg via ORAL
  Filled 2015-01-28 (×3): qty 2

## 2015-01-28 NOTE — BH Assessment (Signed)
Assessment completed. Consulted Birdena JubileeIjeoma Nwaze, NP who recommended that pt be re-evaluated by psychiatry in the am. Dr. Madilyn Hookees has been informed and is in agreement with recommendation.

## 2015-01-28 NOTE — ED Notes (Signed)
Pt. To [facility] from ED ambulatory without difficulty, to room 37 . [facility]. Pt. Is alert and oriented, warm and dry. Pt. Denies SI, HI, and AVH. Pt. Anxious and pacing, upset that he is under IVC. Pt threatening to "fuck you all up if I don't get something for anxiety" . Pt. Made aware of security cameras and Q15 minute rounds. Pt. Encouraged to let Nursing staff know of any concerns or needs.

## 2015-01-28 NOTE — ED Notes (Signed)
Police brought pt in under IVC for suicidal thoughts  Pt was going to go for help voluntarily and half way there he said he wanted to go home   Police officer taking out paperwork at this time  Pt states he has been drinking today but denies any other substance use  Pt states he is fine and does not know why he is here  Pt denies SI/HI at this time

## 2015-01-28 NOTE — ED Notes (Signed)
Sandwich and soft drink given.  

## 2015-01-28 NOTE — ED Provider Notes (Signed)
CSN: [id number]     Arrival date & time 01/28/15  2044 History   First MD Initiated Contact with Patient 01/28/15 2048     Chief Complaint  Patient presents with  . IVC   . Suicidal    The history is provided by the patient. No language interpreter was used.   Matthew Carroll has a hx/o Crohns and PTSD presents for IVC.  Patient denies any complaints but he states he's been under a lot of stress today. He said that he's been having some problems with his wife and children. He states he's been drinking he was drinking last night. He denies any SI or intent to harm himself but he thinks that the world would be better if he is not here. IVC papers were filled out prior to ED arrival. Symptoms are moderate, constant, worsening.  Past Medical History  Diagnosis Date  . Hepatitis   . Anxiety disorder   . Colitis   . Herpes simplex   . ADD (attention deficit disorder)   . Hyperlipidemia   . Vitamin D deficiency    Past Surgical History  Procedure Laterality Date  . Tendon repair      thumb   Family History  Problem Relation Age of Onset  . Diabetes Father   . Colon cancer Neg Hx    History  Substance Use Topics  . Smoking status: Current Every Day Smoker  . Smokeless tobacco: Not on file  . Alcohol Use: Yes    Review of Systems  All other systems reviewed and are negative.     Allergies  Review of patient's allergies indicates no known allergies.  Home Medications   Prior to Admission medications   Medication Sig Start Date End Date Taking? Authorizing Provider  acyclovir (ZOVIRAX) 800 MG tablet Take 1 tablet (800 mg total) by mouth daily as needed (to keep in system). Patient states he just takes it PRN to keep it in his system 12/27/14  Yes Courtney Forcucci, PA-C  ALPRAZolam (XANAX) 1 MG tablet Take 1 tablet (1 mg total) by mouth 3 (three) times daily as needed. 12/27/14  Yes Courtney Forcucci, PA-C  Aspirin-Acetaminophen-Caffeine (GOODYS EXTRA STRENGTH PO) Take 2 packets by  mouth every 6 (six) hours as needed (pain).   Yes Historical Provider, MD  Cholecalciferol (VITAMIN D3) 2000 UNITS capsule Take 6,000 Units by mouth daily.   Yes Historical Provider, MD  citalopram (CELEXA) 40 MG tablet Take 1 tablet (40 mg total) by mouth daily. 12/27/14  Yes Courtney Forcucci, PA-C  traZODone (DESYREL) 150 MG tablet Take 1/3 (50 mg)    to     1/2 (75 mg)      to      1 tablet (150 mg)  1 hour before sleep Patient taking differently: Take 50-150 mg by mouth at bedtime. Take 1/3 (50 mg)    to     1/2 (75 mg)      to      1 tablet (150 mg)  1 hour before sleep 07/07/14   Lucky Cowboy, MD   BP 97/76 mmHg  Pulse 106  Temp(Src) 98 F (36.7 C) (Oral)  Resp 18  SpO2 97% Physical Exam  Constitutional: He is oriented to person, place, and time. He appears well-developed and well-nourished.  HENT:  Head: Normocephalic and atraumatic.  Cardiovascular: Normal rate and regular rhythm.   No murmur heard. Pulmonary/Chest: Effort normal and breath sounds normal. No respiratory distress.  Abdominal: Soft. There is no tenderness.  There is no rebound and no guarding.  Musculoskeletal: He exhibits no edema or tenderness.  Neurological: He is alert and oriented to person, place, and time.  Skin: Skin is warm and dry.  Psychiatric:  Flat affect  Nursing note and vitals reviewed.   ED Course  Procedures (including critical care time) Labs Review Labs Reviewed  ACETAMINOPHEN LEVEL - Abnormal; Notable for the following:    Acetaminophen (Tylenol), Serum <10.0 (*)    All other components within normal limits  CBC - Abnormal; Notable for the following:    WBC 11.9 (*)    All other components within normal limits  COMPREHENSIVE METABOLIC PANEL - Abnormal; Notable for the following:    Potassium 3.2 (*)    BUN 5 (*)    Total Protein 8.8 (*)    All other components within normal limits  ETHANOL - Abnormal; Notable for the following:    Alcohol, Ethyl (B) 207 (*)    All other  components within normal limits  URINE RAPID DRUG SCREEN (HOSP PERFORMED) - Abnormal; Notable for the following:    Opiates POSITIVE (*)    All other components within normal limits  SALICYLATE LEVEL    Imaging Review No results found.   EKG Interpretation None      MDM   Final diagnoses:  Mood disorder    Patient here for evaluation after being IVC. Per IVC there is concern for suicidality. Patient does not endorse any suicidal thoughts in the emergency department but states that the will be better if he is not here. Patient has been medically cleared for psychiatric evaluation.    Tilden FossaElizabeth Jaidee Stipe, MD 01/28/15 [phone]

## 2015-01-28 NOTE — ED Notes (Signed)
Pt. Noted resting in room. No complaints or concerns voiced. No distress or abnormal behavior noted. Will continue to monitor with security cameras. Q 15 minute rounds continue. 

## 2015-01-28 NOTE — BH Assessment (Signed)
Tele Assessment Note   Matthew Carroll is an 29 y.[facility]. [facility] [employer]. Pt stated "I was talking to my mother on the phone and the police were at my house.  The petition stated that pt is diagnosed with anxiety and it is unknown if he is prescribed medication. [employer] reported that when they arrived at pt's home they notice that the inside and outside of his residence were trashed and pt had not been tending to his hygiene. He admitted that he was intending to harm himself to his mother.  Pt denies SI, HI and AVH at this time. PT did not report any previous suicide attempts or psychiatric hospitalizations. PT reported that he has anxiety and is prescribed Xanax by his PCP. PT is endorsing multiple depressive symptoms and shared that his sleep is poor. Pt reported that he gets approximately 2 hours of sleep at night. Pt did not report any illicit substance abuse and shared that he socially drinks alcohol. Pt reported that he is currently on house arrest and would not share any additional information regarding his house arrest only stating "pending matters". Pt also reported that he has an upcoming court date on May 2nd.  Pt did not report any physical, sexual or emotional abuse at this time.  Axis I: Depressive Disorder NOS and Generalized Anxiety Disorder  Past Medical History:  Past Medical History  Diagnosis Date  . Hepatitis   . Anxiety disorder   . Colitis   . Herpes simplex   . ADD (attention deficit disorder)   . Hyperlipidemia   . Vitamin D deficiency     Past Surgical History  Procedure Laterality Date  . Tendon repair      thumb    Family History:  Family History  Problem Relation Age of Onset  . Diabetes Father   . Colon cancer Neg Hx     Social History:  reports that he has been smoking.  He does not have any smokeless tobacco history on file. He reports that he drinks alcohol. He reports that he does not use illicit drugs.  Additional Social  History:  Alcohol / Drug Use History of alcohol / drug use?: Yes Substance #1 Name of Substance 1: Alcohol  1 - Age of First Use: unknown  1 - Amount (size/oz): unknown 1 - Frequency: unknown 1 - Duration: unknown  1 - Last Use / Amount: 4-17 BAL-207  CIWA: CIWA-Ar BP: 97/76 mmHg Pulse Rate: 106 COWS:    PATIENT STRENGTHS: (choose at least two) Average or above average intelligence Capable of independent living  Allergies: No Known Allergies  Home Medications:  (Not in a hospital admission)  OB/GYN Status:  No LMP for male patient.  General Assessment Data Location of Assessment: [facility] ED Is this a Tele or Face-to-Face Assessment?: Face-to-Face Is this an Initial Assessment or a Re-assessment for this encounter?: Initial Assessment Living Arrangements: Spouse/significant other Can pt return to current living arrangement?: Yes Admission Status: Involuntary Is patient capable of signing voluntary admission?: Yes Transfer from: Home Referral Source: Self/Family/Friend     [facility] Living Arrangements: Spouse/significant other Name of Psychiatrist: No provider reported.  Name of Therapist: No provider reported.   Education Status Is patient currently in school?: No  Risk to self with the past 6 months Suicidal Ideation: No-Not Currently/Within Last 6 Months (Pt denies at this time; however IVC papers stated SI ) Suicidal Intent: No Is patient at risk for  suicide?: Yes Suicidal Plan?: No Access to Means: No What has been your use of drugs/alcohol within the last 12 months?: Pt reported social alchol use. BAL 207 Previous Attempts/Gestures: No How many times?: 0 Other Self Harm Risks: No other self harm risk identified at this time.  Triggers for Past Attempts: None known Intentional Self Injurious Behavior: None Family Suicide History: No Recent stressful life event(s):  (Pt did not report any stressors at this time) Persecutory voices/beliefs?:  No Depression: Yes Depression Symptoms: Insomnia, Despondent, Fatigue, Feeling worthless/self pity, Feeling angry/irritable Substance abuse history and/or treatment for substance abuse?: Yes Suicide prevention information given to non-admitted patients: Not applicable  Risk to Others within the past 6 months Homicidal Ideation: No Thoughts of Harm to Others: No Current Homicidal Intent: No Current Homicidal Plan: No Access to Homicidal Means: No Identified Victim: NA History of harm to others?: No Assessment of Violence: None Noted Violent Behavior Description: No violent behaviors observed.  Does patient have access to weapons?: No Criminal Charges Pending?: Yes Describe Pending Criminal Charges: "I don't know".  Does patient have a court date: Yes Court Date: 02/12/15  Psychosis Hallucinations: None noted Delusions: None noted  Mental Status Report Appearance/Hygiene: In scrubs Eye Contact: Fair Motor Activity: Restlessness Speech: Logical/coherent Level of Consciousness: Quiet/awake Mood: Irritable Affect: Blunted Anxiety Level: Panic Attacks Panic attack frequency: Pt reported daily panic attacks Thought Processes: Coherent, Relevant Judgement: Partial Orientation: Time, Place, Person Obsessive Compulsive Thoughts/Behaviors: None  Cognitive Functioning Concentration: Normal Memory: Recent Intact IQ: Average Insight: Fair Impulse Control: Fair Appetite: Good Weight Loss: 0 Weight Gain: 0 Sleep: Decreased Total Hours of Sleep: 2 Vegetative Symptoms: Staying in bed, Decreased grooming (IVC states pt has not tended to personal hygiene.)  ADLScreening [facility] Assessment Services) Patient's cognitive ability adequate to safely complete daily activities?: Yes Patient able to express need for assistance with ADLs?: Yes Independently performs ADLs?: Yes (appropriate for developmental age)  Prior Inpatient Therapy Prior Inpatient Therapy: No  Prior Outpatient  Therapy Prior Outpatient Therapy: No  ADL Screening (condition at time of admission) Patient's cognitive ability adequate to safely complete daily activities?: Yes Is the patient deaf or have difficulty hearing?: No Does the patient have difficulty seeing, even when wearing glasses/contacts?: No Does the patient have difficulty concentrating, remembering, or making decisions?: No Patient able to express need for assistance with ADLs?: Yes Does the patient have difficulty dressing or bathing?: No Independently performs ADLs?: Yes (appropriate for developmental age)       Abuse/Neglect Assessment (Assessment to be complete while patient is alone) Physical Abuse: Denies Verbal Abuse: Denies Sexual Abuse: Denies Exploitation of patient/patient's resources: Denies Self-Neglect: Denies     Merchant navy officer (For Healthcare) Does patient have an advance directive?: No Would patient like information on creating an advanced directive?: No - patient declined information    Additional Information 1:1 In Past 12 Months?: No CIRT Risk: Yes Elopement Risk: No Does patient have medical clearance?: Yes     Disposition: Psychiatric evaluation in the am.  Disposition Initial Assessment Completed for this Encounter: Yes  Orland Visconti S 01/28/2015 10:58 PM

## 2015-01-29 DIAGNOSIS — F909 Attention-deficit hyperactivity disorder, unspecified type: Secondary | ICD-10-CM

## 2015-01-29 DIAGNOSIS — F4325 Adjustment disorder with mixed disturbance of emotions and conduct: Secondary | ICD-10-CM | POA: Diagnosis present

## 2015-01-29 DIAGNOSIS — F1010 Alcohol abuse, uncomplicated: Secondary | ICD-10-CM | POA: Diagnosis present

## 2015-01-29 NOTE — ED Notes (Signed)
Ice water given

## 2015-01-29 NOTE — ED Notes (Signed)
Patient left the unit ambulatory to go wait in the lobby for his transportation.

## 2015-01-29 NOTE — Consult Note (Signed)
[facility] Consult   Reason for Consult:  Alcohol abuse, suicidal ideations Referring Physician:  EDP Patient Identification: Matthew Carroll MRN:  [id number] Principal Diagnosis: Adjustment disorder with mixed disturbance of emotions and conduct Diagnosis:   Patient Active Problem List   Diagnosis Date Noted  . Adjustment disorder with mixed disturbance of emotions and conduct [F43.25] 01/29/2015    Priority: High  . Alcohol abuse [F10.10] 01/29/2015    Priority: High  . ADD (attention deficit disorder) [F90.9]   . Hyperlipidemia [E78.5]   . Vitamin D deficiency [E55.9]   . Ingrown toenail [L60.0] 05/13/2011  . Anxiety [F41.9] 05/06/2011  . Diarrhea [R19.7] 08/01/2010  . Chronic abdominal pain [R10.9, G89.29] 08/01/2010    Total Time spent with patient: 45 minutes  Subjective:   Matthew Carroll is a 29 y.o. male patient stabilized.  HPI:  The patient was drinking yesterday and after talking to his mother (who lives in [place]), she called the police on him stating he was suicidal.  The patient is calm and cooperative today.  Matthew Carroll reports he does not remember what he said but that he is not suicidal or homicidal, denies hallucinations and drug abuse.  He also does not feel his alcohol is an issue.  Matthew Carroll lives with his wife and two kids, 17 and 32 yo.  He is having financial issues but nothing he feels he can't handle.  Denies suicide attempts in the past or psychiatric issues.   HPI Elements:   Location:  generalized. Quality:  acute. Severity:  mild. Timing:  intermittent. Duration:  brief. Context:  alcohol abuse.  Past Medical History:  Past Medical History  Diagnosis Date  . Hepatitis   . Anxiety disorder   . Colitis   . Herpes simplex   . ADD (attention deficit disorder)   . Hyperlipidemia   . Vitamin D deficiency     Past Surgical History  Procedure Laterality Date  . Tendon repair      thumb   Family History:  Family History  Problem Relation Age  of Onset  . Diabetes Father   . Colon cancer Neg Hx    Social History:  History  Alcohol Use  . Yes     History  Drug Use No    History   Social History  . Marital Status: Married    Spouse Name: N/A  . Number of Children: 1  . Years of Education: N/A   Occupational History  . Set Up     Social History Main Topics  . Smoking status: Current Every Day Smoker  . Smokeless tobacco: Not on file  . Alcohol Use: Yes  . Drug Use: No  . Sexual Activity: Not on file   Other Topics Concern  . None   Social History Narrative   Daily caffeine use 4/day.   Additional Social History:    History of alcohol / drug use?: Yes Name of Substance 1: Alcohol  1 - Age of First Use: unknown  1 - Amount (size/oz): unknown 1 - Frequency: unknown 1 - Duration: unknown  1 - Last Use / Amount: 4-17 BAL-207                   Allergies:  No Known Allergies  Labs:  Results for orders placed or performed during the hospital encounter of 01/28/15 (from the past 48 hour(s))  Acetaminophen level     Status: Abnormal   Collection Time: 01/28/15  9:21 PM  Result Value Ref Range   Acetaminophen (Tylenol), Serum <10.0 (L) 10 - 30 ug/mL    Comment:        THERAPEUTIC CONCENTRATIONS VARY SIGNIFICANTLY. A RANGE OF 10-30 ug/mL MAY BE AN EFFECTIVE CONCENTRATION FOR MANY PATIENTS. HOWEVER, SOME ARE BEST TREATED AT CONCENTRATIONS OUTSIDE THIS RANGE. ACETAMINOPHEN CONCENTRATIONS >150 ug/mL AT 4 HOURS AFTER INGESTION AND >50 ug/mL AT 12 HOURS AFTER INGESTION ARE OFTEN ASSOCIATED WITH TOXIC REACTIONS.   CBC     Status: Abnormal   Collection Time: 01/28/15  9:21 PM  Result Value Ref Range   WBC 11.9 (H) 4.0 - 10.5 K/uL   RBC 5.35 4.22 - 5.81 MIL/uL   Hemoglobin 16.8 13.0 - 17.0 g/dL   HCT 47.6 39.0 - 52.0 %   MCV 89.0 78.0 - 100.0 fL   MCH 31.4 26.0 - 34.0 pg   MCHC 35.3 30.0 - 36.0 g/dL   RDW 14.0 11.5 - 15.5 %   Platelets 285 150 - 400 K/uL  Comprehensive metabolic panel      Status: Abnormal   Collection Time: 01/28/15  9:21 PM  Result Value Ref Range   Sodium 142 135 - 145 mmol/L   Potassium 3.2 (L) 3.5 - 5.1 mmol/L   Chloride 109 96 - 112 mmol/L   CO2 28 19 - 32 mmol/L   Glucose, Bld 90 70 - 99 mg/dL   BUN 5 (L) 6 - 23 mg/dL   Creatinine, Ser 0.90 0.50 - 1.35 mg/dL   Calcium 8.9 8.4 - 10.5 mg/dL   Total Protein 8.8 (H) 6.0 - 8.3 g/dL   Albumin 5.1 3.5 - 5.2 g/dL   AST 19 0 - 37 U/L   ALT 16 0 - 53 U/L   Alkaline Phosphatase 69 39 - 117 U/L   Total Bilirubin 0.4 0.3 - 1.2 mg/dL   GFR calc non Af Amer >90 >90 mL/min   GFR calc Af Amer >90 >90 mL/min    Comment: (NOTE) The eGFR has been calculated using the CKD EPI equation. This calculation has not been validated in all clinical situations. eGFR's persistently <90 mL/min signify possible Chronic Kidney Disease.    Anion gap 5 5 - 15  Ethanol (ETOH)     Status: Abnormal   Collection Time: 01/28/15  9:21 PM  Result Value Ref Range   Alcohol, Ethyl (B) 207 (H) 0 - 9 mg/dL    Comment:        LOWEST DETECTABLE LIMIT FOR SERUM ALCOHOL IS 11 mg/dL FOR MEDICAL PURPOSES ONLY   Salicylate level     Status: None   Collection Time: 01/28/15  9:21 PM  Result Value Ref Range   Salicylate Lvl <2.5 2.8 - 20.0 mg/dL  Urine Drug Screen     Status: Abnormal   Collection Time: 01/28/15  9:33 PM  Result Value Ref Range   Opiates POSITIVE (A) NONE DETECTED   Cocaine NONE DETECTED NONE DETECTED   Benzodiazepines NONE DETECTED NONE DETECTED   Amphetamines NONE DETECTED NONE DETECTED   Tetrahydrocannabinol NONE DETECTED NONE DETECTED   Barbiturates NONE DETECTED NONE DETECTED    Comment:        DRUG SCREEN FOR MEDICAL PURPOSES ONLY.  IF CONFIRMATION IS NEEDED FOR ANY PURPOSE, NOTIFY LAB WITHIN 5 DAYS.        LOWEST DETECTABLE LIMITS FOR URINE DRUG SCREEN Drug Class       Cutoff (ng/mL) Amphetamine      1000 Barbiturate      200 Benzodiazepine  893 Tricyclics       734 Opiates          300 Cocaine           300 THC              50     Vitals: Blood pressure 141/72, pulse 78, temperature 98.4 F (36.9 C), temperature source Oral, resp. rate 18, SpO2 99 %.  Risk to Self: Suicidal Ideation: No-Not Currently/Within Last 6 Months (Pt denies at this time; however IVC papers stated SI ) Suicidal Intent: No Is patient at risk for suicide?: Yes Suicidal Plan?: No Access to Means: No What has been your use of drugs/alcohol within the last 12 months?: Pt reported social alchol use. BAL 207 How many times?: 0 Other Self Harm Risks: No other self harm risk identified at this time.  Triggers for Past Attempts: None known Intentional Self Injurious Behavior: None Risk to Others: Homicidal Ideation: No Thoughts of Harm to Others: No Current Homicidal Intent: No Current Homicidal Plan: No Access to Homicidal Means: No Identified Victim: NA History of harm to others?: No Assessment of Violence: None Noted Violent Behavior Description: No violent behaviors observed.  Does patient have access to weapons?: No Criminal Charges Pending?: Yes Describe Pending Criminal Charges: "I don't know".  Does patient have a court date: Yes Court Date: 02/12/15 Prior Inpatient Therapy: Prior Inpatient Therapy: No Prior Outpatient Therapy: Prior Outpatient Therapy: No  Current Facility-Administered Medications  Medication Dose Route Frequency Provider Last Rate Last Dose  . hydrOXYzine (ATARAX/VISTARIL) tablet 50 mg  50 mg Oral Q6H PRN Harriet Butte, NP   50 mg at 01/29/15 1032  . traZODone (DESYREL) tablet 150 mg  150 mg Oral QHS PRN Harriet Butte, NP   150 mg at 01/28/15 2211   Current Outpatient Prescriptions  Medication Sig Dispense Refill  . acyclovir (ZOVIRAX) 800 MG tablet Take 1 tablet (800 mg total) by mouth daily as needed (to keep in system). Patient states he just takes it PRN to keep it in his system 90 tablet 0  . ALPRAZolam (XANAX) 1 MG tablet Take 1 tablet (1 mg total) by mouth 3 (three)  times daily as needed. 90 tablet 0  . Aspirin-Acetaminophen-Caffeine (GOODYS EXTRA STRENGTH PO) Take 2 packets by mouth every 6 (six) hours as needed (pain).    . Cholecalciferol (VITAMIN D3) 2000 UNITS capsule Take 6,000 Units by mouth daily.    . citalopram (CELEXA) 40 MG tablet Take 1 tablet (40 mg total) by mouth daily. 90 tablet 0  . traZODone (DESYREL) 150 MG tablet Take 1/3 (50 mg)    to     1/2 (75 mg)      to      1 tablet (150 mg)  1 hour before sleep (Patient taking differently: Take 50-150 mg by mouth at bedtime. Take 1/3 (50 mg)    to     1/2 (75 mg)      to      1 tablet (150 mg)  1 hour before sleep) 90 tablet 1    Musculoskeletal: Strength & Muscle Tone: within normal limits Gait & Station: normal Patient leans: N/A  Psychiatric Specialty Exam:     Blood pressure 141/72, pulse 78, temperature 98.4 F (36.9 C), temperature source Oral, resp. rate 18, SpO2 99 %.There is no weight on file to calculate BMI.  General Appearance: Casual  Eye Contact::  Good  Speech:  Normal Rate  Volume:  Normal  Mood:  Euthymic  Affect:  Congruent  Thought Process:  Coherent  Orientation:  Full (Time, Place, and Person)  Thought Content:  WDL  Suicidal Thoughts:  No  Homicidal Thoughts:  No  Memory:  Immediate;   Good Recent;   Good Remote;   Good  Judgement:  Fair  Insight:  Fair  Psychomotor Activity:  Normal  Concentration:  Good  Recall:  Good  Fund of Knowledge:Good  Language: Good  Akathisia:  No  Handed:  Right  AIMS (if indicated):     Assets:  Housing Intimacy Leisure Time Physical Health Resilience Social Support  ADL's:  Intact  Cognition: WNL  Sleep:      Medical Decision Making: Review of Psycho-Social Stressors (1), Review or order clinical lab tests (1) and Review of Medication Regimen & Side Effects (2)  Treatment Plan Summary: Daily contact with patient to assess and evaluate symptoms and progress in treatment, Medication management and Plan discharge  home with follow-up resources  Plan:  No evidence of imminent risk to self or others at present.   Disposition: Discharge home with follow-up resources  Waylan Boga, [place] 01/29/2015 2:30 PM Patient seen face-to-face for psychiatric evaluation, chart reviewed and case discussed with the physician extender and developed treatment plan. Reviewed the information documented and agree with the treatment plan. Corena Pilgrim, MD

## 2015-01-29 NOTE — ED Notes (Signed)
Pt. Noted sleeping in room. No complaints or concerns voiced. No distress or abnormal behavior noted. Will continue to monitor with security cameras. Q 15 minute rounds continue. 

## 2015-01-29 NOTE — ED Notes (Signed)
Pt. Noted in room. Pt. C/o increased anxiety. No acute distress or abnormal behavior noted. Will continue to monitor with security cameras. Q 15 minute rounds continue.

## 2015-01-29 NOTE — ED Notes (Signed)
Pt. C/o anxiety. 

## 2015-01-29 NOTE — Progress Notes (Signed)
Pt provided pt mother, Okey RegalCarol [phone] to obtain collateral. CSW attempted to call mother to obtain collateral and unable to leave a message. CSW also tried pt wife to obtain collateral however no working cell phone at this time and not at home.   Olga CoasterKristen Keyosha Tiedt, LCSW  Clinical Social Work  [facility] [phone]

## 2015-01-29 NOTE — ED Notes (Signed)
[facility] states they [facility] him here and told him he could get a ride back if he wanted.  [employer] service called and they are sending officers to transport patient.  He is pacing at this time.  Declines all offers for referrals to therapists or psychiatric care.

## 2015-01-29 NOTE — Progress Notes (Signed)
MEDICATION RELATED CONSULT NOTE - INITIAL   Pharmacy Consult for Dr. Madilyn Hookees Indication: Medrec--chronic steroids  No Known Allergies  Patient Measurements:   Adjusted Body Weight:   Vital Signs: Temp: 98 F (36.7 C) (04/17 2053) Temp Source: Oral (04/17 2053) BP: 97/76 mmHg (04/17 2053) Pulse Rate: 106 (04/17 2053) Intake/Output from previous day:   Intake/Output from this shift:    Labs:  Recent Labs  01/28/15 2121  WBC 11.9*  HGB 16.8  HCT 47.6  PLT 285  CREATININE 0.90  ALBUMIN 5.1  PROT 8.8*  AST 19  ALT 16  ALKPHOS 69  BILITOT 0.4   CrCl cannot be calculated (Unknown ideal weight.).   Microbiology: No results found for this or any previous visit (from the past 720 hour(s)).  Medical History: Past Medical History  Diagnosis Date  . Hepatitis   . Anxiety disorder   . Colitis   . Herpes simplex   . ADD (attention deficit disorder)   . Hyperlipidemia   . Vitamin D deficiency     Medications:   (Not in a hospital admission)  Assessment: MD request for pharmacy to "evaluate if patient is truly on chronic steroids"  Goal of Therapy:  Provide Medication information for MD  Plan:  1. Searched prior med rec performed, earliest noted steroid on 04/2014 2. Called [facility] and requested information about current or recent steroid Rx, per [facility], no current or recent (>6 months) Rx for steroids.   Matthew Carroll, Matthew Carroll 01/29/2015,12:28 AM

## 2015-01-29 NOTE — BH Assessment (Signed)
[facility] Assessment Progress Note  Per Thedore MinsMojeed Akintayo, MD this pt does not meet criteria for psychiatric hospitalization at this time.  He presents under petition initiated by police.  IVC is to be rescinded and he is to be offered outpatient referral information.  IVC has been rescinded.  I spoke to the pt to ask if he is interested in referral information, and he has declined.  Pt's nurse, Dawnaly, has been notified.  Matthew Canninghomas Amea Mcphail, MA Triage Specialist 01/29/2015 @ 11:54

## 2015-01-29 NOTE — [facility] Suicide Risk Assessment (Signed)
Suicide Risk Assessment  Discharge Assessment   [facility]   Demographic Factors:  Male, Adolescent or young adult and Caucasian  Total Time spent with patient: 45 minutes  Musculoskeletal: Strength & Muscle Tone: within normal limits Gait & Station: normal Patient leans: N/A  Psychiatric Specialty Exam:     Blood pressure 141/72, pulse 78, temperature 98.4 F (36.9 C), temperature source Oral, resp. rate 18, SpO2 99 %.There is no weight on file to calculate BMI.  General Appearance: Casual  Eye Contact::  Good  Speech:  Normal Rate  Volume:  Normal  Mood:  Euthymic  Affect:  Congruent  Thought Process:  Coherent  Orientation:  Full (Time, Place, and Person)  Thought Content:  WDL  Suicidal Thoughts:  No  Homicidal Thoughts:  No  Memory:  Immediate;   Good Recent;   Good Remote;   Good  Judgement:  Fair  Insight:  Fair  Psychomotor Activity:  Normal  Concentration:  Good  Recall:  Good  Fund of Knowledge:Good  Language: Good  Akathisia:  No  Handed:  Right  AIMS (if indicated):     Assets:  Housing Intimacy Leisure Time Physical Health Resilience Social Support  ADL's:  Intact  Cognition: WNL  Sleep:          Has this patient used any form of tobacco in the last 30 days? (Cigarettes, Smokeless Tobacco, Cigars, and/or Pipes) Yes, A prescription for an FDA-approved tobacco cessation medication was offered at discharge and the patient refused  Mental Status Per Nursing Assessment::   On Admission:   Alcohol intoxication  Current Mental Status by Physician: NA  Loss Factors: NA  Historical Factors: NA  Risk Reduction Factors:   Sense of responsibility to family, Living with another person, especially a relative and Positive social support  Continued Clinical Symptoms:  None   Cognitive Features That Contribute To Risk:  None    Suicide Risk:  Minimal: No identifiable suicidal ideation.  Patients presenting with no  risk factors but with morbid ruminations; may be classified as minimal risk based on the severity of the depressive symptoms  Principal Problem: Adjustment disorder with mixed disturbance of emotions and conduct Discharge Diagnoses:  Patient Active Problem List   Diagnosis Date Noted  . Adjustment disorder with mixed disturbance of emotions and conduct [F43.25] 01/29/2015    Priority: High  . Alcohol abuse [F10.10] 01/29/2015    Priority: High  . ADD (attention deficit disorder) [F90.9]   . Hyperlipidemia [E78.5]   . Vitamin D deficiency [E55.9]   . Ingrown toenail [L60.0] 05/13/2011  . Anxiety [F41.9] 05/06/2011  . Diarrhea [R19.7] 08/01/2010  . Chronic abdominal pain [R10.9, G89.29] 08/01/2010      Plan Of Care/Follow-up recommendations:  Activity:  as tolerated Diet:  heart healthy diet  Is patient on multiple antipsychotic therapies at discharge:  No   Has Patient had three or more failed trials of antipsychotic monotherapy by history:  No  Recommended Plan for Multiple Antipsychotic Therapies: NA    LORD, JAMISON, PMH-NP 01/29/2015, 2:40 PM

## 2015-01-31 ENCOUNTER — Other Ambulatory Visit: Payer: Self-pay | Admitting: Internal Medicine

## 2015-02-07 ENCOUNTER — Telehealth: Payer: Self-pay | Admitting: *Deleted

## 2015-02-07 NOTE — Telephone Encounter (Signed)
Patient's phone not able to take calls, so his mother called and requested a refill on his Xanax, at the patient's request.  Per Dr Oneta RackMcKeown, [facility]  The phone # and address were given to his mother.

## 2015-04-04 ENCOUNTER — Ambulatory Visit: Payer: Self-pay | Admitting: Internal Medicine

## 2015-04-20 ENCOUNTER — Other Ambulatory Visit: Payer: Self-pay | Admitting: Internal Medicine

## 2015-04-29 ENCOUNTER — Other Ambulatory Visit: Payer: Self-pay | Admitting: Internal Medicine

## 2016-06-01 ENCOUNTER — Encounter (HOSPITAL_COMMUNITY): Payer: Self-pay | Admitting: Emergency Medicine

## 2016-06-01 ENCOUNTER — Emergency Department (HOSPITAL_COMMUNITY)
Admission: EM | Admit: 2016-06-01 | Discharge: 2016-06-01 | Disposition: A | Discharge status: Home / Self Care | Payer: Self-pay | Attending: Emergency Medicine | Admitting: Emergency Medicine

## 2016-06-01 DIAGNOSIS — F4323 Adjustment disorder with mixed anxiety and depressed mood: Secondary | ICD-10-CM | POA: Insufficient documentation

## 2016-06-01 DIAGNOSIS — F4321 Adjustment disorder with depressed mood: Secondary | ICD-10-CM

## 2016-06-01 DIAGNOSIS — Z7982 Long term (current) use of aspirin: Secondary | ICD-10-CM | POA: Insufficient documentation

## 2016-06-01 DIAGNOSIS — F17200 Nicotine dependence, unspecified, uncomplicated: Secondary | ICD-10-CM | POA: Insufficient documentation

## 2016-06-01 NOTE — ED Provider Notes (Signed)
[facility]-EMERGENCY DEPT Provider Note   CSN: [id number] Arrival date & time: 06/01/16  1613     History   Chief Complaint Chief Complaint  Patient presents with  . Depression  . Anxiety    HPI Matthew Carroll is a 30 y.o. male.  HPI   This is a 30 year old male who comes in today stating that his 366-month-old child died yesterday. He states he feels very sad and is tearful. He states he was told by friends to come to the hospital for help. He denies any suicidal ideation or homicidal ideation. He states his wife is also here and has checked in. He denies any substance abuse or alcohol abuse. He has 2 other children and states they are with his sister-in-law.  Past Medical History:  Diagnosis Date  . ADD (attention deficit disorder)   . Anxiety disorder   . Colitis   . Hepatitis   . Herpes simplex   . Hyperlipidemia   . Vitamin D deficiency     Patient Active Problem List   Diagnosis Date Noted  . 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 Alcohol abuse 01/29/2015  . ADD (attention deficit disorder)   . Hyperlipidemia   . Vitamin D deficiency   . Ingrown toenail 05/13/2011  . Anxiety 05/06/2011  . Diarrhea 08/01/2010  . Chronic abdominal pain 08/01/2010    Past Surgical History:  Procedure Laterality Date  . TENDON REPAIR     thumb       Home Medications    Prior to Admission medications   Medication Sig Start Date End Date Taking? Authorizing Provider  acyclovir (ZOVIRAX) 800 MG tablet Take 1 tablet (800 mg total) by mouth daily as needed (to keep in system). Patient states he just takes it PRN to keep it in his system 12/27/14   Terri Piedraourtney Forcucci, PA-C  ALPRAZolam Prudy Feeler(XANAX) 1 MG tablet Take 1 tablet (1 mg total) by mouth 3 (three) times daily as needed. 12/27/14   Courtney Forcucci, PA-C  Aspirin-Acetaminophen-Caffeine (GOODYS EXTRA STRENGTH PO) Take 2 packets by mouth every 6 (six) hours as needed (pain).    Historical  Provider, MD  Cholecalciferol (VITAMIN D3) 2000 UNITS capsule Take 6,000 Units by mouth daily.    Historical Provider, MD  citalopram (CELEXA) 40 MG tablet Take 1 tablet (40 mg total) by mouth daily. 12/27/14   Courtney Forcucci, PA-C  traZODone (DESYREL) 150 MG tablet Take 1/3 (50 mg)    to     1/2 (75 mg)      to      1 tablet (150 mg)  1 hour before sleep Patient taking differently: Take 50-150 mg by mouth at bedtime. Take 1/3 (50 mg)    to     1/2 (75 mg)      to      1 tablet (150 mg)  1 hour before sleep 07/07/14   Lucky CowboyWilliam McKeown, MD    Family History Family History  Problem Relation Age of Onset  . Diabetes Father   . Colon cancer Neg Hx     Social History Social History  Substance Use Topics  . Smoking status: Current Every Day Smoker  . Smokeless tobacco: Never Used  . Alcohol use Yes     Allergies   Review of patient's allergies indicates no known allergies.   Review of Systems Review of Systems  All other systems reviewed and are negative.    Physical Exam Updated Vital Signs BP  112/80 (BP Location: Left Arm)   Pulse 104   Temp 98.1 F (36.7 C) (Oral)   Resp 14   SpO2 98%   Physical Exam  Constitutional: He is oriented to person, place, and time. He appears well-developed and well-nourished. He appears distressed.  HENT:  Head: Normocephalic.  Eyes: Pupils are equal, round, and reactive to light.  Neck: Normal range of motion. Neck supple.  Musculoskeletal: Normal range of motion.  Neurological: He is alert and oriented to person, place, and time.  Skin: Skin is warm.  Psychiatric:  Patient is tearful. He is not homicidal or suicidal.  Nursing note and vitals reviewed.    ED Treatments / Results  Labs (all labs ordered are listed, but only abnormal results are displayed) Labs Reviewed - No data to display  EKG  EKG Interpretation None       Radiology No results found.  Procedures Procedures (including critical care  time)  Medications Ordered in ED Medications - No data to display   Initial Impression / Assessment and Plan / ED Course  I have reviewed the triage vital signs and the nursing notes.  Pertinent labs & imaging results that were available during my care of the patient were reviewed by me and considered in my medical decision making (see chart for details).  Clinical Course    30 year old male whose 1746-month-old daughter died yesterday. He is here with her grief reaction. He appears appropriately bereaved and grieving. Information regarding resources in the community for assistance. I do not think he has any indication for acute mental health intervention.  Final Clinical Impressions(s) / ED Diagnoses   Final diagnoses:  Grief reaction    New Prescriptions New Prescriptions   No medications on file     Margarita Grizzleanielle Stephone Gum, MD 06/01/16 1751

## 2016-06-01 NOTE — ED Notes (Signed)
Pt extremely upset about d/c and being given a resource guide for counseling/substance abuse. Pt began screaming at this RN stating, "I don't need fucking substance abuse counseling"! The pt and family began getting up and screaming at this RN, they were both informed that the counseling/substance abuse resource guides were just linked together and no one was trying to insinuate they had a substance abuse problem. Both the pt and family member were obviously high. The pt then states, "I'm gonna get the fuck outta here before I really go the fuck off on you".

## 2016-06-01 NOTE — ED Triage Notes (Signed)
Pt reports here with his wife for further eval of depression and anxiety. Pt reports that their child who would of been four month old died yesterday and they are unable to cope.

## 2016-06-01 NOTE — ED Notes (Signed)
Pts spouse declining discharge instructions, this RN explained resource guide & encouraged to f/u with resources and to come back or call 911 if the pt exhibits SI & HI, pt left prescription here at hospital & agrees to take discharge instructions

## 2016-06-11 ENCOUNTER — Other Ambulatory Visit: Payer: Self-pay | Admitting: Internal Medicine

## 2016-06-11 ENCOUNTER — Ambulatory Visit: Payer: Self-pay | Admitting: Internal Medicine

## 2017-01-13 ENCOUNTER — Emergency Department (HOSPITAL_COMMUNITY)
Admission: EM | Admit: 2017-01-13 | Discharge: 2017-01-13 | Disposition: A | Discharge status: Home / Self Care | Payer: Self-pay | Attending: Emergency Medicine | Admitting: Emergency Medicine

## 2017-01-13 ENCOUNTER — Encounter (HOSPITAL_COMMUNITY): Payer: Self-pay | Admitting: Emergency Medicine

## 2017-01-13 DIAGNOSIS — R590 Localized enlarged lymph nodes: Secondary | ICD-10-CM | POA: Insufficient documentation

## 2017-01-13 DIAGNOSIS — Z79899 Other long term (current) drug therapy: Secondary | ICD-10-CM | POA: Insufficient documentation

## 2017-01-13 DIAGNOSIS — Z7982 Long term (current) use of aspirin: Secondary | ICD-10-CM | POA: Insufficient documentation

## 2017-01-13 DIAGNOSIS — F17200 Nicotine dependence, unspecified, uncomplicated: Secondary | ICD-10-CM | POA: Insufficient documentation

## 2017-01-13 DIAGNOSIS — R599 Enlarged lymph nodes, unspecified: Secondary | ICD-10-CM

## 2017-01-13 LAB — URINALYSIS, ROUTINE W REFLEX MICROSCOPIC
Bilirubin Urine: NEGATIVE
GLUCOSE, UA: NEGATIVE mg/dL
Hgb urine dipstick: NEGATIVE
Ketones, ur: NEGATIVE mg/dL
LEUKOCYTES UA: NEGATIVE
Nitrite: NEGATIVE
Protein, ur: NEGATIVE mg/dL
Specific Gravity, Urine: 1.017 (ref 1.005–1.030)
pH: 6 (ref 5.0–8.0)

## 2017-01-13 LAB — CBC WITH DIFFERENTIAL/PLATELET
BASOS ABS: 0 10*3/uL (ref 0.0–0.1)
Basophils Relative: 0 %
Eosinophils Absolute: 0.1 10*3/uL (ref 0.0–0.7)
Eosinophils Relative: 1 %
HCT: 43.3 % (ref 39.0–52.0)
Hemoglobin: 14.7 g/dL (ref 13.0–17.0)
Lymphocytes Relative: 36 %
Lymphs Abs: 3.8 10*3/uL (ref 0.7–4.0)
MCH: 30.6 pg (ref 26.0–34.0)
MCHC: 33.9 g/dL (ref 30.0–36.0)
MCV: 90.2 fL (ref 78.0–100.0)
Monocytes Absolute: 0.5 10*3/uL (ref 0.1–1.0)
Monocytes Relative: 5 %
NEUTROS PCT: 58 %
Neutro Abs: 6.1 10*3/uL (ref 1.7–7.7)
Platelets: 201 10*3/uL (ref 150–400)
RBC: 4.8 MIL/uL (ref 4.22–5.81)
RDW: 13.5 % (ref 11.5–15.5)
WBC: 10.5 10*3/uL (ref 4.0–10.5)

## 2017-01-13 MED ORDER — IBUPROFEN 800 MG PO TABS
800.0000 mg | ORAL_TABLET | Freq: Once | ORAL | Status: AC
  Administered 2017-01-13: 800 mg via ORAL
  Filled 2017-01-13: qty 1

## 2017-01-13 MED ORDER — IBUPROFEN 800 MG PO TABS
800.0000 mg | ORAL_TABLET | Freq: Three times a day (TID) | ORAL | 0 refills | Status: DC
Start: 2017-01-13 — End: 2019-08-03

## 2017-01-13 NOTE — ED Notes (Signed)
GC/Clamydia okay to be done off urine specimen.

## 2017-01-13 NOTE — ED Triage Notes (Signed)
Patient with a "knot" in left groin area.  He states that it showed up about a week ago, no discharge.  He states that it just hurts.

## 2017-01-13 NOTE — ED Provider Notes (Signed)
[facility] Provider Note   CSN: [id number] Arrival date & time: 01/13/17  1844  By signing my name below, I, Cynda Acres, attest that this documentation has been prepared under the direction and in the presence of Roxy Horseman, PA-C. Electronically Signed: Cynda Acres, Scribe. 01/13/17. 7:59 PM.  History   Chief Complaint Chief Complaint  Patient presents with  . Cyst   HPI Comments: Matthew Carroll is a 31 y.o. male with a history of herpes, who presents to the Emergency Department complaining of a sudden-onset "knot" to the left groin area that appeared 7 days ago. Patient states the "knot' is gradually increasing in size. Patient states he has never had this problem before. Patient reports associated pain to the left groin area and increased pain when passing a bowel movement. No modifying factors indicated. Patient states pressure makes the pain worse, nothing improves his pain. Patient denies any dysuria, fever, or penile discharge.   The history is provided by the patient. No language interpreter was used.    Past Medical History:  Diagnosis Date  . ADD (attention deficit disorder)   . Anxiety disorder   . Colitis   . Hepatitis   . Herpes simplex   . Hyperlipidemia   . Vitamin D deficiency     Patient Active Problem List   Diagnosis Date Noted  . 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 Alcohol abuse 01/29/2015  . ADD (attention deficit disorder)   . Hyperlipidemia   . Vitamin D deficiency   . Ingrown toenail 05/13/2011  . Anxiety 05/06/2011  . Diarrhea 08/01/2010  . Chronic abdominal pain 08/01/2010    Past Surgical History:  Procedure Laterality Date  . TENDON REPAIR     thumb       Home Medications    Prior to Admission medications   Medication Sig Start Date End Date Taking? Authorizing Provider  acyclovir (ZOVIRAX) 800 MG tablet Take 1 tablet (800 mg total) by mouth daily as needed (to keep in  system). Patient states he just takes it PRN to keep it in his system 12/27/14   Terri Piedra, PA-C  ALPRAZolam Prudy Feeler) 1 MG tablet Take 1 tablet (1 mg total) by mouth 3 (three) times daily as needed. 12/27/14   Courtney Forcucci, PA-C  Aspirin-Acetaminophen-Caffeine (GOODYS EXTRA STRENGTH PO) Take 2 packets by mouth every 6 (six) hours as needed (pain).    Historical Provider, MD  Cholecalciferol (VITAMIN D3) 2000 UNITS capsule Take 6,000 Units by mouth daily.    Historical Provider, MD  citalopram (CELEXA) 40 MG tablet Take 1 tablet (40 mg total) by mouth daily. 12/27/14   Courtney Forcucci, PA-C  traZODone (DESYREL) 150 MG tablet Take 1/3 (50 mg)    to     1/2 (75 mg)      to      1 tablet (150 mg)  1 hour before sleep Patient taking differently: Take 50-150 mg by mouth at bedtime. Take 1/3 (50 mg)    to     1/2 (75 mg)      to      1 tablet (150 mg)  1 hour before sleep 07/07/14   Lucky Cowboy, MD    Family History Family History  Problem Relation Age of Onset  . Diabetes Father   . Colon cancer Neg Hx     Social History Social History  Substance Use Topics  . Smoking status: Current Every Day Smoker  . Smokeless tobacco:  Never Used  . Alcohol use Yes     Allergies   Patient has no known allergies.   Review of Systems Review of Systems  Constitutional: Negative for fever.  Genitourinary: Negative for difficulty urinating, discharge and dysuria.       "knot" to the left groin.      Physical Exam Updated Vital Signs BP 133/81 (BP Location: Left Arm)   Pulse 72   Temp 98.3 F (36.8 C) (Oral)   Resp 18   SpO2 100%   Physical Exam  Constitutional: He is oriented to person, place, and time. He appears well-developed and well-nourished.  HENT:  Head: Normocephalic.  Eyes: EOM are normal.  Neck: Normal range of motion.  Pulmonary/Chest: Effort normal.  Abdominal: He exhibits no distension.  Genitourinary: No penile tenderness.  Genitourinary Comments: Two left  inguinal lymph nodes, tender to palpation.  No evidence of abscess, or cellulitis No hernia  Musculoskeletal: Normal range of motion.  Neurological: He is alert and oriented to person, place, and time.  Psychiatric: He has a normal mood and affect.  Nursing note and vitals reviewed.    ED Treatments / Results  DIAGNOSTIC STUDIES: Oxygen Saturation is 100% on RA, normal by my interpretation.    COORDINATION OF CARE: 7:59 PM Discussed treatment plan with pt at bedside and pt agreed to plan, which includes a UA and anti-inflammatory pain medication.   Labs (all labs ordered are listed, but only abnormal results are displayed) Labs Reviewed - No data to display  EKG  EKG Interpretation None       Radiology No results found.  Procedures Procedures (including critical care time)  Medications Ordered in ED Medications - No data to display   Initial Impression / Assessment and Plan / ED Course  I have reviewed the triage vital signs and the nursing notes.  Pertinent labs & imaging results that were available during my care of the patient were reviewed by me and considered in my medical decision making (see chart for details).     Patient with left inguinal lymphadenopathy. He has no dysuria. No penile discharge. Urinalysis is clear. CBC is unremarkable. Recommend warm compresses and close observation. Recommend primary care follow-up if the symptoms do not resolve in the next 7-10 days.  Final Clinical Impressions(s) / ED Diagnoses   Final diagnoses:  Swollen lymph nodes    New Prescriptions New Prescriptions   IBUPROFEN (ADVIL,MOTRIN) 800 MG TABLET    Take 1 tablet (800 mg total) by mouth 3 (three) times daily.   I personally performed the services described in this documentation, which was scribed in my presence. The recorded information has been reviewed and is accurate.      Roxy Horseman, PA-C 01/13/17 2238    Shaune Pollack, MD 01/14/17 [id number]

## 2017-01-13 NOTE — ED Notes (Signed)
Discussed with patient to call for appointment in the am.

## 2017-01-13 NOTE — ED Notes (Signed)
Pocket knife secured with security.

## 2017-11-09 ENCOUNTER — Encounter: Payer: Self-pay | Admitting: Physician Assistant

## 2017-11-09 ENCOUNTER — Ambulatory Visit (INDEPENDENT_AMBULATORY_CARE_PROVIDER_SITE_OTHER): Payer: Self-pay | Admitting: Physician Assistant

## 2017-11-09 VITALS — BP 126/80 | HR 102 | Temp 97.6°F | Resp 18 | Ht 69.0 in | Wt 177.0 lb

## 2017-11-09 DIAGNOSIS — F419 Anxiety disorder, unspecified: Secondary | ICD-10-CM

## 2017-11-09 DIAGNOSIS — F1010 Alcohol abuse, uncomplicated: Secondary | ICD-10-CM

## 2017-11-09 DIAGNOSIS — F3341 Major depressive disorder, recurrent, in partial remission: Secondary | ICD-10-CM

## 2017-11-09 DIAGNOSIS — F4325 Adjustment disorder with mixed disturbance of emotions and conduct: Secondary | ICD-10-CM

## 2017-11-09 MED ORDER — ALPRAZOLAM 0.5 MG PO TABS: ORAL_TABLET | ORAL | 0 refills | Status: DC

## 2017-11-09 MED ORDER — TRAZODONE HCL 50 MG PO TABS: ORAL_TABLET | ORAL | 2 refills | Status: DC

## 2017-11-09 MED ORDER — AMITRIPTYLINE HCL 10 MG PO TABS: ORAL_TABLET | ORAL | 0 refills | Status: DC

## 2017-11-09 MED ORDER — PAROXETINE HCL 20 MG PO TABS: 20.0000 mg | ORAL_TABLET | Freq: Every day | ORAL | 2 refills | Status: DC

## 2017-11-09 NOTE — Patient Instructions (Addendum)
[facility] Address: [address] [place], [place] [address]  Phone: [phone]   Psychological Services:  [facility] [phone]  [facility] [phone]  [place], [place]. [address] [place], ACCESS LINE: [phone] or [phone], [email or web address] Mobile Crisis Teams:  [facility]  Mobile Crisis Care Unit  [phone]  Assertive  Psychotherapeutic Services  [address] Ginette Otto  [phone]  Interventionist  [address] [place]  [address]  [place]  [phone]  Self-Help/Support Groups:  [facility] [employer] [facility] support groups  [phone] (call for more info)    Generalized Anxiety Disorder, Adult Generalized anxiety disorder (GAD) is a mental health disorder. People with this condition constantly worry about everyday events. Unlike normal anxiety, worry related to GAD is not triggered by a specific event. These worries also do not fade or get better with time. GAD interferes with life functions, including relationships, work, and school. GAD can vary from mild to severe. People with severe GAD can have intense waves of anxiety with physical symptoms (panic attacks). What are the causes? The exact cause of GAD is not known. What increases the risk? This condition is more likely to develop in:  Women.  People who have a family history of anxiety disorders.  People who are very shy.  People who experience very stressful life events, such as the death of a loved one.  People who have a very stressful family environment.  What are the signs or symptoms? People with GAD often worry excessively about many things in their lives, such as their health and family. They may also be overly concerned about:  Doing well at work.  Being on time.  Natural disasters.  Friendships.  Physical symptoms of GAD include:  Fatigue.  Muscle tension or  having muscle twitches.  Trembling or feeling shaky.  Being easily startled.  Feeling like your heart is pounding or racing.  Feeling out of breath or like you cannot take a deep breath.  Having trouble falling asleep or staying asleep.  Sweating.  Nausea, diarrhea, or irritable bowel syndrome (IBS).  Headaches.  Trouble concentrating or remembering facts.  Restlessness.  Irritability.  How is this diagnosed? Your health care provider can diagnose GAD based on your symptoms and medical history. You will also have a physical exam. The health care provider will ask specific questions about your symptoms, including how severe they are, when they started, and if they come and go. Your health care provider may ask you about your use of alcohol or drugs, including prescription medicines. Your health care provider may refer you to a mental health specialist for further evaluation. Your health care provider will do a thorough examination and may perform additional tests to rule out other possible causes of your symptoms. To be diagnosed with GAD, a person must have anxiety that:  Is out of his or her control.  Affects several different aspects of his or her life, such as work and relationships.  Causes distress that makes him or her unable to take part in normal activities.  Includes at least three physical symptoms of GAD, such as restlessness, fatigue, trouble concentrating, irritability, muscle tension, or sleep problems.  Before your health care provider can confirm a diagnosis of GAD, these symptoms must be present more days than they are not, and they must last for six months or longer. How is this treated? The following therapies are usually used to treat GAD:  Medicine. Antidepressant medicine is usually prescribed  for long-term daily control. Antianxiety medicines may be added in severe cases, especially when panic attacks occur.  Talk therapy (psychotherapy). Certain  types of talk therapy can be helpful in treating GAD by providing support, education, and guidance. Options include: ? Cognitive behavioral therapy (CBT). People learn coping skills and techniques to ease their anxiety. They learn to identify unrealistic or negative thoughts and behaviors and to replace them with positive ones. ? Acceptance and commitment therapy (ACT). This treatment teaches people how to be mindful as a way to cope with unwanted thoughts and feelings. ? Biofeedback. This process trains you to manage your body's response (physiological response) through breathing techniques and relaxation methods. You will work with a therapist while machines are used to monitor your physical symptoms.  Stress management techniques. These include yoga, meditation, and exercise.  A mental health specialist can help determine which treatment is best for you. Some people see improvement with one type of therapy. However, other people require a combination of therapies. Follow these instructions at home:  Take over-the-counter and prescription medicines only as told by your health care provider.  Try to maintain a normal routine.  Try to anticipate stressful situations and allow extra time to manage them.  Practice any stress management or self-calming techniques as taught by your health care provider.  Do not punish yourself for setbacks or for not making progress.  Try to recognize your accomplishments, even if they are small.  Keep all follow-up visits as told by your health care provider. This is important. Contact a health care provider if:  Your symptoms do not get better.  Your symptoms get worse.  You have signs of depression, such as: ? A persistently sad, cranky, or irritable mood. ? Loss of enjoyment in activities that used to bring you joy. ? Change in weight or eating. ? Changes in sleeping habits. ? Avoiding friends or family members. ? Loss of energy for normal  tasks. ? Feelings of guilt or worthlessness. Get help right away if:  You have serious thoughts about hurting yourself or others. If you ever feel like you may hurt yourself or others, or have thoughts about taking your own life, get help right away. You can go to your nearest emergency department or call:  Your local emergency services (911 in the [place]  A suicide crisis helpline, such as the National Suicide Prevention Lifeline at 562-259-8485. This is open 24 hours a day.  Summary  Generalized anxiety disorder (GAD) is a mental health disorder that involves worry that is not triggered by a specific event.  People with GAD often worry excessively about many things in their lives, such as their health and family.  GAD may cause physical symptoms such as restlessness, trouble concentrating, sleep problems, frequent sweating, nausea, diarrhea, headaches, and trembling or muscle twitching.  A mental health specialist can help determine which treatment is best for you. Some people see improvement with one type of therapy. However, other people require a combination of therapies. This information is not intended to replace advice given to you by your health care provider. Make sure you discuss any questions you have with your health care provider. Document Released: 01/24/2013 Document Revised: 08/19/2016 Document Reviewed: 08/19/2016 Elsevier Interactive Patient Education  2018 [employer].   Eye Floaters Eye floaters are specks of material that float around inside your eye. A jelly-like fluid (vitreous) fills the inside of your eye. The vitreous is normally clear. It allows light to pass through to tissues  at the back of the eye (retina). The retina contains the nerves needed for vision. Your vitreous can start to shrink and become stringy as you age. Strands of material may start to float around inside the eye. They come from clumps of cells, blood, or other materials. These objects  cast shadows on the retina and show up as floaters. Floaters may be more obvious when you look up at the sky or at a bright, blank background. They do not go away completely. In time, however, they may settle below your line of sight. Floaters can be annoying. They do not usually cause vision problems. Sometimes floaters appear along with flashes. Flashes look like bright, quick streaks of light. They usually occur at the edge of your vision. Flashes result when your vitreous pulls on your retina. They also occur with age. However, they could be a warning sign of a detached retina. This is a serious condition that requires emergency treatment to prevent vision loss. What are the causes? For most people, eye floaters develop when the vitreous begins to shrink as a normal part of aging. More serious causes of floaters include:  A torn retina.  Injury.  Bleeding inside the eye. Diabetes and other conditions can cause broken retinal blood vessels.  A blood clot in the major vein of the retina or its branches (retinal vein occlusion).  Retinal detachment.  Vitreous detachment.  Inflammation inside the eye (uveitis).  Infection inside the eye.  What increases the risk? You may have a higher risk for floaters if:  You are older.  You are nearsighted.  You have diabetes.  You have had cataracts removed.  What are the signs or symptoms? Symptoms of floaters include seeing small, shadowy shapes move across your vision. They move as your eyes move. They drift out of your vision when you keep your eyes still. These shapes may look like:  Specks.  Dots.  Circles.  Squiggly lines.  Thread.  Symptoms of flashes include seeing:  Bursts of light.  Flashing lights.  Lightning streaks.  What is commonly referred to as "stars."  How is this diagnosed? Your health care provider may diagnose floaters and flashes based on your symptoms. You may need to see an eye care specialist  (optometrist or ophthalmologist). The specialist will do an exam to determine whether your floaters are a normal part of aging or a warning sign of a more serious eye problem. The specialist may put drops in your eyes to open your pupils wide (dilate) and then use a special scope (slit lamp) to look inside your eye. How is this treated? No treatment is needed for floaters that occur normally with age. Sometimes floaters become severe enough to affect your vision. In rare cases, surgery to remove the vitreous and replace it with a saltwater solution (vitrectomy) may be considered. Follow these instructions at home: Keep all follow-up visits as directed by your health care provider. This is important. Contact a health care provider if:  You have a sudden increase in floaters.  You have floaters along with flashes.  You have floaters along with any new eye symptoms. Get help right away if:  You have a sudden increase in floaters or flashes that interferes with your vision.  Your vision suddenly changes. This information is not intended to replace advice given to you by your health care provider. Make sure you discuss any questions you have with your health care provider. Document Released: 10/02/2003 Document Revised: 03/06/2016 Document Reviewed:  05/24/2014 Elsevier Interactive Patient Education  [employer].

## 2017-11-09 NOTE — Progress Notes (Signed)
Subjective:    Patient ID: Matthew Carroll, male    DOB: September 04, 1986, 32 y.o.   MRN: [id number]  HPI 32 y.o. WM with history of anxiety/depression presents with depression. His mom is with him, visiting from [place]. . He has been out of all of his medications for a year. He was on celexa in the past which he states does not help, he has been on paxil that he states helps, he states xanax or klonopin helped the most. He has hard time sleeping.   He is really tearful, had 37 month old daughter that passed. He has 2 sons, that were taken from his for living conditions, and now under foster with GF's sister/parents. He states that he has anxiety, will freak out, feels lost, will also have outburst of anger. No night sweats, no weight loss, no diarrhea, constipation.   Went to [employer], for [employer], got out 2 Friday's ago. Has not adjusted back. He has cough, smokes. Has been going to NA meeting. Has done cocaine and alcohol in the past, will not do in the future.   He has been having right eye floaters in his periphery, will move around some, no flashing lights.   Blood pressure 126/80, pulse (!) 102, temperature 97.6 F (36.4 C), resp. rate 18, height 5\' 9"  (1.753 m), weight 177 lb (80.3 kg), SpO2 97 %.  Medications Current Outpatient Medications on File Prior to Visit  Medication Sig  . acyclovir (ZOVIRAX) 800 MG tablet Take 1 tablet (800 mg total) by mouth daily as needed (to keep in system). Patient states he just takes it PRN to keep it in his system (Patient not taking: Reported on 11/09/2017)  . ALPRAZolam (XANAX) 1 MG tablet Take 1 tablet (1 mg total) by mouth 3 (three) times daily as needed. (Patient not taking: Reported on 11/09/2017)  . Aspirin-Acetaminophen-Caffeine (GOODYS EXTRA STRENGTH PO) Take 2 packets by mouth every 6 (six) hours as needed (pain).  . Cholecalciferol (VITAMIN D3) 2000 UNITS capsule Take 6,000 Units by mouth daily.  . citalopram (CELEXA) 40 MG tablet Take  1 tablet (40 mg total) by mouth daily. (Patient not taking: Reported on 11/09/2017)  . ibuprofen (ADVIL,MOTRIN) 800 MG tablet Take 1 tablet (800 mg total) by mouth 3 (three) times daily. (Patient not taking: Reported on 11/09/2017)  . traZODone (DESYREL) 150 MG tablet Take 1/3 (50 mg)    to     1/2 (75 mg)      to      1 tablet (150 mg)  1 hour before sleep (Patient not taking: Reported on 11/09/2017)   No current facility-administered medications on file prior to visit.     Problem list He has Diarrhea; Chronic abdominal pain; Anxiety; Ingrown toenail; ADD (attention deficit disorder); Hyperlipidemia; Vitamin D deficiency; Adjustment disorder with mixed disturbance of emotions and conduct; and Alcohol abuse on their problem list.   Review of Systems See HPI    Objective:   Physical Exam  Constitutional: He is oriented to person, place, and time. He appears well-developed and well-nourished.  HENT:  Head: Normocephalic and atraumatic.  Right Ear: External ear normal.  Left Ear: External ear normal.  Mouth/Throat: Oropharynx is clear and moist.  Eyes: Conjunctivae and EOM are normal. Pupils are equal, round, and reactive to light.  Neck: Normal range of motion. Neck supple.  Cardiovascular: Normal rate, regular rhythm and normal heart sounds.  Pulmonary/Chest: Effort normal and breath sounds normal.  Abdominal: Soft. Bowel sounds are  normal.  Musculoskeletal: Normal range of motion.  Neurological: He is alert and oriented to person, place, and time. No cranial nerve deficit.  Skin: Skin is warm and dry.  Psychiatric: He has a normal mood and affect. His behavior is normal.       Assessment & Plan:   Depression, major, recurrent, in partial remission (HCC)  Anxiety  Alcohol abuse  Adjustment disorder with mixed disturbance of emotions and conduct -     PARoxetine (PAXIL) 20 MG tablet; Take 1 tablet (20 mg total) by mouth daily. -     traZODone (DESYREL) 50 MG tablet; 1/2-1  tablet for sleep -     ALPRAZolam (XANAX) 0.5 MG tablet; 1 pill as needed for panic attack daily -     amitriptyline (ELAVIL) 10 MG tablet; 1 as needed up to twice a day for anxiety   EXPLAINED TO PATIENT THAT HE NEEDS TO FOLLOW UP WITH [facility], WILL NOT BE ABLE TO PROVIDE CONTROLLED SUBSTANCES LONG TERM STRONGLY SUGGEST COUNSELING GIVEN NUMBERS NO SI/HI  Pt denies SI and is at an acute low risk for suicide. Patient told to call clinic if any problems occur. Patient advised to go to ER if they should develop SI/HI, side effects, or if symptoms worsen. Has crisis numbers to call if needed. Pt verbalized understanding.   Declines all labs

## 2017-12-08 NOTE — Progress Notes (Deleted)
Subjective:    Patient ID: Matthew Carroll, male    DOB: [birth year], 32 y.o.   MRN: [id number]  HPI 32 y.o. WM with history of anxiety/depression presents with depression. His mom is with him, visiting from [place]. . He has been out of all of his medications for a year. He was on celexa in the past which he states does not help, he has been on paxil that he states helps, he states xanax or klonopin helped the most. He has hard time sleeping.   He is really tearful, had 334 month old daughter that passed. He has 2 sons, that were taken from his for living conditions, and now under foster with GF's sister/parents. He states that he has anxiety, will freak out, feels lost, will also have outburst of anger. No night sweats, no weight loss, no diarrhea, constipation.   [facility] [employer], got out 2 Friday's ago. Has not adjusted back. He has cough, smokes. Has been going to NA meeting. Has done cocaine and alcohol in the past, will not do in the future.   He has been having right eye floaters in his periphery, will move around some, no flashing lights.   There were no vitals taken for this visit.  Medications Current Outpatient Medications on File Prior to Visit  Medication Sig  . acyclovir (ZOVIRAX) 800 MG tablet Take 1 tablet (800 mg total) by mouth daily as needed (to keep in system). Patient states he just takes it PRN to keep it in his system (Patient not taking: Reported on 11/09/2017)  . ALPRAZolam (XANAX) 0.5 MG tablet 1 pill as needed for panic attack daily  . amitriptyline (ELAVIL) 10 MG tablet 1 as needed up to twice a day for anxiety  . Aspirin-Acetaminophen-Caffeine (GOODYS EXTRA STRENGTH PO) Take 2 packets by mouth every 6 (six) hours as needed (pain).  . Cholecalciferol (VITAMIN D3) 2000 UNITS capsule Take 6,000 Units by mouth daily.  Marland Kitchen. ibuprofen (ADVIL,MOTRIN) 800 MG tablet Take 1 tablet (800 mg total) by mouth 3 (three) times daily. (Patient not taking:  Reported on 11/09/2017)  . PARoxetine (PAXIL) 20 MG tablet Take 1 tablet (20 mg total) by mouth daily.  . traZODone (DESYREL) 50 MG tablet 1/2-1 tablet for sleep   No current facility-administered medications on file prior to visit.     Problem list He has Diarrhea; Chronic abdominal pain; Anxiety; Ingrown toenail; ADD (attention deficit disorder); Hyperlipidemia; Vitamin D deficiency; Adjustment disorder with mixed disturbance of emotions and conduct; and Alcohol abuse on their problem list.   Review of Systems See HPI    Objective:   Physical Exam  Constitutional: He is oriented to person, place, and time. He appears well-developed and well-nourished.  HENT:  Head: Normocephalic and atraumatic.  Right Ear: External ear normal.  Left Ear: External ear normal.  Mouth/Throat: Oropharynx is clear and moist.  Eyes: Conjunctivae and EOM are normal. Pupils are equal, round, and reactive to light.  Neck: Normal range of motion. Neck supple.  Cardiovascular: Normal rate, regular rhythm and normal heart sounds.  Pulmonary/Chest: Effort normal and breath sounds normal.  Abdominal: Soft. Bowel sounds are normal.  Musculoskeletal: Normal range of motion.  Neurological: He is alert and oriented to person, place, and time. No cranial nerve deficit.  Skin: Skin is warm and dry.  Psychiatric: He has a normal mood and affect. His behavior is normal.       Assessment & Plan:   Depression, major, recurrent,  in partial remission (HCC)  Anxiety  Alcohol abuse  Adjustment disorder with mixed disturbance of emotions and conduct -     PARoxetine (PAXIL) 20 MG tablet; Take 1 tablet (20 mg total) by mouth daily. -     traZODone (DESYREL) 50 MG tablet; 1/2-1 tablet for sleep -     ALPRAZolam (XANAX) 0.5 MG tablet; 1 pill as needed for panic attack daily -     amitriptyline (ELAVIL) 10 MG tablet; 1 as needed up to twice a day for anxiety   EXPLAINED TO PATIENT THAT HE NEEDS TO FOLLOW UP WITH  [facility], WILL NOT BE ABLE TO PROVIDE CONTROLLED SUBSTANCES LONG TERM STRONGLY SUGGEST COUNSELING GIVEN NUMBERS NO SI/HI  Pt denies SI and is at an acute low risk for suicide. Patient told to call clinic if any problems occur. Patient advised to go to ER if they should develop SI/HI, side effects, or if symptoms worsen. Has crisis numbers to call if needed. Pt verbalized understanding.   Declines all labs

## 2017-12-10 ENCOUNTER — Ambulatory Visit: Payer: Self-pay | Admitting: Physician Assistant

## 2019-01-12 DIAGNOSIS — L0390 Cellulitis, unspecified: Secondary | ICD-10-CM

## 2019-01-12 HISTORY — DX: Cellulitis, unspecified: L03.90

## 2019-02-02 ENCOUNTER — Inpatient Hospital Stay (HOSPITAL_COMMUNITY)
Admission: EM | Admit: 2019-02-02 | Discharge: 2019-02-05 | DRG: 854 | Discharge status: Against Medical Advice | Payer: Self-pay | Attending: Family Medicine | Admitting: Family Medicine

## 2019-02-02 ENCOUNTER — Encounter (HOSPITAL_COMMUNITY): Payer: Self-pay | Admitting: Emergency Medicine

## 2019-02-02 ENCOUNTER — Emergency Department (HOSPITAL_COMMUNITY): Payer: Self-pay

## 2019-02-02 ENCOUNTER — Other Ambulatory Visit: Payer: Self-pay

## 2019-02-02 DIAGNOSIS — R740 Nonspecific elevation of levels of transaminase and lactic acid dehydrogenase [LDH]: Secondary | ICD-10-CM | POA: Diagnosis present

## 2019-02-02 DIAGNOSIS — Z5329 Procedure and treatment not carried out because of patient's decision for other reasons: Secondary | ICD-10-CM | POA: Diagnosis not present

## 2019-02-02 DIAGNOSIS — L03113 Cellulitis of right upper limb: Secondary | ICD-10-CM

## 2019-02-02 DIAGNOSIS — O99320 Drug use complicating pregnancy, unspecified trimester: Secondary | ICD-10-CM | POA: Diagnosis present

## 2019-02-02 DIAGNOSIS — E559 Vitamin D deficiency, unspecified: Secondary | ICD-10-CM | POA: Diagnosis present

## 2019-02-02 DIAGNOSIS — F17210 Nicotine dependence, cigarettes, uncomplicated: Secondary | ICD-10-CM | POA: Diagnosis present

## 2019-02-02 DIAGNOSIS — F419 Anxiety disorder, unspecified: Secondary | ICD-10-CM | POA: Diagnosis present

## 2019-02-02 DIAGNOSIS — Z23 Encounter for immunization: Secondary | ICD-10-CM

## 2019-02-02 DIAGNOSIS — F1910 Other psychoactive substance abuse, uncomplicated: Secondary | ICD-10-CM | POA: Diagnosis present

## 2019-02-02 DIAGNOSIS — L02419 Cutaneous abscess of limb, unspecified: Secondary | ICD-10-CM

## 2019-02-02 DIAGNOSIS — L02413 Cutaneous abscess of right upper limb: Secondary | ICD-10-CM | POA: Diagnosis present

## 2019-02-02 DIAGNOSIS — Z20828 Contact with and (suspected) exposure to other viral communicable diseases: Secondary | ICD-10-CM | POA: Diagnosis present

## 2019-02-02 DIAGNOSIS — A419 Sepsis, unspecified organism: Principal | ICD-10-CM | POA: Diagnosis present

## 2019-02-02 DIAGNOSIS — Z590 Homelessness: Secondary | ICD-10-CM

## 2019-02-02 DIAGNOSIS — E871 Hypo-osmolality and hyponatremia: Secondary | ICD-10-CM | POA: Diagnosis present

## 2019-02-02 DIAGNOSIS — F1410 Cocaine abuse, uncomplicated: Secondary | ICD-10-CM | POA: Diagnosis present

## 2019-02-02 DIAGNOSIS — F17200 Nicotine dependence, unspecified, uncomplicated: Secondary | ICD-10-CM

## 2019-02-02 DIAGNOSIS — F1110 Opioid abuse, uncomplicated: Secondary | ICD-10-CM | POA: Diagnosis present

## 2019-02-02 DIAGNOSIS — F40240 Claustrophobia: Secondary | ICD-10-CM | POA: Diagnosis present

## 2019-02-02 HISTORY — DX: Cellulitis, unspecified: L03.90

## 2019-02-02 LAB — COMPREHENSIVE METABOLIC PANEL
ALT: 67 U/L — ABNORMAL HIGH (ref 0–44)
AST: 45 U/L — ABNORMAL HIGH (ref 15–41)
Albumin: 3.3 g/dL — ABNORMAL LOW (ref 3.5–5.0)
Alkaline Phosphatase: 46 U/L (ref 38–126)
Anion gap: 13 (ref 5–15)
BUN: 12 mg/dL (ref 6–20)
CO2: 23 mmol/L (ref 22–32)
Calcium: 8.3 mg/dL — ABNORMAL LOW (ref 8.9–10.3)
Chloride: 97 mmol/L — ABNORMAL LOW (ref 98–111)
Creatinine, Ser: 0.97 mg/dL (ref 0.61–1.24)
GFR calc Af Amer: >60 mL/min (ref >60)
GFR calc non Af Amer: >60 mL/min (ref >60)
Glucose, Bld: 107 mg/dL — ABNORMAL HIGH (ref 70–99)
Potassium: 3.8 mmol/L (ref 3.5–5.1)
Sodium: 133 mmol/L — ABNORMAL LOW (ref 135–145)
Total Bilirubin: 1.2 mg/dL (ref 0.3–1.2)
Total Protein: 6.9 g/dL (ref 6.5–8.1)

## 2019-02-02 LAB — CBC WITH DIFFERENTIAL/PLATELET
Abs Immature Granulocytes: 0.09 10*3/uL — ABNORMAL HIGH (ref 0.00–0.07)
Basophils Absolute: 0 10*3/uL (ref 0.0–0.1)
Basophils Relative: 0 %
Eosinophils Absolute: 0 10*3/uL (ref 0.0–0.5)
Eosinophils Relative: 0 %
HCT: 38.8 % — ABNORMAL LOW (ref 39.0–52.0)
Hemoglobin: 13.1 g/dL (ref 13.0–17.0)
Immature Granulocytes: 1 %
Lymphocytes Relative: 13 %
Lymphs Abs: 2.1 10*3/uL (ref 0.7–4.0)
MCH: 30.8 pg (ref 26.0–34.0)
MCHC: 33.8 g/dL (ref 30.0–36.0)
MCV: 91.1 fL (ref 80.0–100.0)
Monocytes Absolute: 2 10*3/uL — ABNORMAL HIGH (ref 0.1–1.0)
Monocytes Relative: 12 %
Neutro Abs: 12.3 10*3/uL — ABNORMAL HIGH (ref 1.7–7.7)
Neutrophils Relative %: 74 %
Platelets: 236 10*3/uL (ref 150–400)
RBC: 4.26 MIL/uL (ref 4.22–5.81)
RDW: 12.5 % (ref 11.5–15.5)
WBC: 16.5 10*3/uL — ABNORMAL HIGH (ref 4.0–10.5)
nRBC: 0 % (ref 0.0–0.2)

## 2019-02-02 LAB — LACTIC ACID, PLASMA: Lactic Acid, Venous: 1.2 mmol/L (ref 0.5–1.9)

## 2019-02-02 MED ORDER — LACTATED RINGERS IV BOLUS (SEPSIS): 1000.0000 mL | Freq: Once | INTRAVENOUS | Status: DC

## 2019-02-02 MED ORDER — METRONIDAZOLE IN NACL 5-0.79 MG/ML-% IV SOLN
500.0000 mg | Freq: Once | INTRAVENOUS | Status: AC
  Administered 2019-02-03: 01:00:00 500 mg via INTRAVENOUS
  Filled 2019-02-02: qty 100

## 2019-02-02 MED ORDER — SODIUM CHLORIDE 0.9% FLUSH: 3.0000 mL | Freq: Once | INTRAVENOUS | Status: DC

## 2019-02-02 MED ORDER — SODIUM CHLORIDE 0.9% FLUSH
3.0000 mL | Freq: Once | INTRAVENOUS | Status: DC
  Administered 2019-02-03: 3 mL via INTRAVENOUS

## 2019-02-02 MED ORDER — LACTATED RINGERS IV BOLUS (SEPSIS): 500.0000 mL | Freq: Once | INTRAVENOUS | Status: DC

## 2019-02-02 MED ORDER — SODIUM CHLORIDE 0.9 % IV SOLN
2.0000 g | Freq: Once | INTRAVENOUS | Status: AC
  Administered 2019-02-03: 2 g via INTRAVENOUS
  Filled 2019-02-02: qty 2

## 2019-02-02 MED ORDER — ACETAMINOPHEN 325 MG PO TABS
650.0000 mg | ORAL_TABLET | Freq: Once | ORAL | Status: AC | PRN
  Administered 2019-02-02: 23:00:00 650 mg via ORAL
  Filled 2019-02-02: qty 2

## 2019-02-02 MED ORDER — VANCOMYCIN HCL IN DEXTROSE 1-5 GM/200ML-% IV SOLN
1000.0000 mg | Freq: Once | INTRAVENOUS | Status: AC
  Administered 2019-02-03: 01:00:00 1000 mg via INTRAVENOUS
  Filled 2019-02-02: qty 200

## 2019-02-02 NOTE — ED Triage Notes (Signed)
Pt has large abscess to R posterior forearm with necrotic tissue.  Extensive swelling and redness to forearm down to hand.  Pt reports he believes he was bit by a spider 3-4 days ago.  Questioned IV drug use and pt states he has used IV drugs but doesn't think that is what this is from.

## 2019-02-03 ENCOUNTER — Encounter (HOSPITAL_COMMUNITY): Payer: Self-pay | Admitting: General Practice

## 2019-02-03 ENCOUNTER — Other Ambulatory Visit: Payer: Self-pay

## 2019-02-03 ENCOUNTER — Inpatient Hospital Stay (HOSPITAL_COMMUNITY): Payer: Self-pay | Admitting: Certified Registered Nurse Anesthetist

## 2019-02-03 ENCOUNTER — Encounter (HOSPITAL_COMMUNITY)
Admission: EM | Discharge status: Against Medical Advice | Payer: Self-pay | Source: Home / Self Care | Attending: Family Medicine

## 2019-02-03 DIAGNOSIS — L03113 Cellulitis of right upper limb: Secondary | ICD-10-CM

## 2019-02-03 DIAGNOSIS — A419 Sepsis, unspecified organism: Principal | ICD-10-CM

## 2019-02-03 DIAGNOSIS — F1110 Opioid abuse, uncomplicated: Secondary | ICD-10-CM | POA: Diagnosis present

## 2019-02-03 DIAGNOSIS — L02419 Cutaneous abscess of limb, unspecified: Secondary | ICD-10-CM | POA: Diagnosis present

## 2019-02-03 DIAGNOSIS — F1910 Other psychoactive substance abuse, uncomplicated: Secondary | ICD-10-CM | POA: Diagnosis present

## 2019-02-03 DIAGNOSIS — O99320 Drug use complicating pregnancy, unspecified trimester: Secondary | ICD-10-CM | POA: Diagnosis present

## 2019-02-03 HISTORY — PX: I&D EXTREMITY: SHX5045

## 2019-02-03 LAB — COMPREHENSIVE METABOLIC PANEL
ALT: 61 U/L — ABNORMAL HIGH (ref 0–44)
AST: 39 U/L (ref 15–41)
Albumin: 3.1 g/dL — ABNORMAL LOW (ref 3.5–5.0)
Alkaline Phosphatase: 53 U/L (ref 38–126)
Anion gap: 11 (ref 5–15)
BUN: 9 mg/dL (ref 6–20)
CO2: 24 mmol/L (ref 22–32)
Calcium: 8.2 mg/dL — ABNORMAL LOW (ref 8.9–10.3)
Chloride: 103 mmol/L (ref 98–111)
Creatinine, Ser: 0.93 mg/dL (ref 0.61–1.24)
GFR calc Af Amer: >60 mL/min (ref >60)
GFR calc non Af Amer: >60 mL/min (ref >60)
Glucose, Bld: 105 mg/dL — ABNORMAL HIGH (ref 70–99)
Potassium: 3.8 mmol/L (ref 3.5–5.1)
Sodium: 138 mmol/L (ref 135–145)
Total Bilirubin: 0.9 mg/dL (ref 0.3–1.2)
Total Protein: 6.5 g/dL (ref 6.5–8.1)

## 2019-02-03 LAB — CBC WITH DIFFERENTIAL/PLATELET
Abs Immature Granulocytes: 0.09 10*3/uL — ABNORMAL HIGH (ref 0.00–0.07)
Basophils Absolute: 0.1 10*3/uL (ref 0.0–0.1)
Basophils Relative: 0 %
Eosinophils Absolute: 0 10*3/uL (ref 0.0–0.5)
Eosinophils Relative: 0 %
HCT: 38.2 % — ABNORMAL LOW (ref 39.0–52.0)
Hemoglobin: 13.4 g/dL (ref 13.0–17.0)
Immature Granulocytes: 1 %
Lymphocytes Relative: 17 %
Lymphs Abs: 3 10*3/uL (ref 0.7–4.0)
MCH: 31.3 pg (ref 26.0–34.0)
MCHC: 35.1 g/dL (ref 30.0–36.0)
MCV: 89.3 fL (ref 80.0–100.0)
Monocytes Absolute: 2.3 10*3/uL — ABNORMAL HIGH (ref 0.1–1.0)
Monocytes Relative: 13 %
Neutro Abs: 12.5 10*3/uL — ABNORMAL HIGH (ref 1.7–7.7)
Neutrophils Relative %: 69 %
Platelets: 241 10*3/uL (ref 150–400)
RBC: 4.28 MIL/uL (ref 4.22–5.81)
RDW: 12.5 % (ref 11.5–15.5)
WBC: 17.9 10*3/uL — ABNORMAL HIGH (ref 4.0–10.5)
nRBC: 0 % (ref 0.0–0.2)

## 2019-02-03 LAB — SARS CORONAVIRUS 2 BY RT PCR (HOSPITAL ORDER, PERFORMED IN ~~LOC~~ HOSPITAL LAB): SARS Coronavirus 2: NEGATIVE

## 2019-02-03 LAB — SURGICAL PCR SCREEN
MRSA, PCR: NEGATIVE
Staphylococcus aureus: POSITIVE — AB

## 2019-02-03 LAB — RAPID URINE DRUG SCREEN, HOSP PERFORMED
Amphetamines: NOT DETECTED
Barbiturates: NOT DETECTED
Benzodiazepines: POSITIVE — AB
Cocaine: POSITIVE — AB
Opiates: NOT DETECTED
Tetrahydrocannabinol: NOT DETECTED

## 2019-02-03 LAB — URINALYSIS, ROUTINE W REFLEX MICROSCOPIC
Bilirubin Urine: NEGATIVE
Glucose, UA: NEGATIVE mg/dL
Hgb urine dipstick: NEGATIVE
Ketones, ur: NEGATIVE mg/dL
Leukocytes,Ua: NEGATIVE
Nitrite: NEGATIVE
Protein, ur: NEGATIVE mg/dL
Specific Gravity, Urine: 1.006 (ref 1.005–1.030)
pH: 7 (ref 5.0–8.0)

## 2019-02-03 LAB — PROTIME-INR
INR: 1.1 (ref 0.8–1.2)
Prothrombin Time: 14.3 seconds (ref 11.4–15.2)

## 2019-02-03 LAB — HIV ANTIBODY (ROUTINE TESTING W REFLEX): HIV Screen 4th Generation wRfx: NONREACTIVE

## 2019-02-03 SURGERY — IRRIGATION AND DEBRIDEMENT EXTREMITY
Anesthesia: General | Laterality: Right

## 2019-02-03 MED ORDER — FENTANYL CITRATE (PF) 100 MCG/2ML IJ SOLN
INTRAMUSCULAR | Status: AC
  Administered 2019-02-03: 16:00:00 50 ug via INTRAVENOUS
  Filled 2019-02-03: qty 2

## 2019-02-03 MED ORDER — PHENYLEPHRINE 40 MCG/ML (10ML) SYRINGE FOR IV PUSH (FOR BLOOD PRESSURE SUPPORT)
PREFILLED_SYRINGE | INTRAVENOUS | Status: AC
  Filled 2019-02-03: qty 10

## 2019-02-03 MED ORDER — MORPHINE SULFATE (PF) 2 MG/ML IV SOLN
2.0000 mg | INTRAVENOUS | Status: DC | PRN
  Filled 2019-02-03: qty 1

## 2019-02-03 MED ORDER — MUPIROCIN 2 % EX OINT
1.0000 "application " | TOPICAL_OINTMENT | Freq: Two times a day (BID) | CUTANEOUS | Status: DC
  Administered 2019-02-03 – 2019-02-05 (×5): 1 via NASAL
  Filled 2019-02-03: qty 22

## 2019-02-03 MED ORDER — POLYETHYLENE GLYCOL 3350 17 G PO PACK: 17.0000 g | PACK | Freq: Every day | ORAL | Status: DC | PRN

## 2019-02-03 MED ORDER — LIDOCAINE 2% (20 MG/ML) 5 ML SYRINGE
INTRAMUSCULAR | Status: AC
  Filled 2019-02-03: qty 10

## 2019-02-03 MED ORDER — DEXAMETHASONE SODIUM PHOSPHATE 10 MG/ML IJ SOLN
INTRAMUSCULAR | Status: AC
  Filled 2019-02-03: qty 1

## 2019-02-03 MED ORDER — KETOROLAC TROMETHAMINE 30 MG/ML IJ SOLN
INTRAMUSCULAR | Status: AC
  Filled 2019-02-03: qty 1

## 2019-02-03 MED ORDER — CHLORHEXIDINE GLUCONATE CLOTH 2 % EX PADS
6.0000 | MEDICATED_PAD | Freq: Every day | CUTANEOUS | Status: DC
  Administered 2019-02-03 – 2019-02-05 (×4): 6 via TOPICAL

## 2019-02-03 MED ORDER — HYDROMORPHONE HCL 1 MG/ML IJ SOLN
0.2500 mg | INTRAMUSCULAR | Status: DC | PRN
  Administered 2019-02-03 (×4): 0.5 mg via INTRAVENOUS

## 2019-02-03 MED ORDER — SODIUM CHLORIDE 0.9 % IV BOLUS
500.0000 mL | Freq: Once | INTRAVENOUS | Status: AC
  Administered 2019-02-03: 500 mL via INTRAVENOUS

## 2019-02-03 MED ORDER — SUCCINYLCHOLINE CHLORIDE 20 MG/ML IJ SOLN
INTRAMUSCULAR | Status: DC | PRN
  Administered 2019-02-03: 120 mg via INTRAVENOUS

## 2019-02-03 MED ORDER — PROPOFOL 10 MG/ML IV BOLUS
INTRAVENOUS | Status: DC | PRN
  Administered 2019-02-03: 200 mg via INTRAVENOUS

## 2019-02-03 MED ORDER — EPHEDRINE 5 MG/ML INJ
INTRAVENOUS | Status: AC
  Filled 2019-02-03: qty 10

## 2019-02-03 MED ORDER — TETANUS-DIPHTH-ACELL PERTUSSIS 5-2.5-18.5 LF-MCG/0.5 IM SUSP
0.5000 mL | Freq: Once | INTRAMUSCULAR | Status: AC
  Administered 2019-02-03: 23:00:00 0.5 mL via INTRAMUSCULAR
  Filled 2019-02-03 (×2): qty 0.5

## 2019-02-03 MED ORDER — DEXAMETHASONE SODIUM PHOSPHATE 10 MG/ML IJ SOLN
INTRAMUSCULAR | Status: DC | PRN
  Administered 2019-02-03: 5 mg via INTRAVENOUS

## 2019-02-03 MED ORDER — FENTANYL CITRATE (PF) 250 MCG/5ML IJ SOLN
INTRAMUSCULAR | Status: AC
  Filled 2019-02-03: qty 5

## 2019-02-03 MED ORDER — FENTANYL CITRATE (PF) 250 MCG/5ML IJ SOLN
INTRAMUSCULAR | Status: DC | PRN
  Administered 2019-02-03 (×2): 100 ug via INTRAVENOUS
  Administered 2019-02-03: 150 ug via INTRAVENOUS
  Administered 2019-02-03: 50 ug via INTRAVENOUS
  Administered 2019-02-03: 100 ug via INTRAVENOUS

## 2019-02-03 MED ORDER — ONDANSETRON HCL 4 MG/2ML IJ SOLN
INTRAMUSCULAR | Status: DC | PRN
  Administered 2019-02-03: 4 mg via INTRAVENOUS

## 2019-02-03 MED ORDER — MIDAZOLAM HCL 2 MG/2ML IJ SOLN
INTRAMUSCULAR | Status: AC
  Filled 2019-02-03: qty 2

## 2019-02-03 MED ORDER — ADULT MULTIVITAMIN W/MINERALS CH
1.0000 | ORAL_TABLET | Freq: Every day | ORAL | Status: DC
  Administered 2019-02-04 – 2019-02-05 (×2): 1 via ORAL
  Filled 2019-02-03 (×2): qty 1

## 2019-02-03 MED ORDER — MORPHINE SULFATE (PF) 4 MG/ML IV SOLN
4.0000 mg | Freq: Four times a day (QID) | INTRAVENOUS | Status: DC | PRN
  Administered 2019-02-03 – 2019-02-04 (×4): 4 mg via INTRAVENOUS
  Filled 2019-02-03 (×4): qty 1

## 2019-02-03 MED ORDER — ACETAMINOPHEN 325 MG PO TABS
650.0000 mg | ORAL_TABLET | Freq: Four times a day (QID) | ORAL | Status: DC | PRN
  Filled 2019-02-03: qty 2

## 2019-02-03 MED ORDER — LACTATED RINGERS IV SOLN
INTRAVENOUS | Status: DC
  Administered 2019-02-03 – 2019-02-04 (×2): via INTRAVENOUS

## 2019-02-03 MED ORDER — ARTIFICIAL TEARS OPHTHALMIC OINT
TOPICAL_OINTMENT | OPHTHALMIC | Status: AC
  Filled 2019-02-03: qty 3.5

## 2019-02-03 MED ORDER — MORPHINE SULFATE (PF) 2 MG/ML IV SOLN
2.0000 mg | Freq: Four times a day (QID) | INTRAVENOUS | Status: DC | PRN
  Administered 2019-02-03 (×2): 2 mg via INTRAVENOUS
  Filled 2019-02-03: qty 1

## 2019-02-03 MED ORDER — IBUPROFEN 600 MG PO TABS
600.0000 mg | ORAL_TABLET | Freq: Four times a day (QID) | ORAL | Status: DC | PRN
  Administered 2019-02-03 – 2019-02-05 (×4): 600 mg via ORAL
  Filled 2019-02-03 (×5): qty 1

## 2019-02-03 MED ORDER — HYDROMORPHONE HCL 1 MG/ML IJ SOLN
INTRAMUSCULAR | Status: AC
  Filled 2019-02-03: qty 2

## 2019-02-03 MED ORDER — SODIUM CHLORIDE (PF) 0.9 % IJ SOLN
INTRAMUSCULAR | Status: AC
  Filled 2019-02-03: qty 10

## 2019-02-03 MED ORDER — PAROXETINE HCL 10 MG PO TABS
10.0000 mg | ORAL_TABLET | Freq: Every day | ORAL | Status: DC
  Administered 2019-02-04 – 2019-02-05 (×2): 10 mg via ORAL
  Filled 2019-02-03 (×3): qty 1

## 2019-02-03 MED ORDER — PROMETHAZINE HCL 25 MG/ML IJ SOLN: 6.2500 mg | INTRAMUSCULAR | Status: DC | PRN

## 2019-02-03 MED ORDER — ONDANSETRON HCL 4 MG/2ML IJ SOLN
INTRAMUSCULAR | Status: AC
  Filled 2019-02-03: qty 2

## 2019-02-03 MED ORDER — NICOTINE 14 MG/24HR TD PT24
14.0000 mg | MEDICATED_PATCH | Freq: Every day | TRANSDERMAL | Status: DC
  Administered 2019-02-04 – 2019-02-05 (×2): 14 mg via TRANSDERMAL
  Filled 2019-02-03 (×2): qty 1

## 2019-02-03 MED ORDER — SODIUM CHLORIDE 0.9 % IV BOLUS
1000.0000 mL | Freq: Once | INTRAVENOUS | Status: AC
  Administered 2019-02-03: 1000 mL via INTRAVENOUS

## 2019-02-03 MED ORDER — SODIUM CHLORIDE 0.9 % IV BOLUS
1000.0000 mL | Freq: Once | INTRAVENOUS | Status: AC
  Administered 2019-02-03: 01:00:00 1000 mL via INTRAVENOUS

## 2019-02-03 MED ORDER — DEXAMETHASONE SODIUM PHOSPHATE 10 MG/ML IJ SOLN
INTRAMUSCULAR | Status: AC
  Filled 2019-02-03: qty 4

## 2019-02-03 MED ORDER — ACETAMINOPHEN 650 MG RE SUPP: 650.0000 mg | Freq: Four times a day (QID) | RECTAL | Status: DC | PRN

## 2019-02-03 MED ORDER — LIDOCAINE 2% (20 MG/ML) 5 ML SYRINGE
INTRAMUSCULAR | Status: DC | PRN
  Administered 2019-02-03: 100 mg via INTRAVENOUS

## 2019-02-03 MED ORDER — VANCOMYCIN HCL IN DEXTROSE 1-5 GM/200ML-% IV SOLN
1000.0000 mg | Freq: Three times a day (TID) | INTRAVENOUS | Status: DC
  Administered 2019-02-03 – 2019-02-05 (×7): 1000 mg via INTRAVENOUS
  Filled 2019-02-03 (×14): qty 200

## 2019-02-03 MED ORDER — MIDAZOLAM HCL 2 MG/2ML IJ SOLN
INTRAMUSCULAR | Status: DC | PRN
  Administered 2019-02-03: 2 mg via INTRAVENOUS

## 2019-02-03 MED ORDER — FENTANYL CITRATE (PF) 100 MCG/2ML IJ SOLN
50.0000 ug | Freq: Once | INTRAMUSCULAR | Status: AC
  Administered 2019-02-03 (×2): 50 ug via INTRAVENOUS

## 2019-02-03 MED ORDER — SODIUM CHLORIDE 0.9 % IV SOLN
2.0000 g | Freq: Three times a day (TID) | INTRAVENOUS | Status: DC
  Administered 2019-02-03 – 2019-02-04 (×5): 2 g via INTRAVENOUS
  Filled 2019-02-03 (×7): qty 2

## 2019-02-03 MED ORDER — SUCCINYLCHOLINE CHLORIDE 200 MG/10ML IV SOSY
PREFILLED_SYRINGE | INTRAVENOUS | Status: AC
  Filled 2019-02-03: qty 30

## 2019-02-03 MED ORDER — ONDANSETRON HCL 4 MG/2ML IJ SOLN
INTRAMUSCULAR | Status: AC
  Filled 2019-02-03: qty 6

## 2019-02-03 SURGICAL SUPPLY — 57 items
BANDAGE ACE 3X5.8 VEL STRL LF (GAUZE/BANDAGES/DRESSINGS) ×3 IMPLANT
BANDAGE ACE 4X5 VEL STRL LF (GAUZE/BANDAGES/DRESSINGS) ×3 IMPLANT
BNDG CMPR 9X4 STRL LF SNTH (GAUZE/BANDAGES/DRESSINGS) ×1
BNDG COHESIVE 1X5 TAN STRL LF (GAUZE/BANDAGES/DRESSINGS) IMPLANT
BNDG CONFORM 2 STRL LF (GAUZE/BANDAGES/DRESSINGS) IMPLANT
BNDG ESMARK 4X9 LF (GAUZE/BANDAGES/DRESSINGS) ×3 IMPLANT
BNDG GAUZE ELAST 4 BULKY (GAUZE/BANDAGES/DRESSINGS) ×3 IMPLANT
CORDS BIPOLAR (ELECTRODE) ×3 IMPLANT
COVER SURGICAL LIGHT HANDLE (MISCELLANEOUS) ×3 IMPLANT
COVER WAND RF STERILE (DRAPES) ×3 IMPLANT
CUFF TOURNIQUET SINGLE 18IN (TOURNIQUET CUFF) ×3 IMPLANT
CUFF TOURNIQUET SINGLE 24IN (TOURNIQUET CUFF) IMPLANT
DRAIN PENROSE 1/4X12 LTX STRL (WOUND CARE) IMPLANT
DRAPE SURG 17X23 STRL (DRAPES) ×3 IMPLANT
DRSG ADAPTIC 3X8 NADH LF (GAUZE/BANDAGES/DRESSINGS) ×3 IMPLANT
ELECT REM PT RETURN 9FT ADLT (ELECTROSURGICAL)
ELECTRODE REM PT RTRN 9FT ADLT (ELECTROSURGICAL) IMPLANT
GAUZE SPONGE 4X4 12PLY STRL (GAUZE/BANDAGES/DRESSINGS) ×3 IMPLANT
GAUZE XEROFORM 1X8 LF (GAUZE/BANDAGES/DRESSINGS) ×3 IMPLANT
GAUZE XEROFORM 5X9 LF (GAUZE/BANDAGES/DRESSINGS) IMPLANT
GLOVE BIOGEL PI IND STRL 8.5 (GLOVE) ×1 IMPLANT
GLOVE BIOGEL PI INDICATOR 8.5 (GLOVE) ×2
GLOVE SURG ORTHO 8.0 STRL STRW (GLOVE) ×3 IMPLANT
GOWN STRL REUS W/ TWL LRG LVL3 (GOWN DISPOSABLE) ×3 IMPLANT
GOWN STRL REUS W/ TWL XL LVL3 (GOWN DISPOSABLE) ×1 IMPLANT
GOWN STRL REUS W/TWL LRG LVL3 (GOWN DISPOSABLE) ×9
GOWN STRL REUS W/TWL XL LVL3 (GOWN DISPOSABLE) ×3
KIT BASIN OR (CUSTOM PROCEDURE TRAY) ×3 IMPLANT
KIT TURNOVER KIT B (KITS) ×3 IMPLANT
MANIFOLD NEPTUNE II (INSTRUMENTS) ×3 IMPLANT
NDL HYPO 25GX1X1/2 BEV (NEEDLE) IMPLANT
NEEDLE HYPO 25GX1X1/2 BEV (NEEDLE) IMPLANT
NS IRRIG 1000ML POUR BTL (IV SOLUTION) ×3 IMPLANT
PACK ORTHO EXTREMITY (CUSTOM PROCEDURE TRAY) ×3 IMPLANT
PAD ARMBOARD 7.5X6 YLW CONV (MISCELLANEOUS) ×6 IMPLANT
PAD CAST 3X4 CTTN HI CHSV (CAST SUPPLIES) IMPLANT
PAD CAST 4YDX4 CTTN HI CHSV (CAST SUPPLIES) ×1 IMPLANT
PADDING CAST COTTON 3X4 STRL (CAST SUPPLIES) ×3
PADDING CAST COTTON 4X4 STRL (CAST SUPPLIES) ×3
SET CYSTO W/LG BORE CLAMP LF (SET/KITS/TRAYS/PACK) ×2 IMPLANT
SOAP 2 % CHG 4 OZ (WOUND CARE) ×3 IMPLANT
SPONGE LAP 18X18 RF (DISPOSABLE) ×3 IMPLANT
SPONGE LAP 4X18 RFD (DISPOSABLE) ×3 IMPLANT
SUT ETHIBOND 2 0 FS (SUTURE) ×4 IMPLANT
SUT ETHILON 4 0 PS 2 18 (SUTURE) IMPLANT
SUT ETHILON 5 0 P 3 18 (SUTURE)
SUT NYLON ETHILON 5-0 P-3 1X18 (SUTURE) IMPLANT
SWAB COLLECTION DEVICE MRSA (MISCELLANEOUS) ×1 IMPLANT
SWAB CULTURE ESWAB REG 1ML (MISCELLANEOUS) IMPLANT
SYR CONTROL 10ML LL (SYRINGE) IMPLANT
TOWEL OR 17X24 6PK STRL BLUE (TOWEL DISPOSABLE) ×3 IMPLANT
TOWEL OR 17X26 10 PK STRL BLUE (TOWEL DISPOSABLE) ×3 IMPLANT
TUBE CONNECTING 12'X1/4 (SUCTIONS) ×1
TUBE CONNECTING 12X1/4 (SUCTIONS) ×2 IMPLANT
UNDERPAD 30X30 (UNDERPADS AND DIAPERS) ×3 IMPLANT
WATER STERILE IRR 1000ML POUR (IV SOLUTION) ×3 IMPLANT
YANKAUER SUCT BULB TIP NO VENT (SUCTIONS) ×3 IMPLANT

## 2019-02-03 NOTE — ED Notes (Signed)
Pt refused to allow staff to draw a mark around the abscess.

## 2019-02-03 NOTE — Anesthesia Preprocedure Evaluation (Signed)
Anesthesia Evaluation  Patient identified by MRN, date of birth, ID band Patient awake    Reviewed: Allergy & Precautions, NPO status , Patient's Chart, lab work & pertinent test results  Airway Mallampati: II  TM Distance: >3 FB Neck ROM: Full    Dental   Pulmonary Current Smoker,    breath sounds clear to auscultation       Cardiovascular negative cardio ROS   Rhythm:Regular Rate:Normal     Neuro/Psych negative neurological ROS     GI/Hepatic negative GI ROS, (+)     substance abuse  IV drug use, Hepatitis -  Endo/Other  negative endocrine ROS  Renal/GU negative Renal ROS     Musculoskeletal  (+) narcotic dependent  Abdominal   Peds  Hematology negative hematology ROS (+)   Anesthesia Other Findings   Reproductive/Obstetrics                             Lab Results  Component Value Date   WBC 17.9 (H) 02/03/2019   HGB 13.4 02/03/2019   HCT 38.2 (L) 02/03/2019   MCV 89.3 02/03/2019   PLT 241 02/03/2019   Lab Results  Component Value Date   CREATININE 0.93 02/03/2019   BUN 9 02/03/2019   NA 138 02/03/2019   K 3.8 02/03/2019   CL 103 02/03/2019   CO2 24 02/03/2019    Anesthesia Physical Anesthesia Plan  ASA: II  Anesthesia Plan: General   Post-op Pain Management:    Induction: Intravenous and Rapid sequence  PONV Risk Score and Plan: 1 and Ondansetron, Dexamethasone and Treatment may vary due to age or medical condition  Airway Management Planned: Oral ETT  Additional Equipment:   Intra-op Plan:   Post-operative Plan: Extubation in OR  Informed Consent: I have reviewed the patients History and Physical, chart, labs and discussed the procedure including the risks, benefits and alternatives for the proposed anesthesia with the patient or authorized representative who has indicated his/her understanding and acceptance.     Dental advisory given  Plan  Discussed with: CRNA  Anesthesia Plan Comments:         Anesthesia Quick Evaluation

## 2019-02-03 NOTE — ED Notes (Signed)
Spoke with Dr. Nicanor Alcon about fluid administration and lack of IV access Order changed from LR to 0.9% NS due to medication compatiblility and lack of IV access due to IV drug abuse.

## 2019-02-03 NOTE — Progress Notes (Signed)
PT Cancellation Note  Patient Details Name: Matthew Carroll MRN: [id number] DOB: [birth year]   Cancelled Treatment:    Reason Eval/Treat Not Completed: Patient declined, no reason specified - Pt states he will not mobilize right now, wants PT to check back after I&D procedure. Will check back as schedule allows.  Nicola Police, PT Acute Rehabilitation Services Pager [phone]  Office [phone]    Tyrone Apple D Despina Hidden 02/03/2019, 11:17 AM

## 2019-02-03 NOTE — ED Provider Notes (Signed)
[facility]  SURGICAL Provider Note   CSN: [id number] Arrival date & time: 02/02/19  2246    History   Chief Complaint Chief Complaint  Patient presents with  . Abscess    HPI Matthew Carroll is a 33 y.o. male.     The history is provided by the patient.  Extremity Pain  This is a new problem. The current episode started more than 2 days ago. The problem occurs constantly. The problem has been gradually worsening. Pertinent negatives include no chest pain, no abdominal pain, no headaches and no shortness of breath. Nothing aggravates the symptoms. Nothing relieves the symptoms. He has tried nothing for the symptoms. The treatment provided no relief.  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  No anosmia.  No sore throat.  No SOB.    Past Medical History:  Diagnosis Date  . ADD (attention deficit disorder)   . Anxiety disorder   . Colitis   . Hepatitis   . Herpes simplex   . Hyperlipidemia   . Vitamin D deficiency     Patient Active Problem List   Diagnosis Date Noted  . Sepsis (HCC) 02/03/2019  . Cellulitis of right upper extremity   . 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 Alcohol abuse 01/29/2015  . ADD (attention deficit disorder)   . Hyperlipidemia   . Vitamin D deficiency   . Ingrown toenail 05/13/2011  . Anxiety 05/06/2011  . Diarrhea 08/01/2010  . Chronic abdominal pain 08/01/2010    Past Surgical History:  Procedure Laterality Date  . TENDON REPAIR     thumb        Home Medications    Prior to Admission medications   Medication Sig Start Date End Date Taking? Authorizing Provider  acyclovir (ZOVIRAX) 800 MG tablet Take 1 tablet (800 mg total) by mouth daily as needed (to keep in system). Patient states he just takes it PRN to keep it in his system Patient not  taking: Reported on 11/09/2017 12/27/14   Shirleen Schirmerllis, Courtney, PA-C  ALPRAZolam Prudy Feeler(XANAX) 0.5 MG tablet 1 pill as needed for panic attack daily 11/09/17   Quentin Mullingollier, Amanda, PA-C  amitriptyline (ELAVIL) 10 MG tablet 1 as needed up to twice a day for anxiety 11/09/17   Quentin Mullingollier, Amanda, PA-C  Aspirin-Acetaminophen-Caffeine (GOODYS EXTRA STRENGTH PO) Take 2 packets by mouth every 6 (six) hours as needed (pain).    [provider]  Cholecalciferol (VITAMIN D3) 2000 UNITS capsule Take 6,000 Units by mouth daily.    [provider]  ibuprofen (ADVIL,MOTRIN) 800 MG tablet Take 1 tablet (800 mg total) by mouth 3 (three) times daily. Patient not taking: Reported on 11/09/2017 01/13/17   Roxy HorsemanBrowning, Robert, PA-C  PARoxetine (PAXIL) 20 MG tablet Take 1 tablet (20 mg total) by mouth daily. 11/09/17 11/09/18  Quentin Mullingollier, Amanda, PA-C  traZODone (DESYREL) 50 MG tablet 1/2-1 tablet for sleep 11/09/17   Quentin Mullingollier, Amanda, PA-C    Family History Family History  Problem Relation Age of Onset  . Diabetes Father   . Colon cancer Neg Hx     Social History Social History   Tobacco Use  . Smoking status: Current Every Day Smoker  . Smokeless tobacco: Never Used  Substance Use Topics  . Alcohol use: Yes  . Drug use: Yes    Types: IV  Allergies   Patient has no known allergies.   Review of Systems Review of Systems  Constitutional: Positive for fever.  HENT: Negative for sore throat.   Respiratory: Negative for cough and shortness of breath.   Cardiovascular: Negative for chest pain.  Gastrointestinal: Negative for abdominal pain.  Musculoskeletal: Positive for joint swelling.  Skin: Positive for color change.  Neurological: Negative for headaches.  All other systems reviewed and are negative.    Physical Exam Updated Vital Signs BP 139/69 (BP Location: Left Arm)   Pulse 96   Temp (!) 102 F (38.9 C) (Oral)   Resp 16   Ht 5' 8.5" (1.74 m)   Wt 73.6 kg   SpO2 100%   BMI 24.31 kg/m    Physical Exam Vitals signs and nursing note reviewed.  Constitutional:      Appearance: He is normal weight. He is not diaphoretic.  HENT:     Head: Normocephalic and atraumatic.     Nose: Nose normal.  Eyes:     Conjunctiva/sclera: Conjunctivae normal.     Pupils: Pupils are equal, round, and reactive to light.  Neck:     Musculoskeletal: Normal range of motion and neck supple.  Cardiovascular:     Rate and Rhythm: Regular rhythm. Tachycardia present.     Pulses: Normal pulses.     Heart sounds: Normal heart sounds.  Pulmonary:     Effort: Pulmonary effort is normal. No respiratory distress.     Breath sounds: Normal breath sounds. No wheezing or rales.  Abdominal:     General: Abdomen is flat. Bowel sounds are normal.     Tenderness: There is no abdominal tenderness.  Musculoskeletal:     Right elbow: Normal.    Right wrist: He exhibits normal range of motion.     Left forearm: He exhibits tenderness and swelling.       Arms:  Skin:    General: Skin is warm and dry.     Capillary Refill: Capillary refill takes less than 2 seconds.     Findings: Erythema present.  Neurological:     General: No focal deficit present.     Mental Status: He is alert and oriented to person, place, and time.  Psychiatric:        Mood and Affect: Mood normal.        Behavior: Behavior normal.      ED Treatments / Results  Labs (all labs ordered are listed, but only abnormal results are displayed) Labs Reviewed  COMPREHENSIVE METABOLIC PANEL - Abnormal; Notable for the following components:      Result Value   Sodium 133 (*)    Chloride 97 (*)    Glucose, Bld 107 (*)    Calcium 8.3 (*)    Albumin 3.3 (*)    AST 45 (*)    ALT 67 (*)    All other components within normal limits  CBC WITH DIFFERENTIAL/PLATELET - Abnormal; Notable for the following components:   WBC 16.5 (*)    HCT 38.8 (*)    Neutro Abs 12.3 (*)    Monocytes Absolute 2.0 (*)    Abs Immature Granulocytes 0.09 (*)     All other components within normal limits  RAPID URINE DRUG SCREEN, HOSP PERFORMED - Abnormal; Notable for the following components:   Cocaine POSITIVE (*)    Benzodiazepines POSITIVE (*)    All other components within normal limits  CBC WITH DIFFERENTIAL/PLATELET - Abnormal; Notable for the following components:  WBC 17.9 (*)    HCT 38.2 (*)    Neutro Abs 12.5 (*)    Monocytes Absolute 2.3 (*)    Abs Immature Granulocytes 0.09 (*)    All other components within normal limits  SARS CORONAVIRUS 2 (HOSPITAL ORDER, PERFORMED IN [facility])  CULTURE, BLOOD (ROUTINE X 2)  CULTURE, BLOOD (ROUTINE X 2)  URINE CULTURE  SURGICAL PCR SCREEN  LACTIC ACID, PLASMA  URINALYSIS, ROUTINE W REFLEX MICROSCOPIC  PROTIME-INR  HEPATITIS PANEL, ACUTE  HIV ANTIBODY (ROUTINE TESTING W REFLEX)  COMPREHENSIVE METABOLIC PANEL    EKG EKG Interpretation  Date/Time:  Wednesday Noraa Pickeral 22 2020 23:46:46 EDT Ventricular Rate:  101 PR Interval:    QRS Duration: 101 QT Interval:  338 QTC Calculation: 439 R Axis:   123 Text Interpretation:  Sinus tachycardia Confirmed by Yanette Tripoli ([id number]) on 02/02/2019 11:54:35 PM   Radiology Dg Forearm Right  Result Date: 02/02/2019 CLINICAL DATA:  Infection, swelling, redness to forearm EXAM: RIGHT FOREARM - 2 VIEW COMPARISON:  None. FINDINGS: Soft tissue swelling over the proximal to mid forearm. No radiopaque foreign bodies. No underlying bone destruction to suggest osteomyelitis. No fracture, subluxation or dislocation. IMPRESSION: No acute bony abnormality. Electronically Signed   By: Charlett Nose M.D.   On: 02/02/2019 23:54    Procedures Procedures (including critical care time)  Medications Ordered in ED Medications  Tdap (BOOSTRIX) injection 0.5 mL (has no administration in time range)  polyethylene glycol (MIRALAX / GLYCOLAX) packet 17 g (has no administration in time range)  multivitamin with minerals tablet 1 tablet (has no  administration in time range)  vancomycin (VANCOCIN) IVPB 1000 mg/200 mL premix (has no administration in time range)  ceFEPIme (MAXIPIME) 2 g in sodium chloride 0.9 % 100 mL IVPB (has no administration in time range)  morphine 2 MG/ML injection 2 mg (2 mg Intravenous Given 02/03/19 0337)  ibuprofen (ADVIL) tablet 600 mg (600 mg Oral Given 02/03/19 0338)  acetaminophen (TYLENOL) tablet 650 mg (650 mg Oral Given 02/02/19 2321)  ceFEPIme (MAXIPIME) 2 g in sodium chloride 0.9 % 100 mL IVPB (0 g Intravenous Stopped 02/03/19 0040)  metroNIDAZOLE (FLAGYL) IVPB 500 mg (0 mg Intravenous Stopped 02/03/19 0209)  vancomycin (VANCOCIN) IVPB 1000 mg/200 mL premix (0 mg Intravenous Stopped 02/03/19 0209)  sodium chloride 0.9 % bolus 1,000 mL (0 mLs Intravenous Stopped 02/03/19 0209)  sodium chloride 0.9 % bolus 1,000 mL (0 mLs Intravenous Stopped 02/03/19 0209)  sodium chloride 0.9 % bolus 500 mL (500 mLs Intravenous Bolus from Bag 02/03/19 0323)    MDM Reviewed: nursing note and vitals Interpretation: labs, ECG and x-ray (elevated white count negative lactate) Total time providing critical care: 30-74 minutes (sepsis initiated ). This excludes time spent performing separately reportable procedures and services. Consults: admitting MD (Hand surgery Dr. Orlan Leavens please keep NPO for or)   CRITICAL CARE Performed by: Mkayla Steele K Zayquan Bogard-Rasch Total critical care time: 60 minutes Critical care time was exclusive of separately billable procedures and treating other patients. Critical care was necessary to treat or prevent imminent or life-threatening deterioration. Critical care was time spent personally by me on the following activities: development of treatment plan with patient and/or surrogate as well as nursing, discussions with consultants, evaluation of patient's response to treatment, examination of patient, obtaining history from patient or surrogate, ordering and performing treatments and interventions, ordering  and review of laboratory studies, ordering and review of radiographic studies, pulse oximetry and re-evaluation of patient's condition. Final Clinical Impressions(s) /  ED Diagnoses   Admit for cellulitis and wash out    Ermal Haberer, MD 02/03/19 2620

## 2019-02-03 NOTE — Plan of Care (Signed)
°  Problem: Education: °Goal: Knowledge of General Education information will improve °Description: Including pain rating scale, medication(s)/side effects and non-pharmacologic comfort measures °Outcome: Progressing °  °Problem: Clinical Measurements: °Goal: Will remain free from infection °Outcome: Progressing °  °Problem: Activity: °Goal: Risk for activity intolerance will decrease °Outcome: Progressing °  °Problem: Pain Managment: °Goal: General experience of comfort will improve °Outcome: Progressing °  °Problem: Skin Integrity: °Goal: Risk for impaired skin integrity will decrease °Outcome: Progressing °  °

## 2019-02-03 NOTE — Progress Notes (Addendum)
Upon skin assessment, patient stated left foot hurt. RN noticed painful blister on bottom of left foot. RN cleansed and applied pink foam. RN asked patient when this area started to hurt, patient "didn't know".  Pink foam was also applied to bottom of right foot, area is very red and painful to patient.   Tried to call Dr. Melvyn Novas, secretary staff stated that he and PA were in clinical and would pass on the message.

## 2019-02-03 NOTE — Anesthesia Procedure Notes (Signed)
Procedure Name: Intubation Date/Time: 02/03/2019 5:07 PM Performed by: Moshe Salisbury, CRNA Pre-anesthesia Checklist: Patient identified, Emergency Drugs available, Suction available and Patient being monitored Patient Re-evaluated:Patient Re-evaluated prior to induction Oxygen Delivery Method: Circle System Utilized Preoxygenation: Pre-oxygenation with 100% oxygen Induction Type: IV induction and Rapid sequence Laryngoscope Size: Mac and 3 Grade View: Grade II Tube type: Oral Tube size: 7.0 mm Number of attempts: 2 Airway Equipment and Method: Stylet Placement Confirmation: ETT inserted through vocal cords under direct vision,  positive ETCO2 and breath sounds checked- equal and bilateral Secured at: 22 cm Tube secured with: Tape Dental Injury: Teeth and Oropharynx as per pre-operative assessment

## 2019-02-03 NOTE — Progress Notes (Signed)
OT Cancellation Note  Patient Details Name: Matthew Carroll MRN: [id number] DOB: 1986-03-21   Cancelled Treatment:    Reason Eval/Treat Not Completed: Patient at procedure or test/ unavailable. Pt off the unit.  Will attempt OT evaluation tomorrow 4/24.    Cipriano Mile OTR/L Acute Rehab Services [phone] 02/03/2019, 2:26 PM

## 2019-02-03 NOTE — Op Note (Signed)
PREOPERATIVE DIAGNOSIS: Right forearm deep abscess dorsal and volar aspects of the forearm  POSTOPERATIVE DIAGNOSIS: Same  ATTENDING SURGEON: Dr. Bradly Bienenstock who is scrubbed and present for the entire procedure  ASSISTANT SURGEON: None  ANESTHESIA: General  OPERATIVE PROCEDURE: #1: Drainage of right forearm deep abscess volar forearm #2: Drainage of right forearm deep abscess dorsal forearm  IMPLANTS: None  RADIOGRAPHIC INTERPRETATION: None  SURGICAL INDICATIONS: Patient is a right-hand-dominant gentleman who presented to the emergency department with a worsening infection.  Patient does have a history of IV drug use.  Patient was seen and evaluated and recommended undergo the above procedure.  Risk benefits and alternatives were discussed in detail with the patient and a signed informed consent was obtained.  SURGICAL TECHNIQUE: Patient was palpated find the preoperative holding area to mark the permanent marker made on the right forearm to indicate the correct operative site.  Patient then brought back the operating placed supine on the anesthesia table where the general anesthetic was administered.  Preoperative antibiotics have been given.  The patient was on cefepime.  Right upper extremity was prepped and draped after tourniquet had been placed on the right brachium.  Sterile draping occurred.  A timeout was called the correct site was identified the procedure then begun.  A longitudinal incision made directly over the dorsal aspect of the ulna.  The limb was elevated tourniquet insufflated gross purulence was encountered.  Almost 50 to 75cc cc of purulent drainage was expressed out of the forearm. Excisional debridement was then carried out of the devitalized tissue surrounding with a sharp knife as well as a ronguer.  After debridement was undertaken both drainage of both forearm compartments both the volar and dorsal compartments these were 2 separate procedures 2 different  interventions for the forearm both dorsal and volar of a complicated abscess.  The wound was thoroughly irrigated.  The muscle and fascia tissue look pretty good.  3 L of irrigation run through the area.  The wound was then loosely reapproximated with nylon suture.  Xeroform dressing and a sterile compressive bandage then applied.  The patient tolerated the procedure well.  Patient was then placed in a long-arm posterior splint extubated taken recovery in good condition.   POSTOPERATIVE PLAN: Patient be admitted back to the internal medicine service.  Plan for repeat I&D on Saturday the case has been posted already.  Continue the IV antibiotics follow the wound cultures.  N.p.o. at midnight for Saturday surgery.

## 2019-02-03 NOTE — Social Work (Signed)
Aware pt is homeless and has hx of substance use. Should pt need long term abx he will not be safe discharge and must remain in house. Will meet with pt to discuss resources.  Octavio Graves, MSW, [facility] Clinical Social Work [phone]

## 2019-02-03 NOTE — Progress Notes (Signed)
Pharmacy Antibiotic Note  BEJAMIN GREENSLADE is a 33 y.o. male admitted on 02/02/2019 with wound infection/cellulitis.  Pharmacy has been consulted for Vancomycin/Cefepime dosing. WBC elevated. Renal function ok.   Plan: Vancomycin 1000 mg IV q8h >>Estimated AUC: 530 Cefepime 2g IV q8h Trend WBC, temp, renal function  F/U infectious work-up Drug levels as indicated  Temp (24hrs), Avg:103 F (39.4 C), Min:103 F (39.4 C), Max:103 F (39.4 C)  Recent Labs  Lab 02/02/19 2310  WBC 16.5*  CREATININE 0.97  LATICACIDVEN 1.2    CrCl cannot be calculated (Unknown ideal weight.).    No Known Allergies   Abran Duke 02/03/2019 2:22 AM

## 2019-02-03 NOTE — Transfer of Care (Signed)
Immediate Anesthesia Transfer of Care Note  Patient: Matthew Carroll  Procedure(s) Performed: IRRIGATION AND DEBRIDEMENT FOREARM (Right )  Patient Location: PACU  Anesthesia Type:General  Level of Consciousness: awake and patient cooperative  Airway & Oxygen Therapy: Patient Spontanous Breathing and Patient connected to nasal cannula oxygen  Post-op Assessment: Report given to RN, Post -op Vital signs reviewed and stable and Patient moving all extremities  Post vital signs: Reviewed and stable  Last Vitals:  Vitals Value Taken Time  BP 134/74 02/03/2019  5:53 PM  Temp    Pulse 100 02/03/2019  5:55 PM  Resp 31 02/03/2019  5:55 PM  SpO2 97 % 02/03/2019  5:55 PM  Vitals shown include unvalidated device data.  Last Pain:  Vitals:   02/03/19 1750  TempSrc:   PainSc: (P) 10-Worst pain ever      Patients Stated Pain Goal: 2 (02/03/19 1350)  Complications: No apparent anesthesia complications

## 2019-02-03 NOTE — ED Notes (Signed)
ED TO INPATIENT HANDOFF REPORT  ED Nurse Name and Phone #: Lorin Picketscott, [phone]  S Name/Age/Gender Matthew Carroll 33 y.o. male Room/Bed: 022C/022C  Code Status   Code Status: Prior  Home/SNF/Other Home Patient oriented to: self, place, time and situation Is this baseline? Yes   Triage Complete: Triage complete  Chief Complaint Bug bite R arm  Triage Note Pt has large abscess to R posterior forearm with necrotic tissue.  Extensive swelling and redness to forearm down to hand.  Pt reports he believes he was bit by a spider 3-4 days ago.  Questioned IV drug use and pt states he has used IV drugs but doesn't think that is what this is from.   Allergies No Known Allergies  Level of Care/Admitting Diagnosis ED Disposition    ED Disposition Condition Comment   Admit  [facility] [[id number]]  Level of Care: Med-Surg [16]  Covid Evaluation: Person Under Investigation (PUI)  Isolation Risk Level: Low Risk  (Less than 4L Gray supplementation)  Diagnosis: Sepsis [facility](HCC) [[id number]]) [[id number]]  Admitting Physician: Perley JainMCDIARMID, TODD D Friso.Males[[id number]]  Attending Physician: MCDIARMID, TODD D [[id number]]  PT Class (Do Not Modify): Observation [104]  PT Acc Code (Do Not Modify): Observation [10022]       B Medical/Surgery History Past Medical History:  Diagnosis Date  . ADD (attention deficit disorder)   . Anxiety disorder   . Colitis   . Hepatitis   . Herpes simplex   . Hyperlipidemia   . Vitamin D deficiency    Past Surgical History:  Procedure Laterality Date  . TENDON REPAIR     thumb     A IV Location/Drains/Wounds Patient Lines/Drains/Airways Status   Active Line/Drains/Airways    Name:   Placement date:   Placement time:   Site:   Days:   Peripheral IV 02/02/19 Left;Upper Arm   02/02/19    2347    Arm   1   Peripheral IV 02/03/19 Anterior;Left Forearm   02/03/19    0019    Forearm   less than 1          Intake/Output Last 24 hours No intake or output data in the  24 hours ending 02/03/19 0232  Labs/Imaging Results for orders placed or performed during the hospital encounter of 02/02/19 (from the past 48 hour(s))  Lactic acid, plasma     Status: None   Collection Time: 02/02/19 11:10 PM  Result Value Ref Range   Lactic Acid, Venous 1.2 0.5 - 1.9 mmol/L    Comment: Performed at [facility], [address] [place], [place] [address]  Comprehensive metabolic panel     Status: Abnormal   Collection Time: 02/02/19 11:10 PM  Result Value Ref Range   Sodium 133 (L) 135 - 145 mmol/L   Potassium 3.8 3.5 - 5.1 mmol/L   Chloride 97 (L) 98 - 111 mmol/L   CO2 23 22 - 32 mmol/L   Glucose, Bld 107 (H) 70 - 99 mg/dL   BUN 12 6 - 20 mg/dL   Creatinine, Ser [id number] 0.61 - 1.24 mg/dL   Calcium 8.3 (L) 8.9 - 10.3 mg/dL   Total Protein 6.9 6.5 - 8.1 g/dL   Albumin 3.3 (L) 3.5 - 5.0 g/dL   AST 45 (H) 15 - 41 U/L   ALT 67 (H) 0 - 44 U/L   Alkaline Phosphatase 46 38 - 126 U/L   Total Bilirubin 1.2 0.3 - 1.2 mg/dL   GFR  calc non Af Amer >60 >60 mL/min   GFR calc Af Amer >60 >60 mL/min   Anion gap 13 5 - 15    Comment: Performed at [facility], [address], [place], [place] [address]  CBC with Differential     Status: Abnormal   Collection Time: 02/02/19 11:10 PM  Result Value Ref Range   WBC 16.5 (H) 4.0 - 10.5 K/uL   RBC 4.26 4.22 - 5.81 MIL/uL   Hemoglobin 13.1 13.0 - 17.0 g/dL   HCT 94.4 (L) 96.7 - 59.1 %   MCV 91.1 80.0 - 100.0 fL   MCH 30.8 26.0 - 34.0 pg   MCHC 33.8 30.0 - 36.0 g/dL   RDW 63.8 46.6 - 59.9 %   Platelets 236 150 - 400 K/uL   nRBC 0.0 0.0 - 0.2 %   Neutrophils Relative % 74 %   Neutro Abs 12.3 (H) 1.7 - 7.7 K/uL   Lymphocytes Relative 13 %   Lymphs Abs 2.1 0.7 - 4.0 K/uL   Monocytes Relative 12 %   Monocytes Absolute 2.0 (H) 0.1 - 1.0 K/uL   Eosinophils Relative 0 %   Eosinophils Absolute 0.0 0.0 - 0.5 K/uL   Basophils Relative 0 %   Basophils Absolute 0.0 0.0 - 0.1 K/uL   Immature Granulocytes 1 %   Abs Immature  Granulocytes 0.09 (H) 0.00 - 0.07 K/uL    Comment: Performed at [facility], [address] [place], [place] [address]  SARS Coronavirus 2 [facility] order, Performed in [facility] hospital lab)     Status: None   Collection Time: 02/03/19  1:04 AM  Result Value Ref Range   SARS Coronavirus 2 NEGATIVE NEGATIVE    Comment: (NOTE) If result is NEGATIVE SARS-CoV-2 target nucleic acids are NOT DETECTED. The SARS-CoV-2 RNA is generally detectable in upper and lower  respiratory specimens during the acute phase of infection. The lowest  concentration of SARS-CoV-2 viral copies this assay can detect is 250  copies / mL. A negative result does not preclude SARS-CoV-2 infection  and should not be used as the sole basis for treatment or other  patient management decisions.  A negative result may occur with  improper specimen collection / handling, submission of specimen other  than nasopharyngeal swab, presence of viral mutation(s) within the  areas targeted by this assay, and inadequate number of viral copies  (<250 copies / mL). A negative result must be combined with clinical  observations, patient history, and epidemiological information. If result is POSITIVE SARS-CoV-2 target nucleic acids are DETECTED. The SARS-CoV-2 RNA is generally detectable in upper and lower  respiratory specimens dur ing the acute phase of infection.  Positive  results are indicative of active infection with SARS-CoV-2.  Clinical  correlation with patient history and other diagnostic information is  necessary to determine patient infection status.  Positive results do  not rule out bacterial infection or co-infection with other viruses. If result is PRESUMPTIVE POSTIVE SARS-CoV-2 nucleic acids MAY BE PRESENT.   A presumptive positive result was obtained on the submitted specimen  and confirmed on repeat testing.  While 2019 novel coronavirus  (SARS-CoV-2) nucleic acids may be present in the submitted  sample  additional confirmatory testing may be necessary for epidemiological  and / or clinical management purposes  to differentiate between  SARS-CoV-2 and other Sarbecovirus currently known to infect humans.  If clinically indicated additional testing with an alternate test  methodology [phone]) is advised.  The SARS-CoV-2 RNA is generally  detectable in upper and lower respiratory sp ecimens during the acute  phase of infection. The expected result is Negative. Fact Sheet for Patients:  BoilerBrush.com.cy Fact Sheet for Healthcare Providers: https://pope.com/ This test is not yet approved or cleared by the [place] FDA and has been authorized for detection and/or diagnosis of SARS-CoV-2 by FDA under an Emergency Use Authorization (EUA).  This EUA will remain in effect (meaning this test can be used) for the duration of the COVID-19 declaration under Section 564(b)(1) of the Act, 21 U.S.C. section 360bbb-3(b)(1), unless the authorization is terminated or revoked sooner. Performed at [facility], [address] [place], [place] [address]   Urinalysis, Routine w reflex microscopic     Status: None   Collection Time: 02/03/19  1:34 AM  Result Value Ref Range   Color, Urine YELLOW YELLOW   APPearance CLEAR CLEAR   Specific Gravity, Urine 1.006 1.005 - 1.030   pH 7.0 5.0 - 8.0   Glucose, UA NEGATIVE NEGATIVE mg/dL   Hgb urine dipstick NEGATIVE NEGATIVE   Bilirubin Urine NEGATIVE NEGATIVE   Ketones, ur NEGATIVE NEGATIVE mg/dL   Protein, ur NEGATIVE NEGATIVE mg/dL   Nitrite NEGATIVE NEGATIVE   Leukocytes,Ua NEGATIVE NEGATIVE    Comment: Performed at [facility], [address] [place], [place] [address]  Rapid urine drug screen (hospital performed)     Status: Abnormal   Collection Time: 02/03/19  1:34 AM  Result Value Ref Range   Opiates NONE DETECTED NONE DETECTED   Cocaine POSITIVE (A) NONE DETECTED    Benzodiazepines POSITIVE (A) NONE DETECTED   Amphetamines NONE DETECTED NONE DETECTED   Tetrahydrocannabinol NONE DETECTED NONE DETECTED   Barbiturates NONE DETECTED NONE DETECTED    Comment: (NOTE) DRUG SCREEN FOR MEDICAL PURPOSES ONLY.  IF CONFIRMATION IS NEEDED FOR ANY PURPOSE, NOTIFY LAB WITHIN 5 DAYS. LOWEST DETECTABLE LIMITS FOR URINE DRUG SCREEN Drug Class                     Cutoff (ng/mL) Amphetamine and metabolites    1000 Barbiturate and metabolites    200 Benzodiazepine                 200 Tricyclics and metabolites     300 Opiates and metabolites        300 Cocaine and metabolites        300 THC                            50 Performed at [facility], [address], [place], [place] [address]    Dg Forearm Right  Result Date: 02/02/2019 CLINICAL DATA:  Infection, swelling, redness to forearm EXAM: RIGHT FOREARM - 2 VIEW COMPARISON:  None. FINDINGS: Soft tissue swelling over the proximal to mid forearm. No radiopaque foreign bodies. No underlying bone destruction to suggest osteomyelitis. No fracture, subluxation or dislocation. IMPRESSION: No acute bony abnormality. Electronically Signed   By: Charlett Nose M.D.   On: 02/02/2019 23:54    Pending Labs Unresulted Labs (From admission, onward)    Start     Ordered   02/03/19 0227  Hepatitis panel, acute  Once,   R     02/03/19 0226   02/02/19 2316  Blood Culture (routine x 2)  BLOOD CULTURE X 2,   STAT     02/02/19 2317   02/02/19 2301  Culture, blood (Routine x 2)  BLOOD CULTURE X 2,   STAT     02/02/19 2300   Signed and Held  HIV antibody  Once,   R    Comments:  HIV screening recommended for all admitted patients    Signed and Held   Signed and Held  HIV antibody (Routine Testing)  Once,   R     Signed and Held   Signed and Held  Urine culture  ONCE - STAT,   STAT     Signed and Held   Signed and Held  CBC with Differential/Platelet  Tomorrow morning,   R     Signed and Held   Signed and Held   Comprehensive metabolic panel  Tomorrow morning,   R     Signed and Held   Signed and Held  Protime-INR  Tomorrow morning,   R     Signed and Held          Vitals/Pain Today's Vitals   02/03/19 0145 02/03/19 0200 02/03/19 0215 02/03/19 0230  BP: 130/67 137/70 124/61 118/64  Pulse: 88 92 99 92  Resp: Temp:      TempSrc:      SpO2: 94% 95% 96% 93%  PainSc:        Isolation Precautions No active isolations  Medications Medications  sodium chloride 0.9 % bolus 500 mL (0 mLs Intravenous Stopping Infusion hung by another clincian 02/03/19 0055)  Tdap (BOOSTRIX) injection 0.5 mL (has no administration in time range)  vancomycin (VANCOCIN) IVPB 1000 mg/200 mL premix (has no administration in time range)  ceFEPIme (MAXIPIME) 2 g in sodium chloride 0.9 % 100 mL IVPB (has no administration in time range)  acetaminophen (TYLENOL) tablet 650 mg (650 mg Oral Given 02/02/19 2321)  ceFEPIme (MAXIPIME) 2 g in sodium chloride 0.9 % 100 mL IVPB (0 g Intravenous Stopped 02/03/19 0040)  metroNIDAZOLE (FLAGYL) IVPB 500 mg (0 mg Intravenous Stopped 02/03/19 0209)  vancomycin (VANCOCIN) IVPB 1000 mg/200 mL premix (0 mg Intravenous Stopped 02/03/19 0209)  sodium chloride 0.9 % bolus 1,000 mL (0 mLs Intravenous Stopped 02/03/19 0209)  sodium chloride 0.9 % bolus 1,000 mL (0 mLs Intravenous Stopped 02/03/19 0209)    Mobility walks Low fall risk   Focused Assessments    R Recommendations: See Admitting Provider Note  Report given to:   Additional Notes:

## 2019-02-03 NOTE — H&P (Addendum)
Family Medicine Teaching [facility] Admission History and Physical Service Pager: [phone]  Patient name: Matthew Carroll Medical record number: [id number] Date of birth: Feb 11, 1986 Age: 33 y.o. Gender: male  Primary Care Provider: Lucky Cowboy, MD Consultants: Hand surgery  Code Status: Full Preferred Emergency Contact: None, patient states both parents are deceased, children are young, no siblings   Chief Complaint: hand swelling   Assessment and Plan: Matthew Carroll is a 33 y.o. male presenting with right forearm cellultitis . PMH is significant for anxiety   Sepsis 2/2 Cellulitis Patient with worsening hand edema and erythema x4 days. On admission code sepsis was called with vital signs significant for fever to 103, tachypnea to 24, and tachycardia to 102. BP stable at 123/72. Likely 2/2 IV drug use given recent injection sight in RUE 1 week ago. COVID test pending, ordered given fever and homelessness, however low likelihood given that fever is explained by cellulitis and otherwise no cough or SOB. IV vanc/cefepime started in ED. Hand surgery consulted in ED who recommend debridement/irrigation of RUE. Plan for OR today, keep NPO. Patient otherwise well appearing and oriented x3. LA wnl at 1.2. Leukocytosis to 16.5. Unclear whether pustular drainage but given severity of disease on presentation will treat with broad spectrum antibiotics to cover for possible MRSA exposure.  -admit to med surg, attending Dr. McDiarmid -vital per routine -continue vanc (4/23-) and cefepime (4/23-) -RN to draw borders around erythema  -NPO -hand surgery consulted, appreciate recommendations  -Tdap -ibuprofen for pain control, will add pain regimen per hand surgery recommendations after surgery  -am labs: BMP, CBC with diff -neurovascular checks q4h -PT/OT  Hyponatremia  Na 133. Possibly 2/2 poor PO intake. S/p 2.5L NS bolus in ED.  -monitor on AM CMP -will not start MIVF at this time to avoid  overcorrection as patient is s/p NS boluses in ED and will likely get more fluids periop   Mild transaminitis AST and ALT mildly elevated to 45 and 67, respectively. Favoring viral etiology. Patient with h/o IVDU.  -hepatitis panel  -monitor on AM CMP -PT/INR  Anxiety   Patient reports having some claustrophobia, regarding mask use in hospital. Home meds listed as paxil, trazodone, and xanax. Patient reports not using any medications at home.  [place] database reviewed, last alprazolam RX filled on 11/09/17.  -holding home meds  -monitor closely   Polysubstance use Reported heroin use, last use 1 week ago. UDS positive for benzodiazepines and cocaine.  -monitor for symptoms of withdrawal  -encourage cessation   Tobacco abuse Reports 1/2 ppd cigarette use.  -can consider nicotine patch   FEN/GI: NPO pending surgery  Prophylaxis: SCDs, holding pharmacologic in preparation for surgery   Disposition: admit for surgical evaluation and IV abx, attending Dr. McDiarmid   History of Present Illness:  Matthew Carroll is a 33 y.o. male presenting with right upper extremity swelling.   Patient reports that symptoms began 4 days ago.  Symptoms were started as a small pimple/boil but have been persistently worsening.  States that his arm has getting more swollen, with more color change and pain.  Did notice area opening up on its own.  Symptoms became so bad today that he could not take it anymore and had to come in to the emergency department.  States that he felt like he was unable to use his hand as well.  States that he does not think he had a fever but did feel back today.  States that he  just does not have that much energy anymore.  Denies any shortness of breath or chest pain.  States that he has had a chronic cough for years now.  No urinary symptoms.  Patient is homeless.  Has history of IV drug use, heroin.  Last use was 1 week ago with injection site in right upper extremity.  In ED patient  meeting sepsis criteria requiring 2.5L NS fluid bolus. Broad spectrum abx with vanc/cefepime started. Hand surgery was consulted who recommended patient to stay NPO for surgery today.   Review Of Systems: Per HPI with the following additions:   Review of Systems  Constitutional: Negative for chills and fever.  Respiratory: Positive for cough. Negative for shortness of breath.   Cardiovascular: Negative for chest pain.  Genitourinary: Negative for dysuria, frequency and urgency.  Musculoskeletal: Positive for joint pain.    Patient Active Problem List   Diagnosis Date Noted  . Sepsis (HCC) 02/03/2019  . 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 Alcohol abuse 01/29/2015  . ADD (attention deficit disorder)   . Hyperlipidemia   . Vitamin D deficiency   . Ingrown toenail 05/13/2011  . Anxiety 05/06/2011  . Diarrhea 08/01/2010  . Chronic abdominal pain 08/01/2010    Past Medical History: Past Medical History:  Diagnosis Date  . ADD (attention deficit disorder)   . Anxiety disorder   . Colitis   . Hepatitis   . Herpes simplex   . Hyperlipidemia   . Vitamin D deficiency     Past Surgical History: Past Surgical History:  Procedure Laterality Date  . TENDON REPAIR     thumb    Social History: Social History   Tobacco Use  . Smoking status: Current Every Day Smoker  . Smokeless tobacco: Never Used  Substance Use Topics  . Alcohol use: Yes  . Drug use: Yes    Types: IV   Additional social history: homeless, IV drug use (heroin), denies alcohol use, 1/2 ppd tobacco use   Please also refer to relevant sections of EMR.  Family History: Family History  Problem Relation Age of Onset  . Diabetes Father   . Colon cancer Neg Hx     Allergies and Medications: No Known Allergies No current facility-administered medications on file prior to encounter.    Current Outpatient Medications on File Prior to Encounter  Medication Sig  Dispense Refill  . acyclovir (ZOVIRAX) 800 MG tablet Take 1 tablet (800 mg total) by mouth daily as needed (to keep in system). Patient states he just takes it PRN to keep it in his system (Patient not taking: Reported on 11/09/2017) 90 tablet 0  . ALPRAZolam (XANAX) 0.5 MG tablet 1 pill as needed for panic attack daily 10 tablet 0  . amitriptyline (ELAVIL) 10 MG tablet 1 as needed up to twice a day for anxiety 30 tablet 0  . Aspirin-Acetaminophen-Caffeine (GOODYS EXTRA STRENGTH PO) Take 2 packets by mouth every 6 (six) hours as needed (pain).    . Cholecalciferol (VITAMIN D3) 2000 UNITS capsule Take 6,000 Units by mouth daily.    Marland Kitchen. ibuprofen (ADVIL,MOTRIN) 800 MG tablet Take 1 tablet (800 mg total) by mouth 3 (three) times daily. (Patient not taking: Reported on 11/09/2017) 21 tablet 0  . PARoxetine (PAXIL) 20 MG tablet Take 1 tablet (20 mg total) by mouth daily. 30 tablet 2  . traZODone (DESYREL) 50 MG tablet 1/2-1 tablet for sleep 30 tablet 2    Objective: BP 118/64  Pulse 92   Temp (!) 101.1 F (38.4 C) (Oral)   Resp 20   SpO2 93%  Exam: General: awake and alert, sitting up in bed, NAD Eyes: EOMI, no scleral icterus  ENTM: moist mucous membranes, poor dentition  Neck: supple Cardiovascular: RRR, no MRG, 2+ radial pulses bilaterally  Respiratory: CTAB, no wheezes, rales, or rhonchi  Gastrointestinal: soft, non tender, non distended, bowel sounds normal  MSK: right forearm with significant edema and erythema. Quarter sized black circle at center. Unable to take picture as patient is airborne/contact precaution, somewhat limited hand flexion/extension. Normal extension/flexion at elbow. Diminished grip strength in right hand  Derm: erythema around right forearm  Neuro: sensation intact, no focal deficit  Psych: normal affect   Labs and Imaging: CBC BMET  Recent Labs  Lab 02/02/19 2310  WBC 16.5*  HGB 13.1  HCT 38.8*  PLT 236   Recent Labs  Lab 02/02/19 2310  NA 133*  K 3.8   CL 97*  CO2 23  BUN 12  CREATININE 0.97  GLUCOSE 107*  CALCIUM 8.3*      Ref. Range 02/03/2019 01:34  Appearance Latest Ref Range: CLEAR  CLEAR  Bilirubin Urine Latest Ref Range: NEGATIVE  NEGATIVE  Color, Urine Latest Ref Range: YELLOW  YELLOW  Glucose, UA Latest Ref Range: NEGATIVE mg/dL NEGATIVE  Hgb urine dipstick Latest Ref Range: NEGATIVE  NEGATIVE  Ketones, ur Latest Ref Range: NEGATIVE mg/dL NEGATIVE  Nitrite Latest Ref Range: NEGATIVE  NEGATIVE  pH Latest Ref Range: 5.0 - 8.0  7.0  Protein Latest Ref Range: NEGATIVE mg/dL NEGATIVE  Specific Gravity, Urine Latest Ref Range: 1.005 - 1.030  1.006    Ref. Range 02/02/2019 23:10  Lactic Acid, Venous Latest Ref Range: 0.5 - 1.9 mmol/L 1.2    Ref. Range 02/03/2019 01:34  Amphetamines Latest Ref Range: NONE DETECTED  NONE DETECTED  Barbiturates Latest Ref Range: NONE DETECTED  NONE DETECTED  Benzodiazepines Latest Ref Range: NONE DETECTED  POSITIVE (A)  Opiates Latest Ref Range: NONE DETECTED  NONE DETECTED  COCAINE Latest Ref Range: NONE DETECTED  POSITIVE (A)  Tetrahydrocannabinol Latest Ref Range: NONE DETECTED  NONE DETECTED   Dg Forearm Right  Result Date: 02/02/2019 CLINICAL DATA:  Infection, swelling, redness to forearm EXAM: RIGHT FOREARM - 2 VIEW COMPARISON:  None. FINDINGS: Soft tissue swelling over the proximal to mid forearm. No radiopaque foreign bodies. No underlying bone destruction to suggest osteomyelitis. No fracture, subluxation or dislocation. IMPRESSION: No acute bony abnormality. Electronically Signed   By: Charlett Nose M.D.   On: 02/02/2019 23:54    Oralia Manis, DO 02/03/2019, 2:43 AM PGY-2, [facility] FPTS Intern pager: [phone], text pages welcome

## 2019-02-03 NOTE — Social Work (Signed)
Acknowledging additional consult, unable to place pt's in substance use rehab from hospital. Will meet with pt with resources. Again, pt cannot leave hospital with picc line - should he need extended abx, we are unable to place in SNF due to substance use hx.   Octavio Graves, MSW, [facility] Clinical Social Work [phone]

## 2019-02-03 NOTE — Consult Note (Signed)
Reason for Consult:Right forearm mass Referring Physician: Inpatient medicine  Matthew Carroll is an 33 y.o. male.  HPI: Pt with worsening worsening pain and swelling in the right forearm.  Patient was injecting drugs in his right arm 9 days ago.  The patient is homeless.  The patient denies previous abscess or infections to the right upper extremity.  Past Medical History:  Diagnosis Date  . ADD (attention deficit disorder)   . Anxiety disorder   . Cellulitis 01/2019   RIGHT UPPER EXTREMITY  . Colitis   . Hepatitis   . Herpes simplex   . Hyperlipidemia   . Vitamin D deficiency     Past Surgical History:  Procedure Laterality Date  . TENDON REPAIR     thumb    Family History  Problem Relation Age of Onset  . Diabetes Father   . Colon cancer Neg Hx     Social History:  reports that he has been smoking cigarettes. He has a 4.00 pack-year smoking history. He has never used smokeless tobacco. He reports current alcohol use. He reports current drug use. Drugs: IV and Cocaine.  Allergies: No Known Allergies  Medications: I have reviewed the patient's current medications.  Results for orders placed or performed during the hospital encounter of 02/02/19 (from the past 48 hour(s))  Lactic acid, plasma     Status: None   Collection Time: 02/02/19 11:10 PM  Result Value Ref Range   Lactic Acid, Venous 1.2 0.5 - 1.9 mmol/L    Comment: Performed at [facility], [address], [place], [place] [address]  Comprehensive metabolic panel     Status: Abnormal   Collection Time: 02/02/19 11:10 PM  Result Value Ref Range   Sodium 133 (L) 135 - 145 mmol/L   Potassium 3.8 3.5 - 5.1 mmol/L   Chloride 97 (L) 98 - 111 mmol/L   CO2 23 22 - 32 mmol/L   Glucose, Bld 107 (H) 70 - 99 mg/dL   BUN 12 6 - 20 mg/dL   Creatinine, Ser 6.96 0.61 - 1.24 mg/dL   Calcium 8.3 (L) 8.9 - 10.3 mg/dL   Total Protein 6.9 6.5 - 8.1 g/dL   Albumin 3.3 (L) 3.5 - 5.0 g/dL   AST 45 (H) 15 - 41 U/L   ALT 67  (H) 0 - 44 U/L   Alkaline Phosphatase 46 38 - 126 U/L   Total Bilirubin 1.2 0.3 - 1.2 mg/dL   GFR calc non Af Amer >60 >60 mL/min   GFR calc Af Amer >60 >60 mL/min   Anion gap 13 5 - 15    Comment: Performed at [facility], [address] [place], [place] [address]  CBC with Differential     Status: Abnormal   Collection Time: 02/02/19 11:10 PM  Result Value Ref Range   WBC 16.5 (H) 4.0 - 10.5 K/uL   RBC 4.26 4.22 - 5.81 MIL/uL   Hemoglobin 13.1 13.0 - 17.0 g/dL   HCT 41.3 (L) 24.4 - 01.0 %   MCV 91.1 80.0 - 100.0 fL   MCH 30.8 26.0 - 34.0 pg   MCHC 33.8 30.0 - 36.0 g/dL   RDW 27.2 53.6 - 64.4 %   Platelets 236 150 - 400 K/uL   nRBC 0.0 0.0 - 0.2 %   Neutrophils Relative % 74 %   Neutro Abs 12.3 (H) 1.7 - 7.7 K/uL   Lymphocytes Relative 13 %   Lymphs Abs 2.1 0.7 -  4.0 K/uL   Monocytes Relative 12 %   Monocytes Absolute 2.0 (H) 0.1 - 1.0 K/uL   Eosinophils Relative 0 %   Eosinophils Absolute 0.0 0.0 - 0.5 K/uL   Basophils Relative 0 %   Basophils Absolute 0.0 0.0 - 0.1 K/uL   Immature Granulocytes 1 %   Abs Immature Granulocytes 0.09 (H) 0.00 - 0.07 K/uL    Comment: Performed at [facility], [address] [place], [place] [address]  Blood Culture (routine x 2)     Status: None (Preliminary result)   Collection Time: 02/02/19 11:19 PM  Result Value Ref Range   Specimen Description BLOOD LEFT ARM    Special Requests      BOTTLES DRAWN AEROBIC AND ANAEROBIC Blood Culture results may not be optimal due to an inadequate volume of blood received in culture bottles   Culture      NO GROWTH < 12 HOURS Performed at [facility], [address] [place], [place] [address]    Report Status PENDING   Culture, blood (Routine x 2)     Status: None (Preliminary result)   Collection Time: 02/02/19 11:29 PM  Result Value Ref Range   Specimen Description BLOOD RIGHT UPPER ARM    Special Requests      BOTTLES DRAWN AEROBIC AND ANAEROBIC Blood Culture adequate  volume   Culture      NO GROWTH < 12 HOURS Performed at [facility], [address] [place], [place] [address]    Report Status PENDING   SARS Coronavirus 2 [facility] order, Performed in [facility] hospital lab)     Status: None   Collection Time: 02/03/19  1:04 AM  Result Value Ref Range   SARS Coronavirus 2 NEGATIVE NEGATIVE    Comment: (NOTE) If result is NEGATIVE SARS-CoV-2 target nucleic acids are NOT DETECTED. The SARS-CoV-2 RNA is generally detectable in upper and lower  respiratory specimens during the acute phase of infection. The lowest  concentration of SARS-CoV-2 viral copies this assay can detect is 250  copies / mL. A negative result does not preclude SARS-CoV-2 infection  and should not be used as the sole basis for treatment or other  patient management decisions.  A negative result may occur with  improper specimen collection / handling, submission of specimen other  than nasopharyngeal swab, presence of viral mutation(s) within the  areas targeted by this assay, and inadequate number of viral copies  (<250 copies / mL). A negative result must be combined with clinical  observations, patient history, and epidemiological information. If result is POSITIVE SARS-CoV-2 target nucleic acids are DETECTED. The SARS-CoV-2 RNA is generally detectable in upper and lower  respiratory specimens dur ing the acute phase of infection.  Positive  results are indicative of active infection with SARS-CoV-2.  Clinical  correlation with patient history and other diagnostic information is  necessary to determine patient infection status.  Positive results do  not rule out bacterial infection or co-infection with other viruses. If result is PRESUMPTIVE POSTIVE SARS-CoV-2 nucleic acids MAY BE PRESENT.   A presumptive positive result was obtained on the submitted specimen  and confirmed on repeat testing.  While 2019 novel coronavirus  (SARS-CoV-2) nucleic acids may be present  in the submitted sample  additional confirmatory testing may be necessary for epidemiological  and / or clinical management purposes  to differentiate between  SARS-CoV-2 and other Sarbecovirus currently known to infect humans.  If clinically indicated additional  testing with an alternate test  methodology [phone]) is advised. The SARS-CoV-2 RNA is generally  detectable in upper and lower respiratory sp ecimens during the acute  phase of infection. The expected result is Negative. Fact Sheet for Patients:  BoilerBrush.com.cy Fact Sheet for Healthcare Providers: https://pope.com/ This test is not yet approved or cleared by the [place] FDA and has been authorized for detection and/or diagnosis of SARS-CoV-2 by FDA under an Emergency Use Authorization (EUA).  This EUA will remain in effect (meaning this test can be used) for the duration of the COVID-19 declaration under Section 564(b)(1) of the Act, 21 U.S.C. section 360bbb-3(b)(1), unless the authorization is terminated or revoked sooner. Performed at [facility], [address], [place], [place] [address]   Urinalysis, Routine w reflex microscopic     Status: None   Collection Time: 02/03/19  1:34 AM  Result Value Ref Range   Color, Urine YELLOW YELLOW   APPearance CLEAR CLEAR   Specific Gravity, Urine 1.006 1.005 - 1.030   pH 7.0 5.0 - 8.0   Glucose, UA NEGATIVE NEGATIVE mg/dL   Hgb urine dipstick NEGATIVE NEGATIVE   Bilirubin Urine NEGATIVE NEGATIVE   Ketones, ur NEGATIVE NEGATIVE mg/dL   Protein, ur NEGATIVE NEGATIVE mg/dL   Nitrite NEGATIVE NEGATIVE   Leukocytes,Ua NEGATIVE NEGATIVE    Comment: Performed at [facility], [address] [place], [place] [address]  Rapid urine drug screen (hospital performed)     Status: Abnormal   Collection Time: 02/03/19  1:34 AM  Result Value Ref Range   Opiates NONE DETECTED NONE DETECTED   Cocaine POSITIVE (A) NONE  DETECTED   Benzodiazepines POSITIVE (A) NONE DETECTED   Amphetamines NONE DETECTED NONE DETECTED   Tetrahydrocannabinol NONE DETECTED NONE DETECTED   Barbiturates NONE DETECTED NONE DETECTED    Comment: (NOTE) DRUG SCREEN FOR MEDICAL PURPOSES ONLY.  IF CONFIRMATION IS NEEDED FOR ANY PURPOSE, NOTIFY LAB WITHIN 5 DAYS. LOWEST DETECTABLE LIMITS FOR URINE DRUG SCREEN Drug Class                     Cutoff (ng/mL) Amphetamine and metabolites    1000 Barbiturate and metabolites    200 Benzodiazepine                 200 Tricyclics and metabolites     300 Opiates and metabolites        300 Cocaine and metabolites        300 THC                            50 Performed at [facility], [address] [place], [place] [address]   Surgical pcr screen     Status: Abnormal   Collection Time: 02/03/19  3:05 AM  Result Value Ref Range   MRSA, PCR NEGATIVE NEGATIVE   Staphylococcus aureus POSITIVE (A) NEGATIVE    Comment: CRITICAL RESULT CALLED TO, READ BACK BY AND VERIFIED WITH: RN, Cipriano Mile [id number]  THANEY Performed at [facility], [address] [place], [place] [address]   HIV antibody     Status: None   Collection Time: 02/03/19  3:35 AM  Result Value Ref Range   HIV Screen 4th Generation wRfx Non Reactive Non Reactive    Comment: (NOTE) Performed At: [facility] [address] [place], [place] [id number] Jolene Schimke MD LK:[id number]   CBC with  Differential/Platelet     Status: Abnormal   Collection Time: 02/03/19  3:35 AM  Result Value Ref Range   WBC 17.9 (H) 4.0 - 10.5 K/uL   RBC 4.28 4.22 - 5.81 MIL/uL   Hemoglobin 13.4 13.0 - 17.0 g/dL   HCT 16.138.2 (L) 09.639.0 - 04.552.0 %   MCV 89.3 80.0 - 100.0 fL   MCH 31.3 26.0 - 34.0 pg   MCHC 35.1 30.0 - 36.0 g/dL   RDW 40.912.5 81.111.5 - 91.415.5 %   Platelets 241 150 - 400 K/uL   nRBC 0.0 0.0 - 0.2 %   Neutrophils Relative % 69 %   Neutro Abs 12.5 (H) 1.7 - 7.7 K/uL   Lymphocytes Relative 17 %   Lymphs Abs 3.0  0.7 - 4.0 K/uL   Monocytes Relative 13 %   Monocytes Absolute 2.3 (H) 0.1 - 1.0 K/uL   Eosinophils Relative 0 %   Eosinophils Absolute 0.0 0.0 - 0.5 K/uL   Basophils Relative 0 %   Basophils Absolute 0.1 0.0 - 0.1 K/uL   Immature Granulocytes 1 %   Abs Immature Granulocytes 0.09 (H) 0.00 - 0.07 K/uL    Comment: Performed at [facility], [address] [place], [place] [address]  Comprehensive metabolic panel     Status: Abnormal   Collection Time: 02/03/19  3:35 AM  Result Value Ref Range   Sodium 138 135 - 145 mmol/L   Potassium 3.8 3.5 - 5.1 mmol/L   Chloride 103 98 - 111 mmol/L   CO2 24 22 - 32 mmol/L   Glucose, Bld 105 (H) 70 - 99 mg/dL   BUN 9 6 - 20 mg/dL   Creatinine, Ser 6.210.93 0.61 - 1.24 mg/dL   Calcium 8.2 (L) 8.9 - 10.3 mg/dL   Total Protein 6.5 6.5 - 8.1 g/dL   Albumin 3.1 (L) 3.5 - 5.0 g/dL   AST 39 15 - 41 U/L   ALT 61 (H) 0 - 44 U/L   Alkaline Phosphatase 53 38 - 126 U/L   Total Bilirubin 0.9 0.3 - 1.2 mg/dL   GFR calc non Af Amer >60 >60 mL/min   GFR calc Af Amer >60 >60 mL/min   Anion gap 11 5 - 15    Comment: Performed at [facility], [address] [place], [place] [address]  Protime-INR     Status: None   Collection Time: 02/03/19  3:35 AM  Result Value Ref Range   Prothrombin Time 14.3 11.4 - 15.2 seconds   INR 1.1 0.8 - 1.2    Comment: (NOTE) INR goal varies based on device and disease states. Performed at [facility], [address] [place], [place] [address]     Dg Forearm Right  Result Date: 02/02/2019 CLINICAL DATA:  Infection, swelling, redness to forearm EXAM: RIGHT FOREARM - 2 VIEW COMPARISON:  None. FINDINGS: Soft tissue swelling over the proximal to mid forearm. No radiopaque foreign bodies. No underlying bone destruction to suggest osteomyelitis. No fracture, subluxation or dislocation. IMPRESSION: No acute bony abnormality. Electronically Signed   By: Charlett NoseKevin  Dover M.D.   On: 02/02/2019 23:54    ROS as noted in the  medical chart. Blood pressure 125/68, pulse 89, temperature 99.3 F (37.4 C), temperature source Oral, resp. rate 16, height 5' 8.5" (1.74 m), weight 73.6 kg, SpO2 98 %. Physical Exam  Patient is alert and oriented person place and time in no acute distress. On examination the right upper extremity  the patient does have a large amount of swelling and redness over the dorsal ulnar and volar ulnar aspect of the forearm.  The patient does have a prominent eschar and wound along the subcutaneous border the ulna.  The patient is able to flex and extend his elbow.  He is able to extend his thumb and extend his digits his fingertips are warm well perfused good capillary refill.  Assessment/Plan: Right forearm abscess complicated abscess  Right forearm incision and drainage  Patient does have a complicated abscess from intravenous drug use.  It is recommended the patient undergo surgery. The patient may require multiple surgeries based on the severity of the infection. Patient remained an inpatient. Patient voiced understanding the plan  Sharma Covert 02/03/2019, 4:58 PM

## 2019-02-04 ENCOUNTER — Encounter (HOSPITAL_COMMUNITY): Payer: Self-pay | Admitting: Orthopedic Surgery

## 2019-02-04 DIAGNOSIS — F1910 Other psychoactive substance abuse, uncomplicated: Secondary | ICD-10-CM

## 2019-02-04 DIAGNOSIS — F17200 Nicotine dependence, unspecified, uncomplicated: Secondary | ICD-10-CM

## 2019-02-04 DIAGNOSIS — L02419 Cutaneous abscess of limb, unspecified: Secondary | ICD-10-CM

## 2019-02-04 LAB — HEPATITIS PANEL, ACUTE
HCV Ab: >11 s/co ratio — ABNORMAL HIGH (ref 0.0–0.9)
Hep A IgM: NEGATIVE
Hep B C IgM: NEGATIVE
Hepatitis B Surface Ag: NEGATIVE

## 2019-02-04 LAB — URINE CULTURE: Culture: NO GROWTH

## 2019-02-04 LAB — VANCOMYCIN, PEAK
Vancomycin Pk: <4 ug/mL — ABNORMAL LOW (ref 30–40)
Vancomycin Pk: 17 ug/mL — ABNORMAL LOW (ref 30–40)

## 2019-02-04 MED ORDER — HYDROXYZINE HCL 25 MG PO TABS
50.0000 mg | ORAL_TABLET | Freq: Once | ORAL | Status: AC | PRN
  Administered 2019-02-04: 50 mg via ORAL
  Filled 2019-02-04: qty 2

## 2019-02-04 MED ORDER — HYDROMORPHONE HCL 1 MG/ML IJ SOLN
1.0000 mg | INTRAMUSCULAR | Status: DC | PRN
  Administered 2019-02-04 – 2019-02-05 (×5): 1 mg via INTRAVENOUS
  Filled 2019-02-04 (×5): qty 1

## 2019-02-04 MED ORDER — MORPHINE SULFATE (PF) 4 MG/ML IV SOLN: 4.0000 mg | Freq: Once | INTRAVENOUS | Status: DC

## 2019-02-04 MED ORDER — MORPHINE SULFATE (PF) 4 MG/ML IV SOLN
4.0000 mg | INTRAVENOUS | Status: DC | PRN
  Administered 2019-02-04: 12:00:00 4 mg via INTRAVENOUS
  Filled 2019-02-04: qty 1

## 2019-02-04 NOTE — Progress Notes (Signed)
Called to room to speak with patient who wants to leave AMA. Patient pacing in hallway. Spoke with Matthew Carroll. He shares his frustrations about "laying here and hurting". States, "I'll just go home and hurt". Pain medication orders reviewed. Ibuprofen given recently. Morphine given at 0941. Emotional support provided. RN asked about calling family. Patient reports he has already spoken with them and that did not help. Able to redirect patient to his room. Patient agreed to wait until RN could notify medical team of his c/o pain and plan to leave AMA. Resident paged and notified of patient's concerns. Resident to come to department and speak with patient.  Patient agrees to wait for a short period.

## 2019-02-04 NOTE — Progress Notes (Signed)
Family Medicine Teaching Service Daily Progress Note Intern Pager: [phone]  Patient name: Matthew Carroll Medical record number: [id number] Date of birth: June 04, 1986 Age: 33 y.o. Gender: male  Primary Care Provider: Lucky Cowboy, MD Consultants: hand surgery  Code Status: full  Pt Overview and Major Events to Date:  4/23 admitted for R forearm abscess, OR I&D   Assessment and Plan: Matthew Carroll is a 33 y.o. male presenting with right forearm abscess/cellultitis . PMH is significant for anxiety, IV drug use, tobacco use, homelessness  R forearm abscess s/p I&D. Patient underwent OR incision and drainage with 50-75cc purulence expressed on 4/23. He is afebrile but continues to have leukocytosis to 17.9 this morning. Wound culture showing gram pos cocci and BC showing NG x2d. MRSA PCR positive. Received tdap. -vital per routine -continue vanc (4/23-) and stop cefepime (4/23-4/24) -hand surgery planning for repeat I&D 4/25, NPO at midnight  -morphine 4mg  q6prn pain -monitor CBC -neurovascular checks q4h -PT/OT -follow wound cultures  Hyponatremia. Resolved - monitor on daily labs  Mild transaminitis. AST and ALT improved 45>39 and 67>61, respectively. Patient with h/o IVDU. INR 1.1 -hepatitis panel pending -monitor on AM CMP  Anxiety. - continue paxil  Polysubstance use. Reported heroin use, last use 1 week ago. UDS positive for benzodiazepines and cocaine.  -monitor for symptoms of withdrawal  -encourage cessation   Tobacco abuse.  -nicotine patch   FEN/GI: regular diet with NPO at midnight Prophylaxis: SCDs, holding pharmacologic in preparation for repeat surgery    Disposition: pending repeat surgery 4/25  Subjective:  Patient states that his R arm is painful but has been receiving pain medications. He is moving his bowels. Has no other complaints today.   Objective: Temp:  [98.1 F (36.7 C)-99.7 F (37.6 C)] 99.3 F (37.4 C) (04/24 0521) Pulse Rate:   [80-101] 80 (04/24 0521) Resp:  [18-23] 18 (04/24 0521) BP: (110-135)/(72-81) 110/73 (04/24 0521) SpO2:  [95 %-98 %] 96 % (04/24 0521) Weight:  [76.5 kg] 76.5 kg (04/24 0521) Physical Exam: General: sitting up in bed, in NAD Cardiovascular: RRR, no mumurs Respiratory: CTAB, NWOB on RA Abdomen: soft, nontender, nondistended,  + bowel sounds Extremities: R forearm with dressing c/d/i. 2+ radial pulse in R wrist, < 3 sec cap refill, sensation intact, 2+ pitting edema of R hand Skin: pink foam on balls of both feet without surrounding erythema  Laboratory: Recent Labs  Lab 02/02/19 2310 02/03/19 0335  WBC 16.5* 17.9*  HGB 13.1 13.4  HCT 38.8* 38.2*  PLT 236 241   Recent Labs  Lab 02/02/19 2310 02/03/19 0335  NA 133* 138  K 3.8 3.8  CL 97* 103  CO2 23 24  BUN 12 9  CREATININE 0.97 0.93  CALCIUM 8.3* 8.2*  PROT 6.9 6.5  BILITOT 1.2 0.9  ALKPHOS 46 53  ALT 67* 61*  AST 45* 39  GLUCOSE 107* 105*    HIV: Non Reactive (04/23 0335)  Drugs of Abuse     Component Value Date/Time   LABOPIA NONE DETECTED 02/03/2019 0134   COCAINSCRNUR POSITIVE (A) 02/03/2019 0134   LABBENZ POSITIVE (A) 02/03/2019 0134   AMPHETMU NONE DETECTED 02/03/2019 0134   THCU NONE DETECTED 02/03/2019 0134   LABBARB NONE DETECTED 02/03/2019 0134    MRSA PCR nasal swab positive  COVID neg  Imaging/Diagnostic Tests: Dg Forearm Right  Result Date: 02/02/2019 CLINICAL DATA:  Infection, swelling, redness to forearm EXAM: RIGHT FOREARM - 2 VIEW COMPARISON:  None. FINDINGS: Soft tissue swelling  over the proximal to mid forearm. No radiopaque foreign bodies. No underlying bone destruction to suggest osteomyelitis. No fracture, subluxation or dislocation. IMPRESSION: No acute bony abnormality. Electronically Signed   By: Charlett NoseKevin  Dover M.D.   On: 02/02/2019 23:54    Matthew Carroll, Matthew Peraza J, DO 02/04/2019, 8:52 AM PGY-3, [place] FPTS Intern pager: [phone], text pages welcome

## 2019-02-04 NOTE — Progress Notes (Signed)
Spoke with pharmacist about vancomycin orders and miscommunication. Pharmacist helped RN resolve issues.

## 2019-02-04 NOTE — Progress Notes (Signed)
Spoke with Dr. Artist Pais, patient willing to stay. Patient getting a dose of morphine now and then every 4 hours per new request. Patient will stay as long as order is followed. Will continue to follow up with patient.

## 2019-02-04 NOTE — Anesthesia Postprocedure Evaluation (Signed)
Anesthesia Post Note  Patient: Matthew Carroll  Procedure(s) Performed: IRRIGATION AND DEBRIDEMENT FOREARM (Right )     Patient location during evaluation: PACU Anesthesia Type: General Level of consciousness: awake and alert Pain management: pain level controlled Vital Signs Assessment: post-procedure vital signs reviewed and stable Respiratory status: spontaneous breathing, nonlabored ventilation, respiratory function stable and patient connected to nasal cannula oxygen Cardiovascular status: blood pressure returned to baseline and stable Postop Assessment: no apparent nausea or vomiting Anesthetic complications: no    Last Vitals:  Vitals:   02/04/19 0521 02/04/19 1406  BP: 110/73 113/69  Pulse: 80 73  Resp: 18 16  Temp: 37.4 C 37 C  SpO2: 96% 95%    Last Pain:  Vitals:   02/04/19 1406  TempSrc: Oral  PainSc:                  Kennieth Rad

## 2019-02-04 NOTE — Evaluation (Signed)
Physical Therapy One-time Evaluation Patient Details Name: Matthew Carroll MRN: [id number] DOB: Mar 01, 1986 Today's Date: 02/04/2019   History of Present Illness  33 yo male admitted to ED with RUE swelling and pain. Pt s/p drainage of deep abscess of R volar/dorsal forearm on 4/23. Pt is homeless. PMH includes polysubstance abuse, ADD, anxiety.  Clinical Impression   Pt presents with decreased safety awareness, but otherwise pt is [facility] with mobility during PT eval. Pt ambulated 1200 ft without AD with no unsteadiness noted. Pt states he feels like he is at baseline level of mobility at this time. Pt expressed to PT that he wants to leave the hospital as soon as possible, and states "they aren't giving me good pain medicine. I can leave here and get it myself". Pt appropriate to mobilize in halls with RN/NT at this time. PT to sign off, please re-consult if needed.     Follow Up Recommendations No PT follow up    Equipment Recommendations  None recommended by PT    Recommendations for Other Services       Precautions / Restrictions Precautions Precautions: None Restrictions Weight Bearing Restrictions: No      Mobility  Bed Mobility Overal bed mobility: Modified Independent             General bed mobility comments: Pt in bathroom upon PT arrival.   Transfers Overall transfer level: Modified independent   Transfers: Sit to/from Stand Sit to Stand: Modified independent (Device/Increase time)         General transfer comment: Increased time to stand due to RUE pain, no physical assist provided by PT.   Ambulation/Gait Ambulation/Gait assistance: Modified independent (Device/Increase time) Gait Distance (Feet): 1200 Feet Assistive device: None Gait Pattern/deviations: Step-through pattern;Drifts right/left Gait velocity: slightly decr    General Gait Details: Mod I for slow gait speed, occasional weaving of gait. Pt able to tolerate ambulation challenges, including  head turns, fast/slow walking.   Stairs            Wheelchair Mobility    Modified Rankin (Stroke Patients Only)       Balance Overall balance assessment: Mild deficits observed, not formally tested                                           Pertinent Vitals/Pain Pain Assessment: 0-10 Pain Score: 8  Pain Location: RUE Pain Descriptors / Indicators: Sore Pain Intervention(s): Limited activity within patient's tolerance;Premedicated before session;Monitored during session;Repositioned    Home Living Family/patient expects to be discharged to:: Shelter/Homeless Living Arrangements: Spouse/significant other               Additional Comments: Pt reports being married for the past 5 years, states wife is intermittently hospitalized.    Prior Function Level of Independence: Independent               Hand Dominance   Dominant Hand: Right    Extremity/Trunk Assessment   Upper Extremity Assessment Upper Extremity Assessment: Defer to OT evaluation    Lower Extremity Assessment Lower Extremity Assessment: Overall WFL for tasks assessed    Cervical / Trunk Assessment Cervical / Trunk Assessment: Normal  Communication   Communication: No difficulties  Cognition Arousal/Alertness: Awake/alert Behavior During Therapy: Restless Overall Cognitive Status: No family/caregiver present to determine baseline cognitive functioning  General Comments: Pt with decreased safety awareness, as pt bumps into walls intermittently with ambulation. Pt states "I get lost in my thoughts". Pt with slow processing, but responds to questions appropriately.       General Comments      Exercises     Assessment/Plan    PT Assessment Patent does not need any further PT services  PT Problem List Decreased safety awareness       PT Treatment Interventions Balance training;Functional mobility training;Gait  training;Therapeutic activities    PT Goals (Current goals can be found in the Care Plan section)  Acute Rehab PT Goals Patient Stated Goal: get out of here PT Goal Formulation: With patient Time For Goal Achievement: 02/04/19 Potential to Achieve Goals: Good    Frequency     Barriers to discharge Other (comment)(homelessness)      Co-evaluation               AM-PAC PT "6 Clicks" Mobility  Outcome Measure Help needed turning from your back to your side while in a flat bed without using bedrails?: None Help needed moving from lying on your back to sitting on the side of a flat bed without using bedrails?: None Help needed moving to and from a bed to a chair (including a wheelchair)?: None Help needed standing up from a chair using your arms (e.g., wheelchair or bedside chair)?: None Help needed to walk in hospital room?: None Help needed climbing 3-5 steps with a railing? : None 6 Click Score: 24    End of Session   Activity Tolerance: Patient tolerated treatment well Patient left: in bed;Other (comment)(with OT in room) Nurse Communication: Mobility status PT Visit Diagnosis: Other abnormalities of gait and mobility (R26.89)    Time: 1005-1025 PT Time Calculation (min) (ACUTE ONLY): 20 min   Charges:   PT Evaluation $PT Eval Low Complexity: 1 Low         Aarthi Uyeno Terrial Rhodes Any Mcneice, PT Acute Rehabilitation Services Pager [phone]  Office [phone]   Ammaar Encina D Despina Hiddenure 02/04/2019, 12:24 PM

## 2019-02-04 NOTE — Progress Notes (Signed)
Paged by RN that morphine was not working to relieve his pain, patient requesting fentanyl or Dilaudid. Transitioned to Dilaudid of equivalent dose he was receiving via morphine.  Will monitor pain closely.  Allayne Stack, DO

## 2019-02-04 NOTE — Discharge Summary (Signed)
Family Medicine Teaching Service Hospital Discharge Summary  Patient left Against medical advice on 02/05/2019  Patient name: Matthew Carroll Medical record number: [id number] Date of birth: 01/26/1986 Age: 33 y.o. Gender: male Date of Admission: 02/02/2019  Date Patient left AMA: 02/05/2019 Admitting Physician: Todd D McDiarmid, MD  Primary Care Provider: McKeown, William, MD Consultants: hand surgery  Indication for Hospitalization: R forearm abscess  Discharge Diagnoses/Problem List:  R forearm abscess s/p I&D  Polysubstance abuse with IV drug use Transaminitis anxiety  Disposition: LEFT AMA  Discharge Condition: stable  Discharge Exam:  General: 33 year old male, no acute distress Respiratory: no respiratory distress, no shortness of breath Right Forearm: splint in place, minimal swelling right forearm Psych: appropriate, pleasant   Brief Hospital Course:  Lakin W Parisi is a 33 y.o. male with PMH significant for IV drug use and anxiety who presented with R hand edema and erythema. He met sepsis criteria with fever, tachypnea and tachycardia. He was found to have soft tissue swelling over proximal to mid R forearm on xray. He was taken to OR for I&D on 4/23 and again on 4/25 with purulence expressed. He was initially started on vanc/cefepirme but the cefepime was discontinued on 02/04/2019. Patient left AMA in the late PM of 02/05/2019. Patient had no growth on cultures from 4/23 I&D at time patient left AMA. Patient could not be de-escalated to oral abx prior to leaving as there was no culture data to support an oral antibiotic choice.  Per the post-op note, patient to leave splint in place until follow up on 02/10/2019. As patient left AMA he was not discharged with any antibiotics.    Issues for Follow Up:  1. Follow with hand surgery on 02/10/2019  Significant Procedures: right forearm I&D 4/23 and 4/25  Significant Labs and Imaging:  Recent Labs  Lab 02/02/19 2310  02/03/19 0335 02/05/19 0326  WBC 16.5* 17.9* 18.0*  HGB 13.1 13.4 13.2  HCT 38.8* 38.2* 36.9*  PLT 236 241 322   Recent Labs  Lab 02/02/19 2310 02/03/19 0335 02/05/19 0326  NA 133* 138 140  K 3.8 3.8 3.5  CL 97* 103 105  CO2 23 24 25  GLUCOSE 107* 105* 117*  BUN 12 9 11  CREATININE 0.97 0.93 0.86  CALCIUM 8.3* 8.2* 8.6*  ALKPHOS 46 53  --   AST 45* 39  --   ALT 67* 61*  --   ALBUMIN 3.3* 3.1*  --     Results/Tests Pending at Time of Discharge:   Discharge Medications:  Allergies as of 02/05/2019   No Known Allergies   Med Rec must be completed prior to using this SMARTLINK       Discharge Instructions: Please refer to Patient Instructions section of EMR for full details.  Patient was counseled important signs and symptoms that should prompt return to medical care, changes in medications, dietary instructions, activity restrictions, and follow up appointments.   Follow-Up Appointments: Follow-up Information    Ortmann, Fred, MD Follow up.   Specialty:  Orthopedic Surgery Why:  Please schedule follow up with Dr. Ortmann on [address], [place], [place] [address] Contact information: [address] [place]  [address] [phone]           Fletcher, Jacob, MD 02/05/2019, 9:34 PM PGY-2,  Family Medicine 

## 2019-02-04 NOTE — Progress Notes (Signed)
Missing vanc dose sent pharmacy a note. Waiting for med

## 2019-02-04 NOTE — Progress Notes (Signed)
Occupational Therapy Evaluation Patient Details Name: Matthew Carroll MRN: [id number] DOB: [birth year] Today's Date: 02/04/2019    History of Present Illness 33 yo male admitted to ED with RUE swelling and pain. Pt s/p drainage of deep abscess of R volar/dorsal forearm on 4/23. Pt is homeless. PMH includes polysubstance abuse, ADD, anxiety.   Clinical Impression   Pt admitted with above deficits. Pt presents with right UE pain and edema.  PTA, pt is independent and is homeless.  Currently he is mod I with ADLs and functional mobility. Therapist completed education on ice, elevation and ROM.  Demonstrated how to elevate in bed and when in recliner using pillows/blankets. Provided ice at end of session. No further acute OT needs.Please re-order if patient experiences a decline in function. Will sign off.     Follow Up Recommendations  No OT follow up    Equipment Recommendations  None recommended by OT    Recommendations for Other Services       Precautions / Restrictions Precautions Precautions: None Restrictions Weight Bearing Restrictions: No      Mobility Bed Mobility Overal bed mobility: Modified Independent             General bed mobility comments: Pt in bathroom upon PT arrival.   Transfers Overall transfer level: Modified independent   Transfers: Sit to/from Stand Sit to Stand: Modified independent (Device/Increase time)            Balance Overall balance assessment: Mild deficits observed, not formally tested                                         ADL either performed or assessed with clinical judgement   ADL Overall ADL's : Modified independent                                             Vision         Perception     Praxis      Pertinent Vitals/Pain Pain Assessment: 0-10 Pain Score: 8  Pain Location: RUE Pain Descriptors / Indicators: Sore Pain Intervention(s): Limited activity within patient's  tolerance;Ice applied;Repositioned     Hand Dominance Right   Extremity/Trunk Assessment Upper Extremity Assessment Upper Extremity Assessment: RUE deficits/detail RUE Deficits / Details: Shoulder, wrist and hand AROM WFL. Edema noted in hand. Unable to assess elbow ROM due to dressing.   Lower Extremity Assessment Lower Extremity Assessment: Overall WFL for tasks assessed   Cervical / Trunk Assessment Cervical / Trunk Assessment: Normal   Communication Communication Communication: No difficulties   Cognition Arousal/Alertness: Awake/alert Behavior During Therapy: Flat affect Overall Cognitive Status: No family/caregiver present to determine baseline cognitive functioning                                 General Comments: Pt with decreased safety awareness, as pt bumps into walls intermittently with ambulation. Pt states "I get lost in my thoughts". Pt with slow processing, but responds to questions appropriately.    General Comments  Educated pt on ice,elevation and ROM for right UE in order to minimize swelling and pain.     Exercises Exercises: General Upper Extremity General Exercises - Upper Extremity Shoulder Flexion:  AROM;Right;10 reps;Seated Shoulder Extension: AROM;Right;10 reps;Seated Wrist Flexion: AROM;Right;10 reps;Seated Wrist Extension: AROM;Right;10 reps;Seated Digit Composite Flexion: AROM;Right;10 reps;Seated Composite Extension: AROM;Right;10 reps;Seated   Shoulder Instructions      Home Living Family/patient expects to be discharged to:: Shelter/Homeless Living Arrangements: Spouse/significant other                               Additional Comments: Pt reports being married for the past 5 years, states wife is intermittently hospitalized.      Prior Functioning/Environment Level of Independence: Independent                 OT Problem List: Pain      OT Treatment/Interventions:      OT Goals(Current goals can  be found in the care plan section) Acute Rehab OT Goals Patient Stated Goal: get out of here  OT Frequency:     Barriers to D/C:            Co-evaluation              AM-PAC OT "6 Clicks" Daily Activity     Outcome Measure Help from another person eating meals?: None Help from another person taking care of personal grooming?: None Help from another person toileting, which includes using toliet, bedpan, or urinal?: None Help from another person bathing (including washing, rinsing, drying)?: None Help from another person to put on and taking off regular upper body clothing?: None Help from another person to put on and taking off regular lower body clothing?: None 6 Click Score: 24   End of Session Nurse Communication: Mobility status  Activity Tolerance: Patient tolerated treatment well Patient left: with call bell/phone within reach;in bed  OT Visit Diagnosis: Pain Pain - Right/Left: Right Pain - part of body: Arm                Time: 1610-96041025-1052 OT Time Calculation (min): 27 min Charges:  OT General Charges $OT Visit: 1 Visit OT Evaluation $OT Eval Low Complexity: 1 Low OT Treatments $Therapeutic Activity: 8-22 mins    Cipriano MileJohnson, Jenna Elizabeth OTR/L Acute Rehabilitation Services [phone] 02/04/2019, 1:26 PM

## 2019-02-04 NOTE — TOC Initial Note (Signed)
Transition of Care [facility]) - Initial/Assessment Note    Patient Details  Name: Matthew Carroll MRN: [id number] Date of Birth: 04/25/1986  Transition of Care [facility]) CM/SW Contact:    Alexander Mt, [place] Phone Number: 02/04/2019, 12:06 PM  Clinical Narrative:                 CSW met with pt at bedside, introduced self, role, reason for visit. Pt amenable to speaking with CSW. States he is having some pain in his arm. Pt engaged with CSW, he lives in a tent in an [place] with others. He has been on and off in this for several years. Pt states that he has a family member (per RN report believe this is an ex partner) and provided her number for emergency contact. Pt admits to polysubstance use and to injecting drugs on and off. Pt aware that this is life threatening. He has never been to rehab before but is aware of some in the area. We discussed the [place] substance use resource packet should pt be interested in cessation, medication management, and meeting with substance use counselor. Pt aware that many inpatient settings are limited right now due to the virus.   Pt also provided with flyer for [facility]. We discussed that this is a great resource should pt be looking for cessation resources or just wanting support from others that have a hx of substance use. We discussed safe injection supplies being available through this program and the importance of if pt is not willing to going through process of quitting substances that utilizing harm reduction methods is best to avoid continued infections. Pt voiced understanding and appeared interested in [place] information.   CSW explained to pt that our department remains available as pt progresses for needs as best we can help. Will follow as needed through pt stay, he would like to return to his tent at discharge and knows that the [facility] is open should he need further resources/case management. Pt will likely need [facility] program should he need any meds at  discharge. As previously mentioned pt would need to remain in house if he requires long term iv abx for infection.    Expected Discharge Plan: Home/Self Care Barriers to Discharge: Homeless with medical needs, Continued Medical Work up   Patient Goals and CMS Choice Patient states their goals for this hospitalization and ongoing recovery are:: to get his arm better   Choice offered to / list presented to : Patient  Expected Discharge Plan and Services Expected Discharge Plan: Home/Self Care In-house Referral: Clinical Social Work, Conservation officer, nature Services: [place] [facility], Medication Assistance, CM Consult   Living arrangements for the past 2 months: No permanent address, Homeless                                      Prior Living Arrangements/Services Living arrangements for the past 2 months: No permanent address, Homeless Lives with:: Self Patient language and need for interpreter reviewed:: Yes Do you feel safe going back to the place where you live?: Yes      Need for Family Participation in Patient Care: No (Comment)(pt a&o x4, ambulatory) Care giver support system in place?: No (comment)(pt does keep in contact with family but limited support)   Criminal Activity/Legal Involvement Pertinent to Current Situation/Hospitalization: No - Comment as needed  Activities of Daily Living Home Assistive Devices/Equipment: None  ADL Screening (condition at time of admission) Patient's cognitive ability adequate to safely complete daily activities?: Yes Is the patient deaf or have difficulty hearing?: No Does the patient have difficulty seeing, even when wearing glasses/contacts?: No Does the patient have difficulty concentrating, remembering, or making decisions?: Yes Patient able to express need for assistance with ADLs?: Yes Does the patient have difficulty dressing or bathing?: No Independently performs ADLs?: Yes (appropriate for developmental  age) Does the patient have difficulty walking or climbing stairs?: No Weakness of Legs: None Weakness of Arms/Hands: Right  Permission Sought/Granted Permission sought to share information with : Family Supports, Case Manager Permission granted to share information with : Yes, Verbal Permission Granted  Share Information with NAME: Autumn Oleson  Permission granted to share info w AGENCY: N/A  Permission granted to share info w Relationship: other  Permission granted to share info w Contact Information: [phone]  Emotional Assessment Appearance:: Appears stated age Attitude/Demeanor/Rapport: Engaged Affect (typically observed): Accepting, Adaptable, Appropriate Orientation: : Oriented to Self, Oriented to Place, Oriented to  Time, Oriented to Situation Alcohol / Substance Use: Illicit Drugs, Tobacco Use Psych Involvement: Outpatient Provider  Admission diagnosis:  Bug bite R arm Patient Active Problem List   Diagnosis Date Noted  . Tobacco use disorder   . Sepsis ([place]) 02/03/2019  . Abscess of arm 02/03/2019  . Heroin abuse ([place]) 02/03/2019  . IVDA (intravenous drug abuse) complicating pregnancy ([place]) 02/03/2019  . Polysubstance abuse ([place]) 02/03/2019  . Cellulitis of right upper extremity   . 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 Alcohol abuse 01/29/2015  . ADD (attention deficit disorder)   . Hyperlipidemia   . Vitamin D deficiency   . Ingrown toenail 05/13/2011  . Anxiety 05/06/2011  . Diarrhea 08/01/2010  . Chronic abdominal pain 08/01/2010   PCP:  Unk Pinto, MD Pharmacy:   [id number] [place] [place] [address] [id number]: [phone]: [phone]    Social Determinants of Health (SDOH) Interventions    Readmission Risk Interventions No flowsheet data found.

## 2019-02-04 NOTE — Plan of Care (Signed)
  Problem: Education: Goal: Knowledge of General Education information will improve Description Including pain rating scale, medication(s)/side effects and non-pharmacologic comfort measures Outcome: Progressing   Problem: Clinical Measurements: Goal: Cardiovascular complication will be avoided Outcome: Progressing   Problem: Activity: Goal: Risk for activity intolerance will decrease Outcome: Progressing   Problem: Elimination: Goal: Will not experience complications related to bowel motility Outcome: Progressing   Problem: Safety: Goal: Ability to remain free from injury will improve Outcome: Progressing

## 2019-02-05 ENCOUNTER — Encounter (HOSPITAL_COMMUNITY): Payer: Self-pay | Admitting: Certified Registered Nurse Anesthetist

## 2019-02-05 ENCOUNTER — Inpatient Hospital Stay (HOSPITAL_COMMUNITY): Payer: Self-pay | Admitting: Anesthesiology

## 2019-02-05 ENCOUNTER — Encounter (HOSPITAL_COMMUNITY)
Admission: EM | Discharge status: Against Medical Advice | Payer: Self-pay | Source: Home / Self Care | Attending: Family Medicine

## 2019-02-05 HISTORY — PX: I&D EXTREMITY: SHX5045

## 2019-02-05 LAB — BASIC METABOLIC PANEL
Anion gap: 10 (ref 5–15)
BUN: 11 mg/dL (ref 6–20)
CO2: 25 mmol/L (ref 22–32)
Calcium: 8.6 mg/dL — ABNORMAL LOW (ref 8.9–10.3)
Chloride: 105 mmol/L (ref 98–111)
Creatinine, Ser: 0.86 mg/dL (ref 0.61–1.24)
GFR calc Af Amer: >60 mL/min (ref >60)
GFR calc non Af Amer: >60 mL/min (ref >60)
Glucose, Bld: 117 mg/dL — ABNORMAL HIGH (ref 70–99)
Potassium: 3.5 mmol/L (ref 3.5–5.1)
Sodium: 140 mmol/L (ref 135–145)

## 2019-02-05 LAB — CBC
HCT: 36.9 % — ABNORMAL LOW (ref 39.0–52.0)
Hemoglobin: 13.2 g/dL (ref 13.0–17.0)
MCH: 31.1 pg (ref 26.0–34.0)
MCHC: 35.8 g/dL (ref 30.0–36.0)
MCV: 86.8 fL (ref 80.0–100.0)
Platelets: 322 10*3/uL (ref 150–400)
RBC: 4.25 MIL/uL (ref 4.22–5.81)
RDW: 12.2 % (ref 11.5–15.5)
WBC: 18 10*3/uL — ABNORMAL HIGH (ref 4.0–10.5)
nRBC: 0 % (ref 0.0–0.2)

## 2019-02-05 SURGERY — IRRIGATION AND DEBRIDEMENT EXTREMITY
Anesthesia: General | Site: Arm Lower | Laterality: Right

## 2019-02-05 MED ORDER — PROMETHAZINE HCL 25 MG/ML IJ SOLN
6.2500 mg | INTRAMUSCULAR | Status: DC | PRN
  Administered 2019-02-05: 09:00:00 12.5 mg via INTRAVENOUS

## 2019-02-05 MED ORDER — DEXAMETHASONE SODIUM PHOSPHATE 10 MG/ML IJ SOLN
INTRAMUSCULAR | Status: DC | PRN
  Administered 2019-02-05: 5 mg via INTRAVENOUS

## 2019-02-05 MED ORDER — MIDAZOLAM HCL 2 MG/2ML IJ SOLN
INTRAMUSCULAR | Status: AC
  Filled 2019-02-05: qty 2

## 2019-02-05 MED ORDER — ONDANSETRON HCL 4 MG/2ML IJ SOLN
INTRAMUSCULAR | Status: DC | PRN
  Administered 2019-02-05: 4 mg via INTRAVENOUS

## 2019-02-05 MED ORDER — FENTANYL CITRATE (PF) 250 MCG/5ML IJ SOLN
INTRAMUSCULAR | Status: AC
  Filled 2019-02-05: qty 5

## 2019-02-05 MED ORDER — PROMETHAZINE HCL 25 MG/ML IJ SOLN
INTRAMUSCULAR | Status: AC
  Administered 2019-02-05: 09:00:00 12.5 mg via INTRAVENOUS
  Filled 2019-02-05: qty 1

## 2019-02-05 MED ORDER — PROPOFOL 10 MG/ML IV BOLUS
INTRAVENOUS | Status: AC
  Filled 2019-02-05: qty 20

## 2019-02-05 MED ORDER — HYDROMORPHONE HCL 1 MG/ML IJ SOLN
INTRAMUSCULAR | Status: AC
  Administered 2019-02-05: 09:00:00 0.5 mg via INTRAVENOUS
  Filled 2019-02-05: qty 1

## 2019-02-05 MED ORDER — CHLORHEXIDINE GLUCONATE 4 % EX LIQD
CUTANEOUS | Status: AC
  Filled 2019-02-05: qty 15

## 2019-02-05 MED ORDER — KETOROLAC TROMETHAMINE 30 MG/ML IJ SOLN
30.0000 mg | Freq: Once | INTRAMUSCULAR | Status: AC
  Administered 2019-02-05: 30 mg via INTRAVENOUS

## 2019-02-05 MED ORDER — 0.9 % SODIUM CHLORIDE (POUR BTL) OPTIME
TOPICAL | Status: DC | PRN
  Administered 2019-02-05: 1000 mL

## 2019-02-05 MED ORDER — ACETAMINOPHEN 10 MG/ML IV SOLN
INTRAVENOUS | Status: AC
  Administered 2019-02-05: 09:00:00 1000 mg via INTRAVENOUS
  Filled 2019-02-05: qty 100

## 2019-02-05 MED ORDER — ACETAMINOPHEN 10 MG/ML IV SOLN
1000.0000 mg | Freq: Once | INTRAVENOUS | Status: DC | PRN
  Administered 2019-02-05: 09:00:00 1000 mg via INTRAVENOUS

## 2019-02-05 MED ORDER — KETOROLAC TROMETHAMINE 30 MG/ML IJ SOLN
INTRAMUSCULAR | Status: AC
  Administered 2019-02-05: 30 mg via INTRAVENOUS
  Filled 2019-02-05: qty 1

## 2019-02-05 MED ORDER — FENTANYL CITRATE (PF) 250 MCG/5ML IJ SOLN
INTRAMUSCULAR | Status: DC | PRN
  Administered 2019-02-05 (×5): 100 ug via INTRAVENOUS
  Administered 2019-02-05: 150 ug via INTRAVENOUS

## 2019-02-05 MED ORDER — KETAMINE HCL 50 MG/5ML IJ SOSY
PREFILLED_SYRINGE | INTRAMUSCULAR | Status: AC
  Filled 2019-02-05: qty 5

## 2019-02-05 MED ORDER — SUCCINYLCHOLINE CHLORIDE 200 MG/10ML IV SOSY
PREFILLED_SYRINGE | INTRAVENOUS | Status: DC | PRN
  Administered 2019-02-05: 80 mg via INTRAVENOUS

## 2019-02-05 MED ORDER — HYDROMORPHONE HCL 2 MG PO TABS
1.0000 mg | ORAL_TABLET | ORAL | Status: DC | PRN
  Administered 2019-02-05: 14:00:00 1 mg via ORAL
  Filled 2019-02-05: qty 1

## 2019-02-05 MED ORDER — LIDOCAINE 2% (20 MG/ML) 5 ML SYRINGE
INTRAMUSCULAR | Status: DC | PRN
  Administered 2019-02-05: 60 mg via INTRAVENOUS

## 2019-02-05 MED ORDER — LORAZEPAM 0.5 MG PO TABS
0.5000 mg | ORAL_TABLET | Freq: Once | ORAL | Status: AC | PRN
  Administered 2019-02-05: 0.5 mg via ORAL
  Filled 2019-02-05: qty 1

## 2019-02-05 MED ORDER — SODIUM CHLORIDE 0.9 % IR SOLN
Status: DC | PRN
  Administered 2019-02-05: 3000 mL

## 2019-02-05 MED ORDER — OXYCODONE HCL 5 MG/5ML PO SOLN: 5.0000 mg | Freq: Once | ORAL | Status: DC | PRN

## 2019-02-05 MED ORDER — RAMELTEON 8 MG PO TABS
8.0000 mg | ORAL_TABLET | Freq: Every day | ORAL | Status: DC
  Filled 2019-02-05: qty 1

## 2019-02-05 MED ORDER — OXYCODONE HCL 5 MG PO TABS: 5.0000 mg | ORAL_TABLET | Freq: Once | ORAL | Status: DC | PRN

## 2019-02-05 MED ORDER — KETAMINE HCL 10 MG/ML IJ SOLN
INTRAMUSCULAR | Status: DC | PRN
  Administered 2019-02-05: 20 mg via INTRAVENOUS
  Administered 2019-02-05: 30 mg via INTRAVENOUS

## 2019-02-05 MED ORDER — PROPOFOL 10 MG/ML IV BOLUS
INTRAVENOUS | Status: DC | PRN
  Administered 2019-02-05: 200 mg via INTRAVENOUS

## 2019-02-05 MED ORDER — MIDAZOLAM HCL 2 MG/2ML IJ SOLN
INTRAMUSCULAR | Status: DC | PRN
  Administered 2019-02-05: 2 mg via INTRAVENOUS

## 2019-02-05 MED ORDER — HYDROMORPHONE HCL 2 MG PO TABS
1.0000 mg | ORAL_TABLET | ORAL | Status: DC | PRN
  Administered 2019-02-05 (×2): 1 mg via ORAL
  Filled 2019-02-05 (×2): qty 1

## 2019-02-05 MED ORDER — HYDROMORPHONE HCL 1 MG/ML IJ SOLN
0.2500 mg | INTRAMUSCULAR | Status: DC | PRN
  Administered 2019-02-05 (×4): 0.5 mg via INTRAVENOUS

## 2019-02-05 MED ORDER — LACTATED RINGERS IV SOLN
INTRAVENOUS | Status: DC
  Administered 2019-02-05: 07:00:00 via INTRAVENOUS

## 2019-02-05 SURGICAL SUPPLY — 60 items
BANDAGE ACE 3X5.8 VEL STRL LF (GAUZE/BANDAGES/DRESSINGS) ×1 IMPLANT
BANDAGE ACE 4X5 VEL STRL LF (GAUZE/BANDAGES/DRESSINGS) ×3 IMPLANT
BNDG CMPR 9X4 STRL LF SNTH (GAUZE/BANDAGES/DRESSINGS) ×1
BNDG COHESIVE 1X5 TAN STRL LF (GAUZE/BANDAGES/DRESSINGS) IMPLANT
BNDG CONFORM 2 STRL LF (GAUZE/BANDAGES/DRESSINGS) IMPLANT
BNDG ESMARK 4X9 LF (GAUZE/BANDAGES/DRESSINGS) ×3 IMPLANT
BNDG GAUZE ELAST 4 BULKY (GAUZE/BANDAGES/DRESSINGS) ×3 IMPLANT
CORDS BIPOLAR (ELECTRODE) ×3 IMPLANT
COVER SURGICAL LIGHT HANDLE (MISCELLANEOUS) ×3 IMPLANT
COVER WAND RF STERILE (DRAPES) ×1 IMPLANT
CUFF TOURNIQUET SINGLE 18IN (TOURNIQUET CUFF) ×3 IMPLANT
CUFF TOURNIQUET SINGLE 24IN (TOURNIQUET CUFF) IMPLANT
DRAIN PENROSE 1/4X12 LTX STRL (WOUND CARE) IMPLANT
DRAPE SURG 17X23 STRL (DRAPES) ×3 IMPLANT
DRSG ADAPTIC 3X8 NADH LF (GAUZE/BANDAGES/DRESSINGS) ×1 IMPLANT
ELECT REM PT RETURN 9FT ADLT (ELECTROSURGICAL)
ELECTRODE REM PT RTRN 9FT ADLT (ELECTROSURGICAL) IMPLANT
GAUZE SPONGE 4X4 12PLY STRL (GAUZE/BANDAGES/DRESSINGS) ×3 IMPLANT
GAUZE SPONGE 4X4 12PLY STRL LF (GAUZE/BANDAGES/DRESSINGS) ×2 IMPLANT
GAUZE XEROFORM 1X8 LF (GAUZE/BANDAGES/DRESSINGS) ×1 IMPLANT
GAUZE XEROFORM 5X9 LF (GAUZE/BANDAGES/DRESSINGS) ×2 IMPLANT
GLOVE BIOGEL PI IND STRL 8.5 (GLOVE) ×1 IMPLANT
GLOVE BIOGEL PI INDICATOR 8.5 (GLOVE) ×2
GLOVE SURG ORTHO 8.0 STRL STRW (GLOVE) ×3 IMPLANT
GOWN STRL REUS W/ TWL LRG LVL3 (GOWN DISPOSABLE) ×3 IMPLANT
GOWN STRL REUS W/ TWL XL LVL3 (GOWN DISPOSABLE) ×1 IMPLANT
GOWN STRL REUS W/TWL LRG LVL3 (GOWN DISPOSABLE)
GOWN STRL REUS W/TWL XL LVL3 (GOWN DISPOSABLE) ×6
HANDPIECE INTERPULSE COAX TIP (DISPOSABLE)
KIT BASIN OR (CUSTOM PROCEDURE TRAY) ×3 IMPLANT
KIT TURNOVER KIT B (KITS) ×3 IMPLANT
MANIFOLD NEPTUNE II (INSTRUMENTS) ×3 IMPLANT
NDL HYPO 25GX1X1/2 BEV (NEEDLE) IMPLANT
NEEDLE HYPO 25GX1X1/2 BEV (NEEDLE) IMPLANT
NS IRRIG 1000ML POUR BTL (IV SOLUTION) ×3 IMPLANT
PACK ORTHO EXTREMITY (CUSTOM PROCEDURE TRAY) ×3 IMPLANT
PAD ARMBOARD 7.5X6 YLW CONV (MISCELLANEOUS) ×6 IMPLANT
PAD CAST 3X4 CTTN HI CHSV (CAST SUPPLIES) IMPLANT
PAD CAST 4YDX4 CTTN HI CHSV (CAST SUPPLIES) ×1 IMPLANT
PADDING CAST COTTON 3X4 STRL (CAST SUPPLIES) ×3
PADDING CAST COTTON 4X4 STRL (CAST SUPPLIES) ×3
SET CYSTO W/LG BORE CLAMP LF (SET/KITS/TRAYS/PACK) ×2 IMPLANT
SET HNDPC FAN SPRY TIP SCT (DISPOSABLE) IMPLANT
SOAP 2 % CHG 4 OZ (WOUND CARE) ×3 IMPLANT
SPLINT FIBERGLASS 3X35 (CAST SUPPLIES) ×2 IMPLANT
SPONGE LAP 18X18 RF (DISPOSABLE) ×3 IMPLANT
SPONGE LAP 4X18 RFD (DISPOSABLE) ×1 IMPLANT
SUT ETHILON 4 0 PS 2 18 (SUTURE) IMPLANT
SUT ETHILON 5 0 P 3 18 (SUTURE)
SUT NYLON ETHILON 5-0 P-3 1X18 (SUTURE) IMPLANT
SWAB COLLECTION DEVICE MRSA (MISCELLANEOUS) ×1 IMPLANT
SWAB CULTURE ESWAB REG 1ML (MISCELLANEOUS) IMPLANT
SYR CONTROL 10ML LL (SYRINGE) IMPLANT
TOWEL OR 17X24 6PK STRL BLUE (TOWEL DISPOSABLE) ×1 IMPLANT
TOWEL OR 17X26 10 PK STRL BLUE (TOWEL DISPOSABLE) ×3 IMPLANT
TUBE CONNECTING 12'X1/4 (SUCTIONS) ×1
TUBE CONNECTING 12X1/4 (SUCTIONS) ×2 IMPLANT
UNDERPAD 30X30 (UNDERPADS AND DIAPERS) ×3 IMPLANT
WATER STERILE IRR 1000ML POUR (IV SOLUTION) ×1 IMPLANT
YANKAUER SUCT BULB TIP NO VENT (SUCTIONS) ×3 IMPLANT

## 2019-02-05 NOTE — Progress Notes (Signed)
HepC antib +, ordered f/u Hepc Quantative and genotype  -Dr. Parke Simmers

## 2019-02-05 NOTE — Progress Notes (Signed)
Orthopedic Tech Progress Note Patient Details:  Matthew Carroll 01-17-1986 [id number] I was asked by RN to bring up a "mission sling" for comfort for patient. I showed him as well as let the RN know that the "mission sling" can also be worn as a regular arm sling one patient is comfort without it being in the air. Ortho Devices Type of Ortho Device: Sling arm elevator Ortho Device/Splint Location: URE Ortho Device/Splint Interventions: Adjustment, Application, Ordered   Post Interventions Patient Tolerated: Well Instructions Provided: Care of device, Adjustment of device   Donald Pore 02/05/2019, 10:47 AM

## 2019-02-05 NOTE — Op Note (Signed)
PREOPERATIVE DIAGNOSIS: Right forearm abscess Intravenous drug use  POSTOPERATIVE DIAGNOSIS: Same  ATTENDING SURGEON: Dr. Bradly Bienenstock who is scrubbed and present for the entire procedure  ASSISTANT SURGEON: None  ANESTHESIA: General via LMA  OPERATIVE PROCEDURE:  1: Incision and drainage right forearm dorsal abscess #2: Incision and drainage right forearm volar abscess  IMPLANTS: None  RADIOGRAPHIC INTERPRETATION: None  SURGICAL INDICATIONS: Patient is a right-hand-dominant gentleman who was taken to the operating room less than 48 hours ago for initial incision and drainage.  Patient was scheduled undergo the above procedure today for repeat I&D given the complexity of the infection.  Signed informed consent was obtained on the day of surgery.  SURGICAL TECHNIQUE: Patient was prepped identified in the preoperative holding area and marked with a permanent marker made on the right forearm to indicate the correct operative site.  Patient was then brought back to operating placed supine on the anesthesia table where the general anesthetic was administered.  Patient tolerated this well.  A well-padded tourniquet placed on the right brachium stay with the appropriate drape.  The right upper extremities then prepped and draped normal sterile fashion.  A timeout was called the correct site was identified the procedure then begun.  Attention then turned to the right forearm.  The previous wound was then opened up.  Excisional debridement of the skin and subcutaneous tissues and muscle and fascia layer was then done sharply with a knife and sharp scissors.  Following this thorough irrigation was then done of done of both areas both the volar and dorsal aspects of the forearm.  2 separate interventions.  After thorough wound irrigation and debridement the skin was then loosely reapproximated with skin staples.  Xeroform dressing was applied.  Sterile compressive bandage then applied.  The patient  tolerated the procedure well was placed in a long-arm posterior splint.  He was extubated taken recovery in good condition.  POSTOPERATIVE PLAN: He will be admitted back to the family practice teaching service.  Continue with the IV antibiotics.  He needs to keep the current splint on the entire time he is in the hospital.  Does not need to be changed.  I can see him in the office on Thursday.  Oral antibiotics based on the cultures.  Follow-up on Thursday for wound check.  Please contact me if there are any questions or issues that arise.

## 2019-02-05 NOTE — Transfer of Care (Signed)
Immediate Anesthesia Transfer of Care Note  Patient: Matthew Carroll  Procedure(s) Performed: Repeat IRRIGATION AND DEBRIDEMENT Right Forearm (Right Arm Lower)  Patient Location: PACU  Anesthesia Type:General  Level of Consciousness: drowsy  Airway & Oxygen Therapy: Patient Spontanous Breathing and Patient connected to face mask oxygen  Post-op Assessment: Report given to RN and Post -op Vital signs reviewed and stable  Post vital signs: Reviewed and stable  Last Vitals:  Vitals Value Taken Time  BP 144/125 02/05/2019  8:29 AM  Temp    Pulse 73 02/05/2019  8:31 AM  Resp 21 02/05/2019  8:31 AM  SpO2 96 % 02/05/2019  8:31 AM  Vitals shown include unvalidated device data.  Last Pain:  Vitals:   02/05/19 0555  TempSrc: Oral  PainSc:       Patients Stated Pain Goal: 2 (02/04/19 0941)  Complications: No apparent anesthesia complications

## 2019-02-05 NOTE — Progress Notes (Signed)
R/B/A DISCUSSED WITH PT IN HOSPITAL.  PT VOICED UNDERSTANDING OF PLAN CONSENT SIGNED DAY OF SURGERY PT SEEN AND EXAMINED PRIOR TO OPERATIVE PROCEDURE/DAY OF SURGERY SITE MARKED. QUESTIONS ANSWERED WILL REMAIN AN INPATIENT  PLAN FOR REPEAT I/D RIGHT FOREARM

## 2019-02-05 NOTE — Progress Notes (Signed)
Patient is stable, ambulatory, just left [facility] and signed the AMA documents. Dr. Primitivo Gauze was made aware and according to the patient he has follow up appointment on Thursday.

## 2019-02-05 NOTE — Progress Notes (Signed)
Family medicine interim progress note  Received page from nurse informing me that patient wanting to leave AMA and requesting oral pain meds and oral antibiotics. Discussed plan of care with patient. Explained that his IV antibiotic regimen needs to be narrowed to oral abx based on culture data. Explained that I have no way of knowing which po antibiotic will be effective and that is not a choice I would be able to make at this time. Also explained that patient just had hand surgery this am and that would like to observe him for a full 24 hours.  Also explained that patient needs solid follow up plan, pain management regimen, and wound care plan. Discussed the risks of the patient leaving which include inadequate pain control, high likelihood of infection recurrence, possible wound dehiscence, and concern regarding social situation. Patient voiced understanding to these risks and explained that he feels like it is "time to go". Explained that since patient is leaving against medical advice I cannot prescribe him any medications. Highly advised patient to make a follow up appointment on Thursday per Dr. Glenna Durand note and he stated that he would be sure he was there. Patient voiced understanding to all of the above and is still going to leave AMA.  Myrene Buddy MD PGY-2 Family Medicine Resident

## 2019-02-05 NOTE — Progress Notes (Signed)
Family Medicine Teaching Service Daily Progress Note Intern Pager: [phone]  Patient name: Matthew Carroll Medical record number: [id number] Date of birth: 06/30/1986 Age: 33 y.o. Gender: male  Primary Care Provider: Lucky Cowboy, MD Consultants: hand surgery  Code Status: full  Pt Overview and Major Events to Date:  4/23 admitted for R forearm abscess, OR I&D  4/25 second I/D by surgery  Assessment and Plan: GEROGE CRISMAN is a 33 y.o. male presenting with right forearm abscess/cellultitis . PMH is significant for anxiety, IV drug use, tobacco use, homelessness  R forearm abscess s/p I&D 4/23. - second I/D today 4/25.   He is afebrile but continues to have leukocytosis to 18.0 4/25. Wound culture showing gram pos cocci and BC showing NG x2d. MRSA PCR positive. Received tdap. -vital per routine -continue vanc (4/23-) and stop cefepime (4/23-4/24) -hand surgery planning for repeat I&D 4/25, NPO at midnight  -transition IV to PO dilaudid 1mg  Q4hr -monitor CBC -neurovascular checks q4h -follow wound cultures  Hyponatremia. Resolved - monitor on daily labs  Mild transaminitis. AST and ALT improved 45>39 and 67>61, respectively. Patient with h/o IVDU. INR 1.1 -hepatitis panel pending -monitor on AM CMP  Anxiety. - continue paxil  Polysubstance use. Reported heroin use, last use 1 week ago. UDS positive for benzodiazepines and cocaine.  -monitor for symptoms of withdrawal  -encourage cessation   Tobacco abuse.  -nicotine patch   FEN/GI: regular diet with NPO at midnight Prophylaxis: SCDs, holding pharmacologic in preparation for repeat surgery    Disposition: pending repeat surgery 4/25  Subjective:  Patient was in surgery early AM during rounds  Objective: Temp:  [98.6 F (37 C)-99.3 F (37.4 C)] 98.9 F (37.2 C) (04/25 0555) Pulse Rate:  [69-84] 69 (04/25 0555) Resp:  [16-18] 18 (04/25 0555) BP: (104-122)/(60-90) 122/72 (04/25 0555) SpO2:  [95 %-99 %] 99 %  (04/25 0555) Physical Exam: Patient was in surgery early AM during rounds  Laboratory: Recent Labs  Lab 02/02/19 2310 02/03/19 0335 02/05/19 0326  WBC 16.5* 17.9* 18.0*  HGB 13.1 13.4 13.2  HCT 38.8* 38.2* 36.9*  PLT 236 241 322   Recent Labs  Lab 02/02/19 2310 02/03/19 0335 02/05/19 0326  NA 133* 138 140  K 3.8 3.8 3.5  CL 97* 103 105  CO2 23 24 25   BUN 12 9 11   CREATININE 0.97 0.93 0.86  CALCIUM 8.3* 8.2* 8.6*  PROT 6.9 6.5  --   BILITOT 1.2 0.9  --   ALKPHOS 46 53  --   ALT 67* 61*  --   AST 45* 39  --   GLUCOSE 107* 105* 117*    HIV: Non Reactive (04/23 0335)  Drugs of Abuse     Component Value Date/Time   LABOPIA NONE DETECTED 02/03/2019 0134   COCAINSCRNUR POSITIVE (A) 02/03/2019 0134   LABBENZ POSITIVE (A) 02/03/2019 0134   AMPHETMU NONE DETECTED 02/03/2019 0134   THCU NONE DETECTED 02/03/2019 0134   LABBARB NONE DETECTED 02/03/2019 0134    MRSA PCR nasal swab positive  COVID neg  Imaging/Diagnostic Tests: No results found.  Marthenia Rolling, DO 02/05/2019, 7:22 AM PGY-2, [facility] FPTS Intern pager: [phone], text pages welcome

## 2019-02-05 NOTE — Anesthesia Procedure Notes (Signed)
Procedure Name: Intubation Date/Time: 02/05/2019 7:42 AM Performed by: Elayne Snare, CRNA Pre-anesthesia Checklist: Patient identified, Emergency Drugs available, Suction available and Patient being monitored Patient Re-evaluated:Patient Re-evaluated prior to induction Oxygen Delivery Method: Circle System Utilized Preoxygenation: Pre-oxygenation with 100% oxygen Induction Type: Rapid sequence and IV induction Laryngoscope Size: Mac and 4 Grade View: Grade I Tube type: Oral Tube size: 7.5 mm Number of attempts: 1 Airway Equipment and Method: Stylet and Oral airway Placement Confirmation: ETT inserted through vocal cords under direct vision,  positive ETCO2 and breath sounds checked- equal and bilateral Secured at: 23 cm Tube secured with: Tape Dental Injury: Teeth and Oropharynx as per pre-operative assessment

## 2019-02-05 NOTE — Anesthesia Postprocedure Evaluation (Signed)
Anesthesia Post Note  Patient: Matthew Carroll  Procedure(s) Performed: Repeat IRRIGATION AND DEBRIDEMENT Right Forearm (Right Arm Lower)     Patient location during evaluation: PACU Anesthesia Type: General Level of consciousness: awake and alert Pain management: pain level controlled Vital Signs Assessment: post-procedure vital signs reviewed and stable Respiratory status: spontaneous breathing, nonlabored ventilation, respiratory function stable and patient connected to nasal cannula oxygen Cardiovascular status: blood pressure returned to baseline and stable Postop Assessment: no apparent nausea or vomiting Anesthetic complications: no    Last Vitals:  Vitals:   02/05/19 0956 02/05/19 1423  BP: 139/77 114/68  Pulse: 61 67  Resp: 16 18  Temp: 36.9 C 37 C  SpO2: 98% 97%    Last Pain:  Vitals:   02/05/19 1423  TempSrc: Oral  PainSc:                  Ryan P Ellender

## 2019-02-05 NOTE — Anesthesia Preprocedure Evaluation (Addendum)
Anesthesia Evaluation  Patient identified by MRN, date of birth, ID band Patient awake    Reviewed: Allergy & Precautions, NPO status , Patient's Chart, lab work & pertinent test results  Airway Mallampati: II  TM Distance: >3 FB Neck ROM: Full    Dental  (+) Poor Dentition   Pulmonary Current Smoker,    Pulmonary exam normal breath sounds clear to auscultation       Cardiovascular negative cardio ROS Normal cardiovascular exam Rhythm:Regular Rate:Normal  ECG: ST, rate 101   Neuro/Psych PSYCHIATRIC DISORDERS Anxiety ADD (attention deficit disorder)negative neurological ROS     GI/Hepatic negative GI ROS, (+)     substance abuse  alcohol use, cocaine use and IV drug use, Hepatitis -  Endo/Other  negative endocrine ROS  Renal/GU negative Renal ROS     Musculoskeletal negative musculoskeletal ROS (+) narcotic dependent  Abdominal   Peds  Hematology  (+) anemia , Hyperlipidemia   Anesthesia Other Findings Right Forearm Abscess  Reproductive/Obstetrics                            Anesthesia Physical Anesthesia Plan  ASA: III  Anesthesia Plan: General   Post-op Pain Management:    Induction: Rapid sequence  PONV Risk Score and Plan: 2 and Ondansetron, Dexamethasone, Midazolam and Treatment may vary due to age or medical condition  Airway Management Planned: Oral ETT  Additional Equipment:   Intra-op Plan:   Post-operative Plan: Extubation in OR  Informed Consent: I have reviewed the patients History and Physical, chart, labs and discussed the procedure including the risks, benefits and alternatives for the proposed anesthesia with the patient or authorized representative who has indicated his/her understanding and acceptance.     Dental advisory given  Plan Discussed with: CRNA  Anesthesia Plan Comments:        Anesthesia Quick Evaluation

## 2019-02-06 ENCOUNTER — Encounter (HOSPITAL_COMMUNITY): Payer: Self-pay | Admitting: Orthopedic Surgery

## 2019-02-07 LAB — CULTURE, BLOOD (ROUTINE X 2)
Culture: NO GROWTH
Culture: NO GROWTH
Special Requests: ADEQUATE

## 2019-02-08 LAB — AEROBIC/ANAEROBIC CULTURE W GRAM STAIN (SURGICAL/DEEP WOUND)

## 2019-02-08 LAB — AEROBIC/ANAEROBIC CULTURE (SURGICAL/DEEP WOUND)

## 2019-02-09 LAB — HEPATITIS C GENOTYPE: HCV Genotype: 3

## 2019-02-09 LAB — HCV RNA QUANT RFLX ULTRA OR GENOTYP
HCV RNA Qnt(log copy/mL): 6.961 log10 IU/mL
HepC Qn: 9140000 IU/mL

## 2019-08-03 ENCOUNTER — Emergency Department (HOSPITAL_COMMUNITY)
Admission: EM | Admit: 2019-08-03 | Discharge: 2019-08-03 | Disposition: A | Discharge status: Home / Self Care | Payer: Self-pay | Attending: Emergency Medicine | Admitting: Emergency Medicine

## 2019-08-03 ENCOUNTER — Encounter (HOSPITAL_COMMUNITY): Payer: Self-pay

## 2019-08-03 ENCOUNTER — Other Ambulatory Visit: Payer: Self-pay

## 2019-08-03 DIAGNOSIS — F909 Attention-deficit hyperactivity disorder, unspecified type: Secondary | ICD-10-CM | POA: Insufficient documentation

## 2019-08-03 DIAGNOSIS — L0291 Cutaneous abscess, unspecified: Secondary | ICD-10-CM

## 2019-08-03 DIAGNOSIS — L03116 Cellulitis of left lower limb: Secondary | ICD-10-CM | POA: Insufficient documentation

## 2019-08-03 DIAGNOSIS — F17210 Nicotine dependence, cigarettes, uncomplicated: Secondary | ICD-10-CM | POA: Insufficient documentation

## 2019-08-03 DIAGNOSIS — L0390 Cellulitis, unspecified: Secondary | ICD-10-CM

## 2019-08-03 DIAGNOSIS — L02416 Cutaneous abscess of left lower limb: Secondary | ICD-10-CM | POA: Insufficient documentation

## 2019-08-03 LAB — CBC WITH DIFFERENTIAL/PLATELET
Abs Immature Granulocytes: 0.05 10*3/uL (ref 0.00–0.07)
Basophils Absolute: 0 10*3/uL (ref 0.0–0.1)
Basophils Relative: 0 %
Eosinophils Absolute: 0.2 10*3/uL (ref 0.0–0.5)
Eosinophils Relative: 1 %
HCT: 37.5 % — ABNORMAL LOW (ref 39.0–52.0)
Hemoglobin: 13.1 g/dL (ref 13.0–17.0)
Immature Granulocytes: 0 %
Lymphocytes Relative: 24 %
Lymphs Abs: 2.9 10*3/uL (ref 0.7–4.0)
MCH: 31.3 pg (ref 26.0–34.0)
MCHC: 34.9 g/dL (ref 30.0–36.0)
MCV: 89.5 fL (ref 80.0–100.0)
Monocytes Absolute: 0.8 10*3/uL (ref 0.1–1.0)
Monocytes Relative: 7 %
Neutro Abs: 8.1 10*3/uL — ABNORMAL HIGH (ref 1.7–7.7)
Neutrophils Relative %: 68 %
Platelets: 306 10*3/uL (ref 150–400)
RBC: 4.19 MIL/uL — ABNORMAL LOW (ref 4.22–5.81)
RDW: 12.7 % (ref 11.5–15.5)
WBC: 12 10*3/uL — ABNORMAL HIGH (ref 4.0–10.5)
nRBC: 0 % (ref 0.0–0.2)

## 2019-08-03 LAB — COMPREHENSIVE METABOLIC PANEL
ALT: 51 U/L — ABNORMAL HIGH (ref 0–44)
AST: 32 U/L (ref 15–41)
Albumin: 3.5 g/dL (ref 3.5–5.0)
Alkaline Phosphatase: 60 U/L (ref 38–126)
Anion gap: 9 (ref 5–15)
BUN: 18 mg/dL (ref 6–20)
CO2: 27 mmol/L (ref 22–32)
Calcium: 8.8 mg/dL — ABNORMAL LOW (ref 8.9–10.3)
Chloride: 101 mmol/L (ref 98–111)
Creatinine, Ser: 1.06 mg/dL (ref 0.61–1.24)
GFR calc Af Amer: >60 mL/min (ref >60)
GFR calc non Af Amer: >60 mL/min (ref >60)
Glucose, Bld: 121 mg/dL — ABNORMAL HIGH (ref 70–99)
Potassium: 4.2 mmol/L (ref 3.5–5.1)
Sodium: 137 mmol/L (ref 135–145)
Total Bilirubin: 0.3 mg/dL (ref 0.3–1.2)
Total Protein: 7.1 g/dL (ref 6.5–8.1)

## 2019-08-03 LAB — URINALYSIS, ROUTINE W REFLEX MICROSCOPIC
Bilirubin Urine: NEGATIVE
Glucose, UA: NEGATIVE mg/dL
Hgb urine dipstick: NEGATIVE
Ketones, ur: NEGATIVE mg/dL
Leukocytes,Ua: NEGATIVE
Nitrite: NEGATIVE
Protein, ur: NEGATIVE mg/dL
Specific Gravity, Urine: 1.029 (ref 1.005–1.030)
pH: 5 (ref 5.0–8.0)

## 2019-08-03 LAB — LACTIC ACID, PLASMA: Lactic Acid, Venous: 0.6 mmol/L (ref 0.5–1.9)

## 2019-08-03 MED ORDER — SULFAMETHOXAZOLE-TRIMETHOPRIM 800-160 MG PO TABS: 1.0000 | ORAL_TABLET | Freq: Two times a day (BID) | ORAL | 0 refills | Status: AC

## 2019-08-03 MED ORDER — SODIUM CHLORIDE 0.9% FLUSH: 3.0000 mL | Freq: Once | INTRAVENOUS | Status: DC

## 2019-08-03 MED ORDER — LIDOCAINE-EPINEPHRINE (PF) 2 %-1:200000 IJ SOLN
20.0000 mL | Freq: Once | INTRAMUSCULAR | Status: DC
  Filled 2019-08-03: qty 20

## 2019-08-03 MED ORDER — SULFAMETHOXAZOLE-TRIMETHOPRIM 800-160 MG PO TABS
1.0000 | ORAL_TABLET | Freq: Once | ORAL | Status: AC
  Administered 2019-08-03: 06:00:00 1 via ORAL
  Filled 2019-08-03: qty 1

## 2019-08-03 MED ORDER — OXYCODONE-ACETAMINOPHEN 5-325 MG PO TABS
1.0000 | ORAL_TABLET | Freq: Once | ORAL | Status: AC
  Administered 2019-08-03: 1 via ORAL
  Filled 2019-08-03: qty 1

## 2019-08-03 NOTE — ED Provider Notes (Signed)
Emergency Department Provider Note   I have reviewed the triage vital signs and the nursing notes.   HISTORY  Chief Complaint Abscess   HPI Matthew Carroll is a 33 y.o. male who presents to the emergency department today with a multiday history of left leg swelling and redness and now with a centralized raised fluctuant area.  Patient states that he had abscesses before and this is similar but does not know what happened.  No fevers, nausea, vomiting or systemic symptoms.  No recent IV drug use.   No other associated or modifying symptoms.    Past Medical History:  Diagnosis Date  . ADD (attention deficit disorder)   . Anxiety disorder   . Cellulitis 01/2019   RIGHT UPPER EXTREMITY  . Colitis   . Hepatitis   . Herpes simplex   . Hyperlipidemia   . Vitamin D deficiency     Patient Active Problem List   Diagnosis Date Noted  . Tobacco use disorder   . Sepsis ([place]) 02/03/2019  . Abscess of arm 02/03/2019  . Heroin abuse ([place]) 02/03/2019  . IVDA (intravenous drug abuse) complicating pregnancy ([place]) 02/03/2019  . Polysubstance abuse ([place]) 02/03/2019  . Cellulitis of right upper extremity   . 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 Alcohol abuse 01/29/2015  . ADD (attention deficit disorder)   . Hyperlipidemia   . Vitamin D deficiency   . Ingrown toenail 05/13/2011  . Anxiety 05/06/2011  . Diarrhea 08/01/2010  . Chronic abdominal pain 08/01/2010    Past Surgical History:  Procedure Laterality Date  . I&D EXTREMITY Right 02/03/2019   Procedure: IRRIGATION AND DEBRIDEMENT FOREARM;  Surgeon: Iran Planas, MD;  Location: [place];  Service: Orthopedics;  Laterality: Right;  . I&D EXTREMITY Right 02/05/2019   Procedure: Repeat IRRIGATION AND DEBRIDEMENT Right Forearm;  Surgeon: Iran Planas, MD;  Location: [place];  Service: Orthopedics;  Laterality: Right;  . TENDON REPAIR     thumb    Current Outpatient Rx  . Order #:  [id number] Class: Print    Allergies Patient has no known allergies.  Family History  Problem Relation Age of Onset  . Diabetes Father   . Colon cancer Neg Hx     Social History Social History   Tobacco Use  . Smoking status: Current Every Day Smoker    Packs/day: 0.50    Years: 8.00    Pack years: 4.00    Types: Cigarettes  . Smokeless tobacco: Never Used  Substance Use Topics  . Alcohol use: Yes  . Drug use: Yes    Types: IV, Cocaine    Review of Systems  All other systems negative except as documented in the HPI. All pertinent positives and negatives as reviewed in the HPI. ____________________________________________   PHYSICAL EXAM:  VITAL SIGNS: ED Triage Vitals  Enc Vitals Group     BP 08/03/19 0258 120/69     Pulse Rate 08/03/19 0258 88     Resp 08/03/19 0258 19     Temp 08/03/19 0258 98.9 F (37.2 C)     Temp Source 08/03/19 0258 Oral     SpO2 08/03/19 0258 100 %     Weight --      Height --      Head Circumference --      Peak Flow --      Pain Score 08/03/19 0259 7     Pain Loc --      Pain  Edu? --      Excl. in GC? --     Constitutional: Alert and oriented. Well appearing and in no acute distress. Eyes: Conjunctivae are normal. PERRL. EOMI. Head: Atraumatic. Nose: No congestion/rhinnorhea. Mouth/Throat: Mucous membranes are moist.  Oropharynx non-erythematous. Neck: No stridor.  No meningeal signs.   Cardiovascular: Normal rate, regular rhythm. Good peripheral circulation. Grossly normal heart sounds.   Respiratory: Normal respiratory effort.  No retractions. Lungs CTAB. Gastrointestinal: Soft and nontender. No distention.  Musculoskeletal: No lower extremity tenderness nor edema. No gross deformities of extremities. Neurologic:  Normal speech and language. No gross focal neurologic deficits are appreciated.  Skin: 3x3 cm abscess to left calf with surrounding edema/cellulitis.   ____________________________________________   LABS (all  labs ordered are listed, but only abnormal results are displayed)  Labs Reviewed  COMPREHENSIVE METABOLIC PANEL - Abnormal; Notable for the following components:      Result Value   Glucose, Bld 121 (*)    Calcium 8.8 (*)    ALT 51 (*)    All other components within normal limits  CBC WITH DIFFERENTIAL/PLATELET - Abnormal; Notable for the following components:   WBC 12.0 (*)    RBC 4.19 (*)    HCT 37.5 (*)    Neutro Abs 8.1 (*)    All other components within normal limits  URINALYSIS, ROUTINE W REFLEX MICROSCOPIC - Abnormal; Notable for the following components:   APPearance HAZY (*)    All other components within normal limits  LACTIC ACID, PLASMA  LACTIC ACID, PLASMA   ____________________________________________  PROCEDURES  Procedure(s) performed:   Marland Kitchen.Marland Kitchen.Incision and Drainage  Date/Time: 08/03/2019 7:12 AM Performed by: Marily MemosMesner, Herchel Hopkin, MD Authorized by: Marily MemosMesner, Saydi Kobel, MD   Consent:    Consent obtained:  Verbal   Consent given by:  Patient   Risks discussed:  Incomplete drainage, infection, pain, damage to other organs and bleeding   Alternatives discussed:  No treatment Location:    Type:  Abscess   Size:  3x3 cm   Location:  Lower extremity   Lower extremity location:  Leg   Leg location:  L lower leg Pre-procedure details:    Skin preparation:  Betadine Anesthesia (see MAR for exact dosages):    Anesthesia method:  Local infiltration   Local anesthetic:  Lidocaine 1% WITH epi Procedure type:    Complexity:  Simple Procedure details:    Needle aspiration: no     Incision types:  Single with marsupialization   Scalpel blade:  11   Wound management:  Probed and deloculated, irrigated with saline, extensive cleaning and debrided   Drainage:  Bloody and purulent   Drainage amount:  Moderate   Packing materials:  None Post-procedure details:    Patient tolerance of procedure:  Tolerated well, no immediate complications      ____________________________________________   INITIAL IMPRESSION / ASSESSMENT AND PLAN / ED COURSE  Abscess/cellulitis. I&D as above. abx here and home. Return precautions discussed.      Pertinent labs & imaging results that were available during my care of the patient were reviewed by me and considered in my medical decision making (see chart for details).  A medical screening exam was performed and I feel the patient has had an appropriate workup for their chief complaint at this time and likelihood of emergent condition existing is low. They have been counseled on decision, discharge, follow up and which symptoms necessitate immediate return to the emergency department. They or their family verbally stated understanding  and agreement with plan and discharged in stable condition.   ____________________________________________  FINAL CLINICAL IMPRESSION(S) / ED DIAGNOSES  Final diagnoses:  Abscess  Cellulitis, unspecified cellulitis site     MEDICATIONS GIVEN DURING THIS VISIT:  Medications  sodium chloride flush (NS) 0.9 % injection 3 mL (has no administration in time range)  lidocaine-EPINEPHrine (XYLOCAINE W/EPI) 2 %-1:200000 (PF) injection 20 mL (has no administration in time range)  oxyCODONE-acetaminophen (PERCOCET/ROXICET) 5-325 MG per tablet 1 tablet (1 tablet Oral Given 08/03/19 0606)  sulfamethoxazole-trimethoprim (BACTRIM DS) 800-160 MG per tablet 1 tablet (1 tablet Oral Given 08/03/19 0609)     NEW OUTPATIENT MEDICATIONS STARTED DURING THIS VISIT:  Discharge Medication List as of 08/03/2019  6:45 AM    START taking these medications   Details  sulfamethoxazole-trimethoprim (BACTRIM DS) 800-160 MG tablet Take 1 tablet by mouth 2 (two) times daily for 10 days., Starting Wed 08/03/2019, Until Sat 08/13/2019, Print        Note:  This note was prepared with assistance of Dragon voice recognition software. Occasional wrong-word or sound-a-like substitutions may  have occurred due to the inherent limitations of voice recognition software.   Marily Memos, MD 08/03/19 [phone]

## 2019-08-03 NOTE — ED Triage Notes (Signed)
Pt has large abscess to left calf, hardened, reddened around area, center is bloody and dark in color. Pt denies fever, chills.

## 2019-08-03 NOTE — ED Notes (Signed)
Reviewed discharge instructions with patient. Pt verbalized understanding. 

## 2019-11-19 ENCOUNTER — Encounter (HOSPITAL_COMMUNITY): Payer: Self-pay | Admitting: *Deleted

## 2019-11-19 ENCOUNTER — Emergency Department (HOSPITAL_COMMUNITY)
Admission: EM | Admit: 2019-11-19 | Discharge: 2019-11-19 | Disposition: A | Discharge status: Home / Self Care | Payer: Self-pay | Attending: Emergency Medicine | Admitting: Emergency Medicine

## 2019-11-19 ENCOUNTER — Other Ambulatory Visit: Payer: Self-pay

## 2019-11-19 DIAGNOSIS — L03113 Cellulitis of right upper limb: Secondary | ICD-10-CM | POA: Insufficient documentation

## 2019-11-19 DIAGNOSIS — L0291 Cutaneous abscess, unspecified: Secondary | ICD-10-CM

## 2019-11-19 DIAGNOSIS — L02413 Cutaneous abscess of right upper limb: Secondary | ICD-10-CM | POA: Insufficient documentation

## 2019-11-19 MED ORDER — CLINDAMYCIN HCL 300 MG PO CAPS: 300.0000 mg | ORAL_CAPSULE | Freq: Four times a day (QID) | ORAL | 0 refills | Status: AC

## 2019-11-19 MED ORDER — NALOXONE HCL 4 MG/0.1ML NA LIQD: 1.0000 | Freq: Once | NASAL | 0 refills | Status: AC

## 2019-11-19 MED ORDER — LIDOCAINE HCL 2 % IJ SOLN
20.0000 mL | Freq: Once | INTRAMUSCULAR | Status: AC
  Administered 2019-11-19: 04:00:00 400 mg
  Filled 2019-11-19: qty 20

## 2019-11-19 NOTE — ED Provider Notes (Signed)
[facility]EM[facility]PT [facility] Emergency Department Provider Note MRN:  [id number]  Arrival date & time: 11/19/19     Chief Complaint   Abscess   History of Present Illness   Matthew Carroll is a 34 y.o. year-old male with a history of IV drug use presenting to the ED with chief complaint of abscess.  Location: Right wrist Duration: 3 days Onset: Gradual Timing: Constant Description: Large painful bump Severity: Moderate Exacerbating/Alleviating Factors: Pain worse with palpation Associated Symptoms: None Pertinent Negatives: Denies fever, no chest pain or shortness of breath, no abdominal pain, no other complaints.   Review of Systems  A complete 10 system review of systems was obtained and all systems are negative except as noted in the HPI and PMH.   Patient's Health History    Past Medical History:  Diagnosis Date  . ADD (attention deficit disorder)   . Anxiety disorder   . Cellulitis 01/2019   RIGHT UPPER EXTREMITY  . Colitis   . Hepatitis   . Herpes simplex   . Hyperlipidemia   . Vitamin D deficiency     Past Surgical History:  Procedure Laterality Date  . I & D EXTREMITY Right 02/03/2019   Procedure: IRRIGATION AND DEBRIDEMENT FOREARM;  Surgeon: Bradly Bienenstock, MD;  Location: [facility] OR;  Service: Orthopedics;  Laterality: Right;  . I & D EXTREMITY Right 02/05/2019   Procedure: Repeat IRRIGATION AND DEBRIDEMENT Right Forearm;  Surgeon: Bradly Bienenstock, MD;  Location: [facility] OR;  Service: Orthopedics;  Laterality: Right;  . TENDON REPAIR     thumb    Family History  Problem Relation Age of Onset  . Diabetes Father   . Colon cancer Neg Hx     Social History   Socioeconomic History  . Marital status: Married    Spouse name: Not on file  . Number of children: 1  . Years of education: Not on file  . Highest education level: Not on file  Occupational History  . Occupation: Psychiatrist: [employer]  Tobacco Use  . Smoking status: Current Every  Day Smoker    Packs/day: 0.50    Years: 8.00    Pack years: 4.00    Types: Cigarettes  . Smokeless tobacco: Never Used  Substance and Sexual Activity  . Alcohol use: Yes  . Drug use: Yes    Types: IV, Cocaine  . Sexual activity: Not on file  Other Topics Concern  . Not on file  Social History Narrative   Daily caffeine use 4/day.   Social Determinants of Health   Financial Resource Strain:   . Difficulty of Paying Living Expenses: Not on file  Food Insecurity:   . Worried About Programme researcher, broadcasting/film/video in the Last Year: Not on file  . Ran Out of Food in the Last Year: Not on file  Transportation Needs:   . Lack of Transportation (Medical): Not on file  . Lack of Transportation (Non-Medical): Not on file  Physical Activity:   . Days of Exercise per Week: Not on file  . Minutes of Exercise per Session: Not on file  Stress:   . Feeling of Stress : Not on file  Social Connections:   . Frequency of Communication with Friends and Family: Not on file  . Frequency of Social Gatherings with Friends and Family: Not on file  . Attends Religious Services: Not on file  . Active Member of Clubs or Organizations: Not on file  .  Attends Archivist Meetings: Not on file  . Marital Status: Not on file  Intimate Partner Violence:   . Fear of Current or Ex-Partner: Not on file  . Emotionally Abused: Not on file  . Physically Abused: Not on file  . Sexually Abused: Not on file     Physical Exam   Vitals:   11/19/19 0230  BP: 111/73  Pulse: 72  Resp: 16  Temp: 98.4 F (36.9 C)  SpO2: 98%    CONSTITUTIONAL: Well-appearing, NAD NEURO:  Alert and oriented x 3, no focal deficits EYES:  eyes equal and reactive ENT/NECK:  no LAD, no JVD CARDIO: Regular rate, well-perfused, normal S1 and S2 PULM:  CTAB no wheezing or rhonchi GI/GU:  normal bowel sounds, non-distended, non-tender MSK/SPINE:  No gross deformities, no edema SKIN: Large 5 cm abscess to the anterior aspect of the  right wrist PSYCH:  Appropriate speech and behavior  *Additional and/or pertinent findings included in MDM below  Diagnostic and Interventional Summary    EKG Interpretation  Date/Time:    Ventricular Rate:    PR Interval:    QRS Duration:   QT Interval:    QTC Calculation:   R Axis:     Text Interpretation:        Cardiac Monitoring Interpretation:  Labs Reviewed - No data to display  No orders to display    Medications  lidocaine (XYLOCAINE) 2 % (with pres) injection 400 mg (400 mg Other Given 11/19/19 0406)     Procedures  /  Critical Care .Marland KitchenIncision and Drainage  Date/Time: 11/19/2019 4:34 AM Performed by: Maudie Flakes, MD Authorized by: Maudie Flakes, MD   Consent:    Consent obtained:  Verbal   Consent given by:  Patient   Risks discussed:  Bleeding, damage to other organs, incomplete drainage, infection and pain Location:    Type:  Abscess   Size:  5cm   Location:  Upper extremity   Upper extremity location:  Wrist   Wrist location:  R wrist Pre-procedure details:    Procedure prep: Alcohol swab. Anesthesia (see MAR for exact dosages):    Anesthesia method:  Local infiltration   Local anesthetic:  Lidocaine 1% w/o epi Procedure type:    Complexity:  Complex Procedure details:    Incision types:  Single straight   Incision depth:  Dermal   Scalpel blade:  11   Wound management:  Probed and deloculated and irrigated with saline   Drainage:  Purulent   Drainage amount:  Copious   Wound treatment:  Wound left open   Packing materials:  None Post-procedure details:    Patient tolerance of procedure:  Tolerated well, no immediate complications    ED Course and Medical Decision Making  I have reviewed the triage vital signs, the nursing notes, and pertinent available records from the EMR.  Pertinent labs & imaging results that were available during my care of the patient were reviewed by me and considered in my medical decision making (see below  for details).     Abscess of the right wrist associated with IV drug use, normal vital signs, no other symptoms, no murmur on exam, will need I&D.  The abscess is large and is located directly over the radial artery distribution, will ultrasound prior to I&D attempt to ensure no vascular risk.  4:30 AM update: Bedside ultrasound using color Doppler showed that the large abscess did not contain any vascular structures.  Abscess drained as described  above.  Moderate amount of cellulitis surrounding the area, appropriate for discharge on clindamycin.  Elmer Sow. Pilar Plate, MD [facility] Health Emergency Medicine [employer] [facility] [email or web address]  Final Clinical Impressions(s) / ED Diagnoses     ICD-10-CM   1. Abscess  L02.91   2. Cellulitis of right upper extremity  L03.113     ED Discharge Orders         Ordered    clindamycin (CLEOCIN) 300 MG capsule  4 times daily     11/19/19 0432    naloxone (NARCAN) nasal spray 4 mg/0.1 mL   Once     11/19/19 2094           Discharge Instructions Discussed with and Provided to Patient:     Discharge Instructions     You were evaluated in the Emergency Department and after careful evaluation, we did not find any emergent condition requiring admission or further testing in the hospital.  Your exam/testing today is overall reassuring.  Please take the antibiotics as directed for your wrist infection.  We also recommend that you feel the prescription for the Narcan, to be used if you accidentally overdose on heroin.  We have provided you with rehab resources as well.  Please return to the Emergency Department if you experience any worsening of your condition.  We encourage you to follow up with a primary care provider.  Thank you for allowing [place] to be a part of your care.       Sabas Sous, MD 11/19/19 [phone]

## 2019-11-19 NOTE — ED Triage Notes (Signed)
Abscess to the right wrist for 3 days. Unsure of fevers. Pt admits IV drug use to the area about 1 week ago.

## 2019-11-19 NOTE — ED Notes (Signed)
Dr. Pilar Plate at bedside performing I&D

## 2019-11-19 NOTE — Discharge Instructions (Addendum)
You were evaluated in the Emergency Department and after careful evaluation, we did not find any emergent condition requiring admission or further testing in the hospital.  Your exam/testing today is overall reassuring.  Please take the antibiotics as directed for your wrist infection.  We also recommend that you feel the prescription for the Narcan, to be used if you accidentally overdose on heroin.  We have provided you with rehab resources as well.  Please return to the Emergency Department if you experience any worsening of your condition.  We encourage you to follow up with a primary care provider.  Thank you for allowing [place] to be a part of your care.

## 2019-11-19 NOTE — ED Notes (Signed)
Patient verbalized understanding of dc instructions, vss, ambulatory with nad.   

## 2020-03-21 ENCOUNTER — Encounter

## 2020-04-12 ENCOUNTER — Ambulatory Visit: Payer: MEDICAID

## 2020-04-27 ENCOUNTER — Ambulatory Visit: Payer: MEDICAID

## 2020-08-26 DIAGNOSIS — A401 Sepsis due to streptococcus, group B: Secondary | ICD-10-CM

## 2020-08-26 MED ORDER — ACETAMINOPHEN 325 MG TABLET
325 mg | ORAL | Status: AC
Start: 2020-08-26 — End: 2020-08-26
  Administered 2020-08-27: via ORAL

## 2020-08-26 NOTE — ED Notes (Signed)
C/o vomiting for 4 days , c/o hands and feet swelling with body aches

## 2020-08-26 NOTE — ED Provider Notes (Signed)
--_--_--_--_--_--_--_--_--_--_--_--_--_--_--_--_--_--_--_--_--_--_--_--_--_--_--_--_--_--_--_--_--_--_--    6:58 PM: Limited Initial Evaluation Note by Clelia Schaumann, PA    Chief Complaint:  General body aches, malaise and fatigue    HPI:  34 year old male who for the last 4 days has had increasing malaise, fatigue, general body aches.  He has been nauseous but no vomiting.  He has had subjective fevers.    O: 22 a 34 year old male who is seated in the wheelchair and in no apparent distress.  He does appear fatigued.  He is conscious alert and oriented.  Vital signs are all within normal limits.    A:  Malaise and fatigue    P:  EKG, COVID swab, CBC, CMP, magnesium, UA, drug screen and alcohol screen by provider in triage.  Patient also received Tylenol.  Additional evaluation when the patient is in a room.    This note was completed as part of an accelerated patient evaluation process.  It is not intended to be the definitive or final record of the patient's care and should be used in conjunction with the rest of the ED Provider Note.    At this time it has not yet been determined whether or not an emergency medical condition exists.    --_--_--_--_--_--_--_--_--_--_--_--_--_--_--_--_--_--_--_--_--_--_--_--_--_--_--_--_--_--_--_--_--_--_--      Generalized Body Aches  Pertinent negatives include no chest pain and no shortness of breath.   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  Reports everything hurts all over his body, it hurts him to move.  He denies actual fevers, has been coughing.  Says he has had decreased urination.  He denies any nausea vomiting or diarrhea to me.  He denies any chest pain or shortness of breath.  No tick bites or rashes.  He has generalized fatigue.  He denies specific back pain.  Just all over pain.    Past Medical History:   Diagnosis Date   ??? Blind hypertensive left eye     ??? IV drug abuse ([place])    ??? Polysubstance abuse ([place])        History reviewed. No pertinent surgical history.      History reviewed. No pertinent family history.    Social History     Socioeconomic History   ??? Marital status: SINGLE     Spouse name: Not on file   ??? Number of children: Not on file   ??? Years of education: Not on file   ??? Highest education level: Not on file   Occupational History   ??? Not on file   Tobacco Use   ??? Smoking status: Not on file   ??? Smokeless tobacco: Not on file   Substance and Sexual Activity   ??? Alcohol use: Not Currently   ??? Drug use: Yes     Types: Heroin, Methamphetamines, IV   ??? Sexual activity: Not on file   Other Topics Concern   ??? Not on file   Social History Narrative   ??? Not on file     Social Determinants of Health     Financial Resource Strain:    ??? Difficulty of Paying Living Expenses: Not on file   Food Insecurity:    ??? Worried About Running Out of Food in the Last Year: Not on file   ??? Ran Out of Food in the Last Year: Not on file   Transportation Needs:    ??? Lack  of Transportation (Medical): Not on file   ??? Lack of Transportation (Non-Medical): Not on file   Physical Activity:    ??? Days of Exercise per Week: Not on file   ??? Minutes of Exercise per Session: Not on file   Stress:    ??? Feeling of Stress : Not on file   Social Connections:    ??? Frequency of Communication with Friends and Family: Not on file   ??? Frequency of Social Gatherings with Friends and Family: Not on file   ??? Attends Religious Services: Not on file   ??? Active Member of Clubs or Organizations: Not on file   ??? Attends Archivist Meetings: Not on file   ??? Marital Status: Not on file   Intimate Partner Violence:    ??? Fear of Current or Ex-Partner: Not on file   ??? Emotionally Abused: Not on file   ??? Physically Abused: Not on file   ??? Sexually Abused: Not on file   Housing Stability:    ??? Unable to Pay for Housing in the Last Year: Not on file   ??? Number of Places Lived in the Last Year: Not on  file   ??? Unstable Housing in the Last Year: Not on file         ALLERGIES: Patient has no known allergies.    Review of Systems   Constitutional: Positive for appetite change and fatigue. Negative for fever.        Please see HPI for details of review of systems.  Otherwise all review of systems negative.   HENT: Negative.    Respiratory: Positive for cough. Negative for shortness of breath.    Cardiovascular: Negative for chest pain.   Gastrointestinal: Negative.    Genitourinary: Positive for decreased urine volume.   Musculoskeletal: Positive for myalgias.       Vitals:    08/26/20 1852 08/26/20 2317   BP: 102/67    Pulse: 91    Resp: 18    Temp: 99 ??F (37.2 ??C) 99.1 ??F (37.3 ??C)   SpO2: 98%    Weight: 68 kg (150 lb)    Height: 5' 9" (1.753 m)             Physical Exam  Vitals and nursing note reviewed.   Constitutional:       General: He is not in acute distress.     Appearance: He is normal weight. He is not ill-appearing, toxic-appearing or diaphoretic.      Comments: Tired appearing gentleman in no acute distress.  Patient moves very slowly, appears to be in pain when he moves.   HENT:      Head: Normocephalic and atraumatic.      Nose: No rhinorrhea.      Mouth/Throat:      Mouth: Mucous membranes are dry.   Eyes:      Pupils: Pupils are equal, round, and reactive to light.   Cardiovascular:      Rate and Rhythm: Normal rate and regular rhythm.   Pulmonary:      Effort: Pulmonary effort is normal.      Breath sounds: Normal breath sounds.   Abdominal:      General: Bowel sounds are normal.      Palpations: Abdomen is soft.      Tenderness: There is abdominal tenderness (Diffuse tenderness to palpation, especially in the right upper quadrant). There is guarding. There is no right CVA tenderness, left CVA tenderness or  rebound.   Musculoskeletal:      Cervical back: Neck supple. No rigidity.      Right lower leg: No edema.      Left lower leg: No edema.   Lymphadenopathy:      Cervical: No cervical adenopathy.    Skin:     Findings: No rash.      Comments: Mild diffuse erythema bilateral hands.   Neurological:      General: No focal deficit present.      Mental Status: He is oriented to person, place, and time.   Psychiatric:         Mood and Affect: Mood normal.          MDM  Number of Diagnoses or Management Options  Diagnosis management comments: Patient presents with what appears to be some type of infective process.  Although his initial temperature was 99.0??, he feels very warm to the touch to me.  His white count is 16.9, he has toxic granules.  He is also thrombocytopenic with a platelet count 69. Last labs from [facility] in July showed normal white count, normal platelet count.  Chemistries, he has a sodium of only 124, and 3 months ago his sodium was 138 at [facility].  Chloride low at 86, calcium very low at 7.6 with an associated low albumin of 2.6.  Mildly elevated LFTs.  Urinalysis is 0-3 red cells with 0 white cells so does not appear infected, his COVID was negative.  Urine drug screen positive for both amphetamines and marijuana.  CK is normal, TSH normal, alcohol level negative.    I have added on more of a septic workup, getting a chest x-ray, and getting a CT of his abdomen and pelvis given his abdominal tenderness and elevated LFTs and white count.  I have ordered 30 cc/kilos of IV fluids in case he is septic.  Written for blood cultures, lactate, sed rate, CRP.  I have also sent off Lyme and tick panel testing due to his thrombocytopenia and elevated LFTs.  I have asked for repeat temperature.  I am currently going off shift, Dr. Bobbye Charleston is coming on for me, please see his note for results of patient's workup and his ultimate disposition.         Amount and/or Complexity of Data Reviewed  Clinical lab tests: ordered and reviewed  Tests in the radiology section of CPT??: ordered and reviewed  Decide to obtain previous medical records or to obtain history from someone other than the patient:  yes  Review and summarize past medical records: yes  Independent visualization of images, tracings, or specimens: yes      ED Course as of 08/27/20 0418   Sun Aug 26, 2020   2342 Received patient at change of shift this is a homeless active IV drug user the presents with generalized weakness he has had 4 days of increasing malaise fatigue diffuse abdominal pain he has a significantly elevated white count he has hyponatremia at 1:24 a.m. which is new compared to previous platelet count 69 he has a chest x-ray pending CT abdomen and pelvis pending tick-borne panel is been ordered blood cultures x3 to evaluate for endocarditis.  With his fever white count hyponatremia the patient will need to be admitted he will need coverage for endocarditis.  His COVID testing was negative urinalysis was negative [JB]   2343 He has had is fluid bolus written for.  Will cover with cefepime [JB]   2345 With [  JB]   Mon Aug 27, 2020   0086 Patient is refusing IV for CT with contrast we are going to additionally add vancomycin to cover for endocarditis [JB]   0139 Patient is requesting something for pain.  He is agitated we will give him Toradol and droperidol his QTC was okay [JB]   0251 Patient was re-evaluated he had been taking a nap.  He states his abdominal pain is better but he is still having pain in the hands and feet bilaterally.  He is still refusing to have an IV placed so that we can give him contrast for a CT scan.  His inflammatory markers are quite elevated with a CRP and a sed rate quite elevated his white count elevated at 16. He is a chronic injection drug user he does have track marks in his hands and ago per extremities he has warmth and swelling of the hands this could be consistent with infection cellulitic change although it is minimal he has pain and tenderness over both feet not specifically bony.  He does not have an obvious appreciable heart murmur his lungs are clear to auscultation his abdomen repeat exam by  myself he is soft he is sort of a vague diffuse tenderness but certainly nothing that is consistent with acute infection.  It is a very benign abdominal exam.  He will need admission for IV antibiotics and rule out for endocarditis with an echocardiogram [JB]   [phone] Patient refused IV contrast for his CT scan we will just CT his belly without contrast just to rule out any large site of infection [JB]   0417 Patient's CT did not demonstrate any acute intra-abdominal findings again was limited by lack of IV contrast they did note a 5 mm soft tissue nodule in the right middle lobe and recommended follow-up with CT at 12/20/2022 months patient is being admitted to the medicine service [JB]      ED Course User Index  [JB] Kieth Brightly, DO       Procedures    Labs Reviewed   CBC WITH AUTOMATED DIFF - Abnormal; Notable for the following components:       Result Value    WBC 16.2 (*)     HGB 13.4 (*)     HCT 37.8 (*)     PLATELET 69 (*)     ABS. MONOCYTES 1.2 (*)     ABS. NEUTROPHILS 12.8 (*)     ABS. IMM. GRANS. 0.6 (*)     All other components within normal limits   METABOLIC PANEL, COMPREHENSIVE - Abnormal; Notable for the following components:    Sodium 124 (*)     Chloride 86 (*)     Glucose 121 (*)     Calcium 7.6 (*)     ALT (SGPT) 86 (*)     AST (SGOT) 106 (*)     Alk. phosphatase 192 (*)     Albumin 2.6 (*)     All other components within normal limits   UA WITH REFLEX MICRO AND CULTURE - Abnormal; Notable for the following components:    Protein TRACE (*)     Glucose 100 (*)     Blood MODERATE (*)     All other components within normal limits   DRUG SCREEN, URINE - Abnormal; Notable for the following components:    THC (TH-CANNABINOL) Positive (*)     AMPHETAMINES Positive (*)     All other components within normal limits  SED RATE, AUTOMATED - Abnormal; Notable for the following components:    Sed rate, automated 61 (*)     All other components within normal limits   C REACTIVE PROTEIN, QT - Abnormal;  Notable for the following components:    C-Reactive protein 27.40 (*)     All other components within normal limits   COVID-19,INFLUENZA A/B,RSV PANEL   CULTURE, BLOOD   CULTURE, BLOOD,  2ND DRAW   CULTURE, BLOOD 3RD SET   MAGNESIUM   ETHYL ALCOHOL   TSH CASCADE   URINE MICROSCOPIC WITH REFLEX CULTURE   CK   LACTIC ACID W/REFLEX   TICK-BORNE DNA PANEL   LYME DISEASE SEROLOGY EVALUATION(ELISA)       Imaging Results:  CT ABD PELV WO CONT    Result Date: 08/27/2020  1. Large amount of stool in the colon. No obstruction. No free air. 2. There is a 5 mm soft tissue nodule in the right middle lobe. Heart size is normal. No pericardial effusion. Fleischner Society Lung Nodule Screening Recommendations: Low Risk Patients: 8 mm: CT at 3, 9, and 24 months or dynamic contrast-enhanced CT, PET, and/or biopsy. High Risk Patients: 8 mm: CT at 3, 9, and 24 months or dynamic contrast-enhanced CT, PET, and/or biopsy. --NOTIFY-- Low risk: Minimal/absent smoking history and other risk factors High risk: Significant smoking and/or other risk factors Note: Guidelines for followup of solid solitary or multiple nodules detected incidentally at CT (newly detected indeterminate nodule in persons at or greater than 62 years old). If multiple nodules present, size of largest nodule determines followup. Nonsolid or partially solid nodules may require longer followup to exclude more indolent adenocarcinoma. Florene Glen, MA, AJR:[id number], May 2011 THIS IS AN ELECTRONICALLY VERIFIED REPORT Dictated By:  Pauline Good Dictated:  08/27/2020 03:51:55 Transcribed By:  Self-Edited - PowerScribe Transcribed:  08/27/2020 03:51:55 Signed By:  Pauline Good Signed:  08/27/2020 03:55:24 Providers for questions regarding this report, Monday ??? Friday 8am-5pm call [facility] Support at [phone]. From 5pm-7am everyday call [facility] Reading Room at [phone]. WSN: [id number]      Medications given in the ED:  Medications   lactated Ringers infusion  2,040 mL (2,040 mL IntraVENous New Bag 08/26/20 2331)   acetaminophen (TYLENOL) tablet 650 mg (650 mg Oral Given 08/26/20 1907)   cefepime (MAXIPIME) 1 g in 0.9% sodium chloride (MBP/ADV) 50 mL MBP (0 g IntraVENous IV Completed 08/27/20 0047)   vancomycin (VANCOCIN) 1,500 mg in 0.9% sodium chloride 300 mL IVPB (1,500 mg IntraVENous New Bag 08/27/20 0138)   droperidoL (INAPSINE) injection 5 mg (5 mg IntraVENous Given 08/27/20 0145)   ketorolac (TORADOL) injection 15 mg (15 mg IntraVENous Given 08/27/20 0145)       DIAGNOSIS:  1. Drug abuse, IV ([place])    2. Cellulitis of left upper extremity    3. Hypotension, unspecified hypotension type    4. Weakness    5. Pulmonary nodule, right        There are no discharge medications for this patient.      Decision to Admit  admit IV drug abuser likely with sepsis will need to rule out for endocarditis     FOLLOW-UP:  No follow-up provider specified.    Please note that portions of this document were created using the M*Modal Fluency Direct dictation system.  Any inconsistencies or typographical errors may be the result of mis-transcription and should be addressed with the document creator.

## 2020-08-26 NOTE — ED Notes (Signed)
Patient ambulatory to ER 5.

## 2020-08-27 ENCOUNTER — Inpatient Hospital Stay: Admit: 2020-08-27 | Payer: MEDICAID

## 2020-08-27 ENCOUNTER — Inpatient Hospital Stay
Admit: 2020-08-27 | Discharge: 2020-09-05 | Discharge status: Against Medical Advice | Payer: MEDICAID | Attending: Internal Medicine | Admitting: Internal Medicine

## 2020-08-27 ENCOUNTER — Emergency Department: Admit: 2020-08-27 | Payer: MEDICAID

## 2020-08-27 LAB — CK
CK: 71 U/L (ref 50–350)
Total CK: 71 U/L (ref 50–350)

## 2020-08-27 LAB — COMPREHENSIVE METABOLIC PANEL
ALT: 86 U/L — ABNORMAL HIGH (ref 3–35)
ALT: 68 U/L — ABNORMAL HIGH (ref 3–35)
AST: 106 U/L — ABNORMAL HIGH (ref 15–40)
AST: 78 U/L — ABNORMAL HIGH (ref 15–40)
Albumin/Globulin Ratio: 0.7
Albumin/Globulin Ratio: 0.6
Albumin: 2.6 g/dL — ABNORMAL LOW (ref 3.5–5.0)
Albumin: 2.3 g/dL — ABNORMAL LOW (ref 3.5–5.0)
Alkaline Phosphatase: 192 U/L — ABNORMAL HIGH (ref 35–100)
Alkaline Phosphatase: 195 U/L — ABNORMAL HIGH (ref 35–100)
Anion Gap: 15 mmol/L
Anion Gap: 15 mmol/L
BUN: 19 MG/DL (ref 7–20)
BUN: 14 MG/DL (ref 7–20)
Bun/Cre Ratio: 21 NA
Bun/Cre Ratio: 17 NA
CO2: 27 mmol/L (ref 20–32)
CO2: 27 mmol/L (ref 20–32)
Calcium: 7.6 MG/DL — ABNORMAL LOW (ref 8.8–10.5)
Calcium: 7.4 MG/DL — ABNORMAL LOW (ref 8.8–10.5)
Chloride: 86 mmol/L — ABNORMAL LOW (ref 100–110)
Chloride: 92 mmol/L — ABNORMAL LOW (ref 100–110)
Creatinine: 0.91 MG/DL (ref 0.40–1.20)
Creatinine: 0.81 MG/DL (ref 0.40–1.20)
EGFR IF NonAfrican American: >60 mL/min/{1.73_m2} (ref >60)
EGFR IF NonAfrican American: >60 mL/min/{1.73_m2} (ref >60)
GFR African American: >60 mL/min/{1.73_m2} (ref >60)
GFR African American: >60 mL/min/{1.73_m2} (ref >60)
Globulin: 4 g/dL
Globulin: 3.6 g/dL
Glucose: 121 mg/dL — ABNORMAL HIGH (ref 75–110)
Glucose: 143 mg/dL — ABNORMAL HIGH (ref 75–110)
Potassium: 3.5 mmol/L (ref 3.5–5.0)
Potassium: 3.6 mmol/L (ref 3.5–5.0)
Sodium: 124 mmol/L — ABNORMAL LOW (ref 135–145)
Sodium: 130 mmol/L — ABNORMAL LOW (ref 135–145)
Total Bilirubin: 1 mg/dL (ref 0.10–1.20)
Total Bilirubin: 0.6 mg/dL (ref 0.10–1.20)
Total Protein: 6.6 g/dL (ref 6.2–8.0)
Total Protein: 5.9 g/dL — ABNORMAL LOW (ref 6.2–8.0)

## 2020-08-27 LAB — CBC WITH AUTO DIFFERENTIAL
Basophils %: 0 %
Basophils %: 0 %
Basophils Absolute: 0.1 10*3/uL (ref 0.0–0.2)
Basophils Absolute: 0 10*3/uL (ref 0.0–0.2)
Eosinophils %: 0 %
Eosinophils %: 0 %
Eosinophils Absolute: 0 10*3/uL (ref 0.0–0.5)
Eosinophils Absolute: 0 10*3/uL (ref 0.0–0.5)
Granulocyte Absolute Count: 0.6 10*3/uL — ABNORMAL HIGH (ref 0.0–0.1)
Granulocyte Absolute Count: 0.5 10*3/uL — ABNORMAL HIGH (ref 0.0–0.1)
Hematocrit: 37.8 % — ABNORMAL LOW (ref 42.0–52.0)
Hematocrit: 35.8 % — ABNORMAL LOW (ref 42.0–52.0)
Hemoglobin: 13.4 g/dL — ABNORMAL LOW (ref 14.0–18.0)
Hemoglobin: 12.6 g/dL — ABNORMAL LOW (ref 14.0–18.0)
Immature Granulocytes: 4 %
Immature Granulocytes: 3 %
Lymphocytes %: 9 %
Lymphocytes %: 11 %
Lymphocytes Absolute: 1.4 10*3/uL (ref 1.0–4.5)
Lymphocytes Absolute: 1.9 10*3/uL (ref 1.0–4.5)
MCH: 29.1 PG (ref 28.0–34.0)
MCH: 29.2 PG (ref 28.0–34.0)
MCHC: 35.4 g/dL (ref 32.0–36.0)
MCHC: 35.2 g/dL (ref 32.0–36.0)
MCV: 82.2 FL (ref 80.0–100.0)
MCV: 83.1 FL (ref 80.0–100.0)
MPV: 11.8 FL (ref 7.0–12.0)
MPV: 12.1 FL — ABNORMAL HIGH (ref 7.0–12.0)
Monocytes %: 8 %
Monocytes %: 10 %
Monocytes Absolute: 1.2 10*3/uL — ABNORMAL HIGH (ref 0.1–0.8)
Monocytes Absolute: 1.6 10*3/uL — ABNORMAL HIGH (ref 0.1–0.8)
Neutrophils %: 79 %
Neutrophils %: 76 %
Neutrophils Absolute: 12.8 10*3/uL — ABNORMAL HIGH (ref 1.9–7.8)
Neutrophils Absolute: 12.9 10*3/uL — ABNORMAL HIGH (ref 1.9–7.8)
Nucleated RBCs: 0 PER 100 WBC (ref 0.0–1.0)
Nucleated RBCs: 0 PER 100 WBC (ref 0.0–1.0)
Platelets: 69 10*3/uL — ABNORMAL LOW (ref 150–400)
Platelets: 78 10*3/uL — ABNORMAL LOW (ref 150–400)
RBC: 4.6 M/uL (ref 4.50–6.00)
RBC: 4.31 M/uL — ABNORMAL LOW (ref 4.50–6.00)
RDW: 13.2 % (ref 11.5–13.5)
RDW: 13.3 % (ref 11.5–13.5)
WBC: 16.2 10*3/uL — ABNORMAL HIGH (ref 4.8–10.8)
WBC: 17 10*3/uL — ABNORMAL HIGH (ref 4.8–10.8)

## 2020-08-27 LAB — LACTIC ACID, REFLEX: Lactic Acid W/ Reflex: 1.5 MMOL/L (ref 0.5–2.0)

## 2020-08-27 LAB — URINALYSIS WITH REFLEX TO CULTURE
Bilirubin, Urine: NEGATIVE
Glucose, Ur: 100 mg/dL — AB
Ketones, Urine: NEGATIVE mg/dL
Leukocyte Esterase, Urine: NEGATIVE
Nitrite, Urine: NEGATIVE
Specific Gravity, UA: 1.015 NA (ref 1.005–1.030)
Urobilinogen, UA, POCT: >=8 EU/dL (ref 0.1–1.0)
pH, UA: 6.5 NA (ref 5.0–9.0)

## 2020-08-27 LAB — DRUG SCREEN, URINE
AMPHETAMINES: POSITIVE — AB
Amphetamine Screen, Urine: POSITIVE — AB
BARBITURATES: NOT DETECTED
BENZODIAZEPINES: NOT DETECTED
Barbiturate Screen, Urine: NOT DETECTED
Benzodiazepine Screen, Urine: NOT DETECTED
COCAINE: NOT DETECTED
Cocaine Screen Urine: NOT DETECTED
METHADONE: NOT DETECTED
Methadone Screen, Urine: NOT DETECTED
OPIATES: NOT DETECTED
OXYCODONE: NOT DETECTED
Opiate Screen, Urine: NOT DETECTED
Oxycodone: NOT DETECTED
THC (TH-CANNABINOL): POSITIVE — AB
THC Screen, Urine: POSITIVE — AB

## 2020-08-27 LAB — URINE MICROSCOPIC WITH REFLEX CULTURE
WBC, UA: NONE SEEN /hpf (ref <6)
WBC: NONE SEEN /hpf (ref <6)

## 2020-08-27 LAB — THYROID CASCADE PROFILE: TSH, 3RD GENERATION: 0.813 u[IU]/mL (ref 0.340–5.600)

## 2020-08-27 LAB — COVID-19 & INFLUENZA COMBO
RSV By PCR: NEGATIVE
Rapid Influenza A By PCR: NEGATIVE
Rapid Influenza B By PCR: NEGATIVE
SARS-CoV-2: NEGATIVE

## 2020-08-27 LAB — OSMOLALITY, SERUM/PLASMA
Osmolality, serum/plasma: 277 MOSM/kg H2O — ABNORMAL LOW (ref 280–300)
Osmolality: 277 MOSM/kg H2O — ABNORMAL LOW (ref 280–300)

## 2020-08-27 LAB — ETHYL ALCOHOL
ETHYL ALCOHOL: NOT DETECTED MG/DL (ref <5.0)
Ethyl Alcohol: NOT DETECTED MG/DL (ref <5.0)

## 2020-08-27 LAB — MAGNESIUM
Magnesium: 1.9 mg/dL (ref 1.7–2.5)
Magnesium: 1.9 mg/dL (ref 1.7–2.5)
Magnesium: 1.9 mg/dL (ref 1.7–2.5)
Magnesium: 1.9 mg/dL (ref 1.7–2.5)

## 2020-08-27 LAB — C-REACTIVE PROTEIN: CRP: 27.4 mg/dL — ABNORMAL HIGH (ref 0.00–0.90)

## 2020-08-27 LAB — SODIUM, UR, RANDOM
Sodium,Ur: <15 MMOL/L
Sodium,urine random: <15 MMOL/L

## 2020-08-27 LAB — SEDIMENTATION RATE, AUTOMATED: Sed Rate: 61 MM/HR — ABNORMAL HIGH (ref 0–15)

## 2020-08-27 LAB — OSMOLALITY, URINE: Osmolality, Urine: 243 MOSM/kg H2O (ref 50–1500)

## 2020-08-27 LAB — CBC WITH AUTOMATED DIFF
ABS. BASOPHILS: 0.1 10*3/uL (ref 0.0–0.2)
ABS. BASOPHILS: 0 10*3/uL (ref 0.0–0.2)
ABS. EOSINOPHILS: 0 10*3/uL (ref 0.0–0.5)
ABS. EOSINOPHILS: 0 10*3/uL (ref 0.0–0.5)
ABS. IMM. GRANS.: 0.6 10*3/uL — ABNORMAL HIGH (ref 0.0–0.1)
ABS. IMM. GRANS.: 0.5 10*3/uL — ABNORMAL HIGH (ref 0.0–0.1)
ABS. LYMPHOCYTES: 1.4 10*3/uL (ref 1.0–4.5)
ABS. LYMPHOCYTES: 1.9 10*3/uL (ref 1.0–4.5)
ABS. MONOCYTES: 1.2 10*3/uL — ABNORMAL HIGH (ref 0.1–0.8)
ABS. MONOCYTES: 1.6 10*3/uL — ABNORMAL HIGH (ref 0.1–0.8)
ABS. NEUTROPHILS: 12.8 10*3/uL — ABNORMAL HIGH (ref 1.9–7.8)
ABS. NEUTROPHILS: 12.9 10*3/uL — ABNORMAL HIGH (ref 1.9–7.8)
BASOPHILS: 0 %
BASOPHILS: 0 %
EOSINOPHILS: 0 %
EOSINOPHILS: 0 %
HCT: 37.8 % — ABNORMAL LOW (ref 42.0–52.0)
HCT: 35.8 % — ABNORMAL LOW (ref 42.0–52.0)
HGB: 13.4 g/dL — ABNORMAL LOW (ref 14.0–18.0)
HGB: 12.6 g/dL — ABNORMAL LOW (ref 14.0–18.0)
IMMATURE GRANULOCYTES: 4 %
IMMATURE GRANULOCYTES: 3 %
LYMPHOCYTES: 9 %
LYMPHOCYTES: 11 %
MCH: 29.1 PG (ref 28.0–34.0)
MCH: 29.2 PG (ref 28.0–34.0)
MCHC: 35.4 g/dL (ref 32.0–36.0)
MCHC: 35.2 g/dL (ref 32.0–36.0)
MCV: 82.2 FL (ref 80.0–100.0)
MCV: 83.1 FL (ref 80.0–100.0)
MONOCYTES: 8 %
MONOCYTES: 10 %
MPV: 11.8 FL (ref 7.0–12.0)
MPV: 12.1 FL — ABNORMAL HIGH (ref 7.0–12.0)
NEUTROPHILS: 79 %
NEUTROPHILS: 76 %
NRBC: 0 PER 100 WBC (ref 0.0–1.0)
NRBC: 0 PER 100 WBC (ref 0.0–1.0)
PLATELET: 69 10*3/uL — ABNORMAL LOW (ref 150–400)
PLATELET: 78 10*3/uL — ABNORMAL LOW (ref 150–400)
RBC: 4.6 M/uL (ref 4.50–6.00)
RBC: 4.31 M/uL — ABNORMAL LOW (ref 4.50–6.00)
RDW: 13.2 % (ref 11.5–13.5)
RDW: 13.3 % (ref 11.5–13.5)
WBC: 16.2 10*3/uL — ABNORMAL HIGH (ref 4.8–10.8)
WBC: 17 10*3/uL — ABNORMAL HIGH (ref 4.8–10.8)

## 2020-08-27 LAB — UA WITH REFLEX MICRO AND CULTURE
Bilirubin: NEGATIVE
Glucose: 100 mg/dL — AB
Ketone: NEGATIVE mg/dL
Leukocyte Esterase: NEGATIVE
Nitrites: NEGATIVE
Specific gravity: 1.015 (ref 1.005–1.030)
Urobilinogen: >=8 EU/dL (ref 0.1–1.0)
pH (UA): 6.5 (ref 5.0–9.0)

## 2020-08-27 LAB — METABOLIC PANEL, COMPREHENSIVE
A-G Ratio: 0.7
A-G Ratio: 0.6
ALT (SGPT): 86 U/L — ABNORMAL HIGH (ref 3–35)
ALT (SGPT): 68 U/L — ABNORMAL HIGH (ref 3–35)
AST (SGOT): 106 U/L — ABNORMAL HIGH (ref 15–40)
AST (SGOT): 78 U/L — ABNORMAL HIGH (ref 15–40)
Albumin: 2.6 g/dL — ABNORMAL LOW (ref 3.5–5.0)
Albumin: 2.3 g/dL — ABNORMAL LOW (ref 3.5–5.0)
Alk. phosphatase: 192 U/L — ABNORMAL HIGH (ref 35–100)
Alk. phosphatase: 195 U/L — ABNORMAL HIGH (ref 35–100)
Anion gap: 15 mmol/L
Anion gap: 15 mmol/L
BUN/Creatinine ratio: 21
BUN/Creatinine ratio: 17
BUN: 19 MG/DL (ref 7–20)
BUN: 14 MG/DL (ref 7–20)
Bilirubin, total: 1 mg/dL (ref 0.10–1.20)
Bilirubin, total: 0.6 mg/dL (ref 0.10–1.20)
CO2: 27 mmol/L (ref 20–32)
CO2: 27 mmol/L (ref 20–32)
Calcium: 7.6 MG/DL — ABNORMAL LOW (ref 8.8–10.5)
Calcium: 7.4 MG/DL — ABNORMAL LOW (ref 8.8–10.5)
Chloride: 86 mmol/L — ABNORMAL LOW (ref 100–110)
Chloride: 92 mmol/L — ABNORMAL LOW (ref 100–110)
Creatinine: 0.91 MG/DL (ref 0.40–1.20)
Creatinine: 0.81 MG/DL (ref 0.40–1.20)
GFR est AA: >60 mL/min/{1.73_m2} (ref >60)
GFR est AA: >60 mL/min/{1.73_m2} (ref >60)
GFR est non-AA: >60 mL/min/{1.73_m2} (ref >60)
GFR est non-AA: >60 mL/min/{1.73_m2} (ref >60)
Globulin: 4 g/dL
Globulin: 3.6 g/dL
Glucose: 121 mg/dL — ABNORMAL HIGH (ref 75–110)
Glucose: 143 mg/dL — ABNORMAL HIGH (ref 75–110)
Potassium: 3.5 mmol/L (ref 3.5–5.0)
Potassium: 3.6 mmol/L (ref 3.5–5.0)
Protein, total: 6.6 g/dL (ref 6.2–8.0)
Protein, total: 5.9 g/dL — ABNORMAL LOW (ref 6.2–8.0)
Sodium: 124 mmol/L — ABNORMAL LOW (ref 135–145)
Sodium: 130 mmol/L — ABNORMAL LOW (ref 135–145)

## 2020-08-27 LAB — COVID-19,INFLUENZA A/B,RSV PANEL
Influenza A by PCR: NEGATIVE
Influenza B by PCR: NEGATIVE
RSV by PCR: NEGATIVE
SARS-CoV-2 by PCR: NEGATIVE

## 2020-08-27 LAB — C REACTIVE PROTEIN, QT: C-Reactive protein: 27.4 mg/dL — ABNORMAL HIGH (ref 0.00–0.90)

## 2020-08-27 LAB — LACTIC ACID W/REFLEX: Lactic Acid w/ Reflex: 1.5 MMOL/L (ref 0.5–2.0)

## 2020-08-27 LAB — TSH CASCADE: TSH, 3rd generation: 0.813 u[IU]/mL (ref 0.340–5.600)

## 2020-08-27 LAB — OSMOLALITY, UR: Osmolality,urine: 243 MOSM/kg H2O (ref 50–1500)

## 2020-08-27 LAB — SED RATE, AUTOMATED: Sed rate, automated: 61 MM/HR — ABNORMAL HIGH (ref 0–15)

## 2020-08-27 MED ORDER — GADOBUTROL 10 MMOL/10 ML (1 MMOL/ML) IV
10 mmol/ mL (1 mmol/mL) | Freq: Once | INTRAVENOUS | Status: AC
Start: 2020-08-27 — End: 2020-08-27
  Administered 2020-08-27: 18:00:00 via INTRAVENOUS

## 2020-08-27 MED ORDER — CEFTRIAXONE 2 GRAM SOLUTION FOR INJECTION
2 gram | INTRAMUSCULAR | Status: DC
Start: 2020-08-27 — End: 2020-09-05
  Administered 2020-08-27 – 2020-09-04 (×9): via INTRAVENOUS

## 2020-08-27 MED ORDER — SODIUM CHLORIDE 0.9 % IJ SYRG
Freq: Two times a day (BID) | INTRAMUSCULAR | Status: DC
Start: 2020-08-27 — End: 2020-09-05
  Administered 2020-08-27 – 2020-09-04 (×17): via INTRAVENOUS

## 2020-08-27 MED ORDER — PHARMACY VANCOMYCIN NOTE
Status: DC | PRN
Start: 2020-08-27 — End: 2020-08-28

## 2020-08-27 MED ORDER — DOXEPIN 50 MG CAP
50 mg | Freq: Four times a day (QID) | ORAL | Status: DC | PRN
Start: 2020-08-27 — End: 2020-09-01
  Administered 2020-08-28 – 2020-08-31 (×5): via ORAL

## 2020-08-27 MED ORDER — ACETAMINOPHEN (TYLENOL) SOLUTION 32MG/ML
ORAL | Status: DC | PRN
Start: 2020-08-27 — End: 2020-09-05

## 2020-08-27 MED ORDER — METHOCARBAMOL 500 MG TAB
500 mg | Freq: Four times a day (QID) | ORAL | Status: DC | PRN
Start: 2020-08-27 — End: 2020-09-05
  Administered 2020-08-28 – 2020-09-04 (×11): via ORAL

## 2020-08-27 MED ORDER — SODIUM CHLORIDE 0.9 % IJ SYRG
INTRAMUSCULAR | Status: DC | PRN
Start: 2020-08-27 — End: 2020-09-05
  Administered 2020-08-30: 19:00:00 via INTRAVENOUS

## 2020-08-27 MED ORDER — LORAZEPAM 2 MG/ML IJ SOLN
2 mg/mL | INTRAMUSCULAR | Status: DC | PRN
Start: 2020-08-27 — End: 2020-08-28

## 2020-08-27 MED ORDER — POTASSIUM CHLORIDE SR 20 MEQ TAB, PARTICLES/CRYSTALS
20 mEq | ORAL | Status: AC
Start: 2020-08-27 — End: 2020-08-27
  Administered 2020-08-27: 14:00:00 via ORAL

## 2020-08-27 MED ORDER — SODIUM CHLORIDE 0.9 % IV
INTRAVENOUS | Status: DC
Start: 2020-08-27 — End: 2020-08-28
  Administered 2020-08-27: 12:00:00 via INTRAVENOUS

## 2020-08-27 MED ORDER — ONDANSETRON (PF) 4 MG/2 ML INJECTION
4 mg/2 mL | Freq: Four times a day (QID) | INTRAMUSCULAR | Status: DC | PRN
Start: 2020-08-27 — End: 2020-09-05

## 2020-08-27 MED ORDER — SODIUM CHLORIDE 0.9 % IV PIGGY BACK
2 gram | INTRAVENOUS | Status: DC
Start: 2020-08-27 — End: 2020-08-27
  Administered 2020-08-27: 12:00:00 via INTRAVENOUS

## 2020-08-27 MED ORDER — LACTATED RINGERS IV
INTRAVENOUS | Status: DC
Start: 2020-08-27 — End: 2020-08-27
  Administered 2020-08-27: 05:00:00 via INTRAVENOUS

## 2020-08-27 MED ORDER — SODIUM CHLORIDE 0.9 % IV
10 gram | Freq: Three times a day (TID) | INTRAVENOUS | Status: DC
Start: 2020-08-27 — End: 2020-08-28
  Administered 2020-08-27 – 2020-08-28 (×3): via INTRAVENOUS

## 2020-08-27 MED ORDER — POLYETHYLENE GLYCOL 3350 17 GRAM (100 %) ORAL POWDER PACKET
17 gram | Freq: Every day | ORAL | Status: DC
Start: 2020-08-27 — End: 2020-09-05
  Administered 2020-09-01: 13:00:00 via ORAL

## 2020-08-27 MED ORDER — ACETAMINOPHEN 325 MG TABLET
325 mg | ORAL | Status: DC | PRN
Start: 2020-08-27 — End: 2020-09-05
  Administered 2020-08-28 – 2020-09-02 (×6): via ORAL

## 2020-08-27 MED ORDER — CLONIDINE 0.1 MG TAB
0.1 mg | ORAL | Status: DC | PRN
Start: 2020-08-27 — End: 2020-09-05
  Administered 2020-08-29 – 2020-09-01 (×3): via ORAL

## 2020-08-27 MED ORDER — OXYCODONE 5 MG TAB
5 mg | ORAL | Status: DC | PRN
Start: 2020-08-27 — End: 2020-09-04
  Administered 2020-08-27 – 2020-09-04 (×36): via ORAL

## 2020-08-27 MED ORDER — DROPERIDOL 2.5 MG/ML IJ SOLN
2.5 mg/mL | Freq: Once | INTRAMUSCULAR | Status: AC
Start: 2020-08-27 — End: 2020-08-27
  Administered 2020-08-27: 07:00:00 via INTRAVENOUS

## 2020-08-27 MED ORDER — KETOROLAC TROMETHAMINE 15 MG/ML INJECTION
15 mg/mL | Freq: Once | INTRAMUSCULAR | Status: AC
Start: 2020-08-27 — End: 2020-08-27
  Administered 2020-08-27: 07:00:00 via INTRAVENOUS

## 2020-08-27 MED ORDER — LOPERAMIDE 2 MG CAP
2 mg | ORAL | Status: DC | PRN
Start: 2020-08-27 — End: 2020-09-05

## 2020-08-27 MED ORDER — ACETAMINOPHEN 650 MG RECTAL SUPPOSITORY
650 mg | RECTAL | Status: DC | PRN
Start: 2020-08-27 — End: 2020-09-05

## 2020-08-27 MED ORDER — ONDANSETRON 4 MG TAB, RAPID DISSOLVE
4 mg | Freq: Four times a day (QID) | ORAL | Status: DC | PRN
Start: 2020-08-27 — End: 2020-09-05

## 2020-08-27 MED ORDER — DOCUSATE SODIUM 100 MG CAP
100 mg | Freq: Every day | ORAL | Status: DC
Start: 2020-08-27 — End: 2020-08-28

## 2020-08-27 MED ORDER — DICYCLOMINE 10 MG CAP
10 mg | Freq: Three times a day (TID) | ORAL | Status: DC | PRN
Start: 2020-08-27 — End: 2020-09-05

## 2020-08-27 MED ORDER — VANCOMYCIN 1,000 MG IV SOLR
1000 mg | INTRAVENOUS | Status: AC
Start: 2020-08-27 — End: 2020-08-27
  Administered 2020-08-27: 07:00:00 via INTRAVENOUS

## 2020-08-27 MED ORDER — NALOXONE 0.4 MG/ML INJECTION
0.4 mg/mL | INTRAMUSCULAR | Status: DC | PRN
Start: 2020-08-27 — End: 2020-09-05

## 2020-08-27 MED ORDER — SODIUM CHLORIDE 0.9 % IV PIGGY BACK
1 gram | INTRAVENOUS | Status: AC
Start: 2020-08-27 — End: 2020-08-27
  Administered 2020-08-27: 05:00:00 via INTRAVENOUS

## 2020-08-27 MED FILL — OXYCODONE 5 MG TAB: 5 mg | ORAL | Qty: 1

## 2020-08-27 MED FILL — KETOROLAC TROMETHAMINE 15 MG/ML INJECTION: 15 mg/mL | INTRAMUSCULAR | Qty: 1

## 2020-08-27 MED FILL — DROPERIDOL 2.5 MG/ML IJ SOLN: 2.5 mg/mL | INTRAMUSCULAR | Qty: 2

## 2020-08-27 MED FILL — VANCOMYCIN 10 GRAM IV SOLR: 10 gram | INTRAVENOUS | Qty: 1250

## 2020-08-27 MED FILL — SODIUM CHLORIDE 0.9 % IV PIGGY BACK: INTRAVENOUS | Qty: 50

## 2020-08-27 MED FILL — CEFTRIAXONE 2 GRAM SOLUTION FOR INJECTION: 2 gram | INTRAMUSCULAR | Qty: 2

## 2020-08-27 MED FILL — CEFEPIME 1 GRAM SOLUTION FOR INJECTION: 1 gram | INTRAMUSCULAR | Qty: 1

## 2020-08-27 MED FILL — ACETAMINOPHEN 325 MG TABLET: 325 mg | ORAL | Qty: 2

## 2020-08-27 MED FILL — LACTATED RINGERS IV: INTRAVENOUS | Qty: 3000

## 2020-08-27 MED FILL — SODIUM CHLORIDE 0.9 % IV: INTRAVENOUS | Qty: 1000

## 2020-08-27 MED FILL — GADAVIST 10 MMOL/10 ML (1 MMOL/ML) INTRAVENOUS SOLUTION: 10 mmol/ mL (1 mmol/mL) | INTRAVENOUS | Qty: 8

## 2020-08-27 MED FILL — BD POSIFLUSH NORMAL SALINE 0.9 % INJECTION SYRINGE: INTRAMUSCULAR | Qty: 10

## 2020-08-27 MED FILL — PHARMACY VANCOMYCIN NOTE: Qty: 1

## 2020-08-27 MED FILL — POTASSIUM CHLORIDE SR 20 MEQ TAB, PARTICLES/CRYSTALS: 20 mEq | ORAL | Qty: 2

## 2020-08-27 NOTE — Progress Notes (Signed)
Hospitalist Progress Note    Subjective:   CC: "I still feel bad."    S: Denies cough, CP, SOB, dysuria.  Has some back pain.    O:  Physical Exam  Vitals: Reviewed  General:  No acute distress.  Nontoxic.  Somnolent  Oriented x3  Eyes:  No scleral icterus.  No conjunctival injection.  Tracking with eyes.  Pupils equally round  HEENT:  Normocephalic. Atraumatic. No angioedema.  Voice is not muffled.  Neck:   No JVD.  No stridor  Cardiovascular:  Regular rate and rhythm.  No anasarca.  No lower extremity edema  Respiratory:  Speaks in full sentences.  No accessory muscle usage noted.  Work of breathing appears unlabored.  Acyanotic.  Lungs clear to auscultation bilaterally.  Abdomen/GI:  Soft, non-tender.  No rebound or guarding, bowel sounds present.  MSK:  All limbs are noted to be moving spontaneously  Integumentary:  Does not appear diaphoretic.  No pallor  Neuro:  Symmetric face.  Fluent speech.  Follows commands.  No focal motor deficits noted  Psych:  Cooperative.  Pleasant.  Does not appear to be responding to internal stimuli.  Normal mood      A/P  # Sepsis secondary to bacteremia vs tick borne disease.     - Blood Culture + for GPC in chains   - Continue Vanco/Ceftriaxone - DAY 2/?   - MRI neg for epidural abscess, osteomyelitis or discitis.      # Thrombocytopenia - ?secondary to sepsis.  Would fit w/ tick borne disease.     -CBC in the AM  ??  # Hyponatremia - hypovolemic related to sepsis  - IVF  - Recheck in the AM  ??  # Active IVDU with risks of withdrawal from opiates and amphetamines  - COWS  ??  # Constipation  - bowel regimen  ??  # 5 mm soft tissue nodule in the right middle lobe  - will need outpatient follow-up  ??  # Diet:  Regular  # DVT Prophylaxis:  SCD due to thrombocytopenia  # Code status:  Full code       Visit Vitals  BP 120/66 (BP 1 Location: Left upper arm, BP Patient Position: At rest)   Pulse 76   Temp 99.1 ??F (37.3 ??C)   Resp 18   Ht 5\' 9"  (1.753 m)   Wt 72 kg (158 lb 11.7 oz)   SpO2  99%   BMI 23.44 kg/m??      O2 Device: None (Room air)    Temp (24hrs), Avg:99.1 ??F (37.3 ??C), Min:99.1 ??F (37.3 ??C), Max:99.1 ??F (37.3 ??C)      No intake/output data recorded.  11/14 0701 - 11/15 1900  In: 2040 [I.V.:2040]  Out: 600 [Urine:600]       I reviewed labs below:   Labs: Results:       Chemistry Recent Labs     08/27/20  0715 08/26/20  2156   GLU 143* 121*   NA 130* 124*   K 3.6 3.5   CL 92* 86*   CO2 27 27   BUN 14 19   CREA 0.81 0.91   CA 7.4* 7.6*   AGAP 15 15   BUCR 17 21   AP 195* 192*   TP 5.9* 6.6   ALB 2.3* 2.6*   GLOB 3.6 4.0   AGRAT 0.6 0.7      CBC w/Diff Recent Labs     08/27/20  0715  08/26/20  2156   WBC 17.0* 16.2*   RBC 4.31* 4.60   HGB 12.6* 13.4*   HCT 35.8* 37.8*   PLT 78* 69*   GRANS 76 79   LYMPH 11 9   EOS 0 0      Coagulation No results for input(s): PTP, INR, APTT, INREXT in the last 72 hours.    Liver Enzymes Recent Labs     08/27/20  0715   TP 5.9*   ALB 2.3*   AP 195*   resultrcnt{24h@       ~~~~~~~~~        Data Review    Recent Results (from the past 12 hour(s))   SODIUM, UR, RANDOM    Collection Time: 08/27/20 11:38 AM   Result Value Ref Range    Sodium,urine random <15 MMOL/L   OSMOLALITY, UR    Collection Time: 08/27/20 11:38 AM   Result Value Ref Range    Osmolality,urine 243 50 - 1,500 MOSM/kg H2O         Signed By: Antony Madura, DO     August 27, 2020       I apologize for any spelling or grammatical errors I may have overlooked while proof-reading.  If there are any questions or confusion please contact the hospitalist office at [phone]

## 2020-08-27 NOTE — ED Notes (Signed)
Patient refusing blood draw

## 2020-08-27 NOTE — H&P (Signed)
History and Physical    Patient: Paul George MRN: [id number]  SSN: [id number]    Date of Birth: [birth year]  Age: 34 y.o.  Sex: male      PCP: None    Subjective:      Paul George is a 35 y.o. male with IVDU, who presents to the ED complaining of feeling unwell for about 4-5 days.  The patient provides vague complaints and is not very forthcoming during history taking.  He reports feeling weak, fatigued and having pain all over his arms and legs.  He has had nausea and subjective fever.  He reports having cough which is occasionally productive.  Denies any back pain, chest pain, shortness of breath, abdominal pain, vomiting, or diarrhea.  He is an active IV drug user and reports shooting what ever he gets including heroin and methamphetamine.  Last use was yesterday before he came to the ED.  He denies any known tick bite or rashes in the skin.    In the ED upon presentation, he was hemodynamically stable.  But he was noted to be warm to touch as per the ED physician. Lab work was notable for WBC of 16.2, platelets of 69, elevated ESR and CRP, serum sodium of 124 with elevated LFTs.  Urine drug screen was positive for amphetamines and THC.  COVID 19 and flu panel were negative.  He was noncompliant with medical recommendations in the ED and refused blood draws for blood cultures as well as new IV access for CT scan with IV contrast.  He ultimately had chest x-ray and CT abdomen pelvis without contrast.  Chest x-ray had no acute findings while CT abdomen pelvis showed large amount of stool in the colon with no other acute findings and a 5 mm soft tissue nodule in the right middle lobe. He was given fluid bolus along with a dose of cefepime and vancomycin, and referred for inpatient care for presumed sepsis of unclear origin.     Past Medical History:   Diagnosis Date   ??? Blind hypertensive left eye    ??? IV drug abuse ([place])    ??? Polysubstance abuse ([place])      History reviewed. No pertinent surgical history.    Family History   Family history unknown: Yes     Social History     Tobacco Use   ??? Smoking status: Not on file   ??? Smokeless tobacco: Not on file   Substance Use Topics   ??? Alcohol use: Not Currently      Prior to Admission medications    Not on File        No Known Allergies    Review of Systems:  ROS was negative except as mentioned in the HPI above.    Objective:     Vitals:    08/26/20 1852 08/26/20 2317 08/27/20 0422   BP: 102/67  (!) 110/58   Pulse: 91  86   Resp: 18  18   Temp: 99 ??F (37.2 ??C) 99.1 ??F (37.3 ??C)    SpO2: 98%  98%   Weight: 68 kg (150 lb)     Height: 5' 9"  (1.753 m)            Physical Exam:  General:  Young male, conversant but not forthcoming in his answers, in no acute distress  HEENT: PERRLA, EOMI, no pallor or icterus  Neck: supple  Chest: bilateral equal air entry with no added sounds, no wheezing or  crepts  CVS: normal S1S2 with no obvious murmurs or gallop  Abdomen: soft, non tender, no rebound, guarding or rigidity, normoactive bowel sound  Extremities: no pedal edema, no cyanosis or tremors.  Has tenderness over the left upper extremity with minimal erythema.  Has tattoos in bilateral upper extremities with tract marks.  Neurological: awake, alert and oriented. Power 5/5 across major joints. Sensation grossly intact. Cranial nerves grossly intact.        Recent Results (from the past 12 hour(s))   DRUG SCREEN, URINE    Collection Time: 08/26/20  7:00 PM   Result Value Ref Range    THC (TH-CANNABINOL) Positive (A) None detected    COCAINE None detected None detected    BENZODIAZEPINES None detected None detected    BARBITURATES None detected None detected    OPIATES None detected None detected    AMPHETAMINES Positive (A) None detected    METHADONE None detected None detected    OXYCODONE None detected None detected    Drug screen comment       URINE DRUG SCREEN RAPID    NOTE: All positive results by this screening procedure have not been confirmed. Results should be used only for  medical (i.e. treatment) purposes.   EKG, 12 LEAD, INITIAL    Collection Time: 08/26/20  7:00 PM   Result Value Ref Range    Heart Rate 100 bpm    RR Interval 600 ms    Atrial Rate 100 ms    P-R Interval 140 ms    P Duration 113 ms    P Horizontal Axis 7 deg    P Front Axis 61 deg    Q Onset 499 ms    QRSD Interval 114 ms    QT Interval 360 ms    QTcB 465 ms    QTcF 427 ms    QRS Horizontal Axis -39 deg    QRS Axis 50 deg    I-40 Horizontal Axis 52 deg    I-40 Front Axis 65 deg    T-40 Horizontal Axis 257 deg    T-40 Front Axis 259 deg    T Horizontal Axis 56 deg    T Wave Axis 56 deg    S-T Horizontal Axis 89 deg    S-T Front Axis 59 deg   COVID-19,INFLUENZA A/B,RSV PANEL    Collection Time: 08/26/20  8:02 PM   Result Value Ref Range    Specimen source NP SWAB     SARS-CoV-2 Negative Negative    Influenza A by PCR Negative Negative    Influenza B by PCR Negative Negative    RSV by PCR Negative Negative   UA WITH REFLEX MICRO AND CULTURE    Collection Time: 08/26/20  8:02 PM    Specimen: Urine   Result Value Ref Range    Color YELLOW YELLOW    Appearance CLEAR CLEAR    Specific gravity 1.015 1.005 - 1.030    pH (UA) 6.5 5.0 - 9.0    Protein TRACE (A) Negative mg/dL    Glucose 100 (A) Negative mg/dL    Ketone Negative Negative mg/dL    Bilirubin Negative Negative    Blood MODERATE (A) Negative    Urobilinogen >=8.0 0.1 - 1.0 EU/dL    Nitrites Negative Negative    Leukocyte Esterase Negative Negative   URINE MICROSCOPIC WITH REFLEX CULTURE    Collection Time: 08/26/20  8:02 PM    Specimen: Urine   Result Value Ref Range  WBC NONE SEEN <6 /hpf    RBC 0-3 <3 /hpf    Epithelial cells SQUAMOUS EPITHELIAL CELLS  RARE   /lpf    UA:UC IF INDICATED CULTURE NOT INDICATED BY UA RESULT    CBC WITH AUTOMATED DIFF    Collection Time: 08/26/20  9:56 PM   Result Value Ref Range    WBC 16.2 (H) 4.8 - 10.8 K/uL    RBC 4.60 4.50 - 6.00 M/uL    HGB 13.4 (L) 14.0 - 18.0 g/dL    HCT 37.8 (L) 42.0 - 52.0 %    MCV 82.2 80.0 - 100.0 FL     MCH 29.1 28.0 - 34.0 PG    MCHC 35.4 32.0 - 36.0 g/dL    RDW 13.2 11.5 - 13.5 %    PLATELET 69 (L) 150 - 400 K/uL    MPV 11.8 7.0 - 12.0 FL    NEUTROPHILS 79 %    LYMPHOCYTES 9 %    MONOCYTES 8 %    EOSINOPHILS 0 %    BASOPHILS 0 %    NRBC 0.0 0.0 - 1.0 PER 100 WBC    ABS. LYMPHOCYTES 1.4 1.0 - 4.5 K/UL    ABS. MONOCYTES 1.2 (H) 0.1 - 0.8 K/UL    ABS. BASOPHILS 0.1 0.0 - 0.2 K/UL    ABS. EOSINOPHILS 0.0 0.0 - 0.5 K/UL    IMMATURE GRANULOCYTES 4 %    ABS. NEUTROPHILS 12.8 (H) 1.9 - 7.8 K/UL    ABS. IMM. GRANS. 0.6 (H) 0.0 - 0.1 K/UL    WBC COMMENTS TOXIC GRANULATION  PRESENT       WBC COMMENTS VACUOLATED POLYS     DF AUTOMATED    METABOLIC PANEL, COMPREHENSIVE    Collection Time: 08/26/20  9:56 PM   Result Value Ref Range    Sodium 124 (L) 135 - 145 mmol/L    Potassium 3.5 3.5 - 5.0 mmol/L    Chloride 86 (L) 100 - 110 mmol/L    CO2 27 20 - 32 mmol/L    Anion gap 15 mmol/L    Glucose 121 (H) 75 - 110 mg/dL    BUN 19 7 - 20 MG/DL    Creatinine 0.91 0.40 - 1.20 MG/DL    BUN/Creatinine ratio 21     GFR est AA >60 >60 ml/min/1.81m    GFR est non-AA >60 >60 ml/min/1.755m   Calcium 7.6 (L) 8.8 - 10.5 MG/DL    Bilirubin, total 1.00 0.10 - 1.20 mg/dL    ALT (SGPT) 86 (H) 3 - 35 U/L    AST (SGOT) 106 (H) 15 - 40 U/L    Alk. phosphatase 192 (H) 35 - 100 U/L    Protein, total 6.6 6.2 - 8.0 g/dL    Albumin 2.6 (L) 3.5 - 5.0 g/dL    Globulin 4.0 g/dL    A-G Ratio 0.7     MAGNESIUM    Collection Time: 08/26/20  9:56 PM   Result Value Ref Range    Magnesium 1.9 1.7 - 2.5 mg/dL   ETHYL ALCOHOL    Collection Time: 08/26/20  9:56 PM   Result Value Ref Range    ETHYL ALCOHOL None detected <5.0 MG/DL   TSH CASCADE    Collection Time: 08/26/20  9:56 PM   Result Value Ref Range    TSH, 3rd generation 0.813 0.340 - 5.600 uIU/mL   CK    Collection Time: 08/26/20  9:56 PM   Result Value Ref Range  CK 71 50 - 350 U/L   SED RATE, AUTOMATED    Collection Time: 08/26/20  9:56 PM   Result Value Ref Range    Sed rate, automated 61 (H) 0 - 15  MM/HR   C REACTIVE PROTEIN, QT    Collection Time: 08/26/20  9:56 PM   Result Value Ref Range    C-Reactive protein 27.40 (H) 0.00 - 0.90 mg/dL   LACTIC ACID W/REFLEX    Collection Time: 08/26/20 11:33 PM   Result Value Ref Range    Lactic Acid w/ Reflex 1.5 0.5 - 2.0 MMOL/L       CT ABD PELV WO CONT    Result Date: 31-Aug-2020  1. Large amount of stool in the colon. No obstruction. No free air. 2. There is a 5 mm soft tissue nodule in the right middle lobe. Heart size is normal. No pericardial effusion. Fleischner Society Lung Nodule Screening Recommendations: Low Risk Patients: 8 mm: CT at 3, 9, and 24 months or dynamic contrast-enhanced CT, PET, and/or biopsy. High Risk Patients: 8 mm: CT at 3, 9, and 24 months or dynamic contrast-enhanced CT, PET, and/or biopsy. --NOTIFY-- Low risk: Minimal/absent smoking history and other risk factors High risk: Significant smoking and/or other risk factors Note: Guidelines for followup of solid solitary or multiple nodules detected incidentally at CT (newly detected indeterminate nodule in persons at or greater than 47 years old). If multiple nodules present, size of largest nodule determines followup. Nonsolid or partially solid nodules may require longer followup to exclude more indolent adenocarcinoma. Florene Glen, MA, AJR:196, May 2011 THIS IS AN ELECTRONICALLY VERIFIED REPORT Dictated By:  Pauline Good Dictated:  2020/08/31 03:51:55 Transcribed By:  Self-Edited - PowerScribe Transcribed:  2020/08/31 03:51:55 Signed By:  Pauline Good Signed:  08-31-20 03:55:24 Providers for questions regarding this report, Monday ??? Friday 8am-5pm call [facility] at [phone]. From 5pm-7am everyday call [facility] Reading Room at [phone]. WSN: [id number]      Assessment:     Hospital Problems  Date Reviewed: 08/31/20          Codes Class Noted POA    Sepsis [facility]) ICD-10-CM: A41.9  ICD-9-CM: 038.9, 995.91  August 31, 2020 Unknown              Plan:     # Suspected blood  borne infection and/or cellulitis of the left upper extremity and/or acute viral infection in a patient with active IVDU  - leukocytosis with elevated ESR and CRP. Also has mildly elevated LFT and thrombocytopenia which may be part of his sepsis and or related to other etiology like tick-borne disease, HIV, hepatitis etc  - chest x-ray and CT abdomen pelvis with no acute findings per my read.  Follow-up official report.  - will admit to [facility] with telemetry and continuous oximetry  - continue with empiric broad-spectrum antibiotics with vancomycin and ceftriaxone  - IVF  - follow-up 1 set of blood cultures which is currently pending. He has refused repeat blood draws for blood cultures and tick borne panel, HIV, hepatitis panel, as well as new IV access for CT scans with IV contrast. Will try to get those blood draws and consider CT scans of the abdomen/ chest/ right upper extremity if the patient is agreeable in the morning. He is however high risk for signing out AMA.  - monitor CBC and LFT      # Thrombocytopenia  - see above.  Will monitor CBC      # Hyponatremia, likely  hypovolemic related to sepsis  - IVF  - add serum osmolality and sent for urine osmolality and urine sodium  - monitor BMP      # Active IVDU with risks of withdrawal from opiates and amphetamines  - watch and treat for any withdrawals. Will starts on COWS assessment with symptomatic treatment protocol.  Ativan as needed      # Constipation  - bowel regimen      # 5 mm soft tissue nodule in the right middle lobe  - will need outpatient follow-up    # Diet:  Regular    # DVT Prophylaxis:  SCD due to thrombocytopenia    # Code status:  Full code    Diagnosis, prognosis and plan of care explained to the patient, and he verbalizes understanding.      This note was dictated through voice recognition software.  I apologize for any misspelt words, erroneous phrases or other associated errors.    Signed By: Evalyn Casco, MD     August 27, 2020

## 2020-08-27 NOTE — ED Notes (Signed)
Ate 100% of lunch, patient now resting supine, VSS and NAD at this time. Call light in reach, will ctm.

## 2020-08-27 NOTE — ED Notes (Signed)
Patient cannot keep hand still, small 22G in thumb continues to infuse but catheter tip continues to have to be readjusted so pump will continue to flow with NS 125 mL/hr. Patient became upset with Clinical research associate as, once again Clinical research associate had to adjust dressing to keep IV patent. Patient states "might as well just fucking take it out", writer questioned why, he stated "It's not going to last much longer and then I'm just going to fucking leave". He then stated "and then you're not fucking putting another in". Patient was educated on importance of receiving IV abx as ordered and reasoning for admission to hospital,  he did not respond to this educational period. Will ctm.

## 2020-08-27 NOTE — ED Notes (Signed)
Patient to CT via WC at this time.

## 2020-08-27 NOTE — ED Notes (Signed)
 TRANSFER - OUT REPORT:    Verbal report given to katieRN on Paul George  being transferred to [facility] for routine progression of care       Report consisted of patient's Situation, Background, Assessment and   Recommendations(SBAR).     Information from the following report(s) SBAR, ED Summary, Intake/Output and MAR was reviewed with the receiving nurse.    Lines:   Peripheral IV 08/26/20 Right Hand (Active)   Site Assessment Clean, dry, & intact 08/26/20 2331   Phlebitis Assessment 0 08/26/20 2331   Infiltration Assessment 0 08/26/20 2331   Dressing Status Clean, dry, & intact 08/26/20 2331   Hub Color/Line Status Blue 08/26/20 2331       Peripheral IV 08/27/20 Upper; Inner; Right Arm (Active)   Site Assessment Clean, dry, & intact 08/27/20 1135   Phlebitis Assessment 0 08/27/20 1135   Infiltration Assessment 0 08/27/20 1135   Dressing Status Clean, dry, & intact 08/27/20 1135        Opportunity for questions and clarification was provided.      Patient transported with:   [employer]

## 2020-08-27 NOTE — Progress Notes (Signed)
1800 Patient arrived to floor lethargic but answers all questions when asked.  Bilateral swelling in hands that is red and hot to the touch.  Patient endorses pain 8/10, generalized.  Endorses that redness swelling and pain in hands is from missed injection.  Medicated with oxycodone.  Patient educated on current plan of care.    K.Rollins, RN

## 2020-08-27 NOTE — ED Notes (Signed)
No changes. Patient admitted but is an ED hold due to no inpatient beds. Patient aware of disposition.

## 2020-08-28 LAB — CBC WITH AUTO DIFFERENTIAL
Basophils %: 0 %
Basophils Absolute: 0.1 10*3/uL (ref 0.0–0.2)
Eosinophils %: 0 %
Eosinophils Absolute: 0 10*3/uL (ref 0.0–0.5)
Granulocyte Absolute Count: 0.5 10*3/uL — ABNORMAL HIGH (ref 0.0–0.1)
Hematocrit: 35.3 % — ABNORMAL LOW (ref 42.0–52.0)
Hemoglobin: 12.3 g/dL — ABNORMAL LOW (ref 14.0–18.0)
Immature Granulocytes: 3 %
Lymphocytes %: 19 %
Lymphocytes Absolute: 3.2 10*3/uL (ref 1.0–4.5)
MCH: 29.1 PG (ref 28.0–34.0)
MCHC: 34.8 g/dL (ref 32.0–36.0)
MCV: 83.5 FL (ref 80.0–100.0)
MPV: 11.2 FL (ref 7.0–12.0)
Monocytes %: 11 %
Monocytes Absolute: 1.9 10*3/uL — ABNORMAL HIGH (ref 0.1–0.8)
Neutrophils %: 67 %
Neutrophils Absolute: 11.3 10*3/uL — ABNORMAL HIGH (ref 1.9–7.8)
Nucleated RBCs: 0 PER 100 WBC (ref 0.0–1.0)
Platelets: 136 10*3/uL — ABNORMAL LOW (ref 150–400)
RBC: 4.23 M/uL — ABNORMAL LOW (ref 4.50–6.00)
RDW: 13.6 % — ABNORMAL HIGH (ref 11.5–13.5)
WBC: 16.8 10*3/uL — ABNORMAL HIGH (ref 4.8–10.8)

## 2020-08-28 LAB — COMPREHENSIVE METABOLIC PANEL
ALT: 53 U/L — ABNORMAL HIGH (ref 3–35)
AST: 42 U/L — ABNORMAL HIGH (ref 15–40)
Albumin/Globulin Ratio: 0.6
Albumin: 2.1 g/dL — ABNORMAL LOW (ref 3.5–5.0)
Alkaline Phosphatase: 167 U/L — ABNORMAL HIGH (ref 35–100)
Anion Gap: 11 mmol/L
BUN: 11 MG/DL (ref 7–20)
Bun/Cre Ratio: 19 NA
CO2: 26 mmol/L (ref 20–32)
Calcium: 7.5 MG/DL — ABNORMAL LOW (ref 8.8–10.5)
Chloride: 104 mmol/L (ref 100–110)
Creatinine: 0.59 MG/DL (ref 0.40–1.20)
EGFR IF NonAfrican American: >60 mL/min/{1.73_m2} (ref >60)
GFR African American: >60 mL/min/{1.73_m2} (ref >60)
Globulin: 3.8 g/dL
Glucose: 91 mg/dL (ref 75–110)
Potassium: 3.3 mmol/L — ABNORMAL LOW (ref 3.5–5.0)
Sodium: 138 mmol/L (ref 135–145)
Total Bilirubin: 0.5 mg/dL (ref 0.10–1.20)
Total Protein: 5.9 g/dL — ABNORMAL LOW (ref 6.2–8.0)

## 2020-08-28 LAB — VANCOMYCIN TROUGH
DOSE DATE: 200
DOSE TIME: 11162021
Vancomycin, Trough: 5.9 ug/mL — ABNORMAL LOW (ref 10.0–20.0)

## 2020-08-28 LAB — MAGNESIUM
Magnesium: 1.9 mg/dL (ref 1.7–2.5)
Magnesium: 1.9 mg/dL (ref 1.7–2.5)

## 2020-08-28 LAB — LYME DISEASE SEROLOGY EVALUATION(ELISA)
LYME DISEASE SEROLOGY: NEGATIVE
Lyme Disease Serology: NEGATIVE

## 2020-08-28 LAB — METABOLIC PANEL, COMPREHENSIVE
A-G Ratio: 0.6
ALT (SGPT): 53 U/L — ABNORMAL HIGH (ref 3–35)
AST (SGOT): 42 U/L — ABNORMAL HIGH (ref 15–40)
Albumin: 2.1 g/dL — ABNORMAL LOW (ref 3.5–5.0)
Alk. phosphatase: 167 U/L — ABNORMAL HIGH (ref 35–100)
Anion gap: 11 mmol/L
BUN/Creatinine ratio: 19
BUN: 11 MG/DL (ref 7–20)
Bilirubin, total: 0.5 mg/dL (ref 0.10–1.20)
CO2: 26 mmol/L (ref 20–32)
Calcium: 7.5 MG/DL — ABNORMAL LOW (ref 8.8–10.5)
Chloride: 104 mmol/L (ref 100–110)
Creatinine: 0.59 MG/DL (ref 0.40–1.20)
GFR est AA: >60 mL/min/{1.73_m2} (ref >60)
GFR est non-AA: >60 mL/min/{1.73_m2} (ref >60)
Globulin: 3.8 g/dL
Glucose: 91 mg/dL (ref 75–110)
Potassium: 3.3 mmol/L — ABNORMAL LOW (ref 3.5–5.0)
Protein, total: 5.9 g/dL — ABNORMAL LOW (ref 6.2–8.0)
Sodium: 138 mmol/L (ref 135–145)

## 2020-08-28 LAB — CBC WITH AUTOMATED DIFF
ABS. BASOPHILS: 0.1 10*3/uL (ref 0.0–0.2)
ABS. EOSINOPHILS: 0 10*3/uL (ref 0.0–0.5)
ABS. IMM. GRANS.: 0.5 10*3/uL — ABNORMAL HIGH (ref 0.0–0.1)
ABS. LYMPHOCYTES: 3.2 10*3/uL (ref 1.0–4.5)
ABS. MONOCYTES: 1.9 10*3/uL — ABNORMAL HIGH (ref 0.1–0.8)
ABS. NEUTROPHILS: 11.3 10*3/uL — ABNORMAL HIGH (ref 1.9–7.8)
BASOPHILS: 0 %
EOSINOPHILS: 0 %
HCT: 35.3 % — ABNORMAL LOW (ref 42.0–52.0)
HGB: 12.3 g/dL — ABNORMAL LOW (ref 14.0–18.0)
IMMATURE GRANULOCYTES: 3 %
LYMPHOCYTES: 19 %
MCH: 29.1 PG (ref 28.0–34.0)
MCHC: 34.8 g/dL (ref 32.0–36.0)
MCV: 83.5 FL (ref 80.0–100.0)
MONOCYTES: 11 %
MPV: 11.2 FL (ref 7.0–12.0)
NEUTROPHILS: 67 %
NRBC: 0 PER 100 WBC (ref 0.0–1.0)
PLATELET: 136 10*3/uL — ABNORMAL LOW (ref 150–400)
RBC: 4.23 M/uL — ABNORMAL LOW (ref 4.50–6.00)
RDW: 13.6 % — ABNORMAL HIGH (ref 11.5–13.5)
WBC: 16.8 10*3/uL — ABNORMAL HIGH (ref 4.8–10.8)

## 2020-08-28 LAB — VANCOMYCIN, TROUGH
Reported dose date: 200
Reported dose time:: 11162021
VANCOMYCIN,TROUGH: 5.9 ug/mL — ABNORMAL LOW (ref 10.0–20.0)

## 2020-08-28 MED ORDER — MAGNESIUM HYDROXIDE 400 MG/5 ML ORAL SUSP
400 mg/5 mL | Freq: Every day | ORAL | Status: DC
Start: 2020-08-28 — End: 2020-09-05
  Administered 2020-09-01: 13:00:00 via ORAL

## 2020-08-28 MED ORDER — SODIUM CHLORIDE 0.9 % IV
10 gram | Freq: Four times a day (QID) | INTRAVENOUS | Status: DC
Start: 2020-08-28 — End: 2020-08-28

## 2020-08-28 MED ORDER — LORAZEPAM 2 MG/ML IJ SOLN
2 mg/mL | INTRAMUSCULAR | Status: DC | PRN
Start: 2020-08-28 — End: 2020-09-05
  Administered 2020-08-29 – 2020-08-30 (×6): via INTRAVENOUS

## 2020-08-28 MED ORDER — MAGNESIUM HYDROXIDE 400 MG/5 ML ORAL SUSP
400 mg/5 mL | Freq: Every day | ORAL | Status: DC | PRN
Start: 2020-08-28 — End: 2020-08-28

## 2020-08-28 MED FILL — VANCOMYCIN 10 GRAM IV SOLR: 10 gram | INTRAVENOUS | Qty: 1250

## 2020-08-28 MED FILL — OXYCODONE 5 MG TAB: 5 mg | ORAL | Qty: 1

## 2020-08-28 MED FILL — VANCOMYCIN 10 GRAM IV SOLR: 10 gram | INTRAVENOUS | Qty: 1500

## 2020-08-28 MED FILL — ONDANSETRON 4 MG TAB, RAPID DISSOLVE: 4 mg | ORAL | Qty: 1

## 2020-08-28 MED FILL — CEFTRIAXONE 2 GRAM SOLUTION FOR INJECTION: 2 gram | INTRAMUSCULAR | Qty: 2

## 2020-08-28 MED FILL — DOXEPIN 50 MG CAP: 50 mg | ORAL | Qty: 1

## 2020-08-28 MED FILL — METHOCARBAMOL 500 MG TAB: 500 mg | ORAL | Qty: 2

## 2020-08-28 MED FILL — DOCUSATE SODIUM 100 MG CAP: 100 mg | ORAL | Qty: 1

## 2020-08-28 MED FILL — POLYETHYLENE GLYCOL 3350 17 GRAM (100 %) ORAL POWDER PACKET: 17 gram | ORAL | Qty: 1

## 2020-08-28 MED FILL — MILK OF MAGNESIA 400 MG/5 ML ORAL SUSPENSION: 400 mg/5 mL | ORAL | Qty: 30

## 2020-08-28 MED FILL — ACETAMINOPHEN 325 MG TABLET: 325 mg | ORAL | Qty: 2

## 2020-08-28 NOTE — Progress Notes (Signed)
Hospitalist Progress Note    Subjective:   CC: "I'm still in pain"    S: Had back pain, foot pain (when he walks), and pain in his hands    O:  Physical Exam  Vitals: Reviewed  General:  No acute distress.  Nontoxic.  Alert.  Oriented x3  Eyes:  No scleral icterus.  No conjunctival injection.  Tracking with eyes.  Pupils equally round  HEENT:  Normocephalic. Atraumatic. No angioedema.  Voice is not muffled.  Neck:   No JVD.  No stridor  Cardiovascular:  Regular rate and rhythm.  No anasarca.  No lower extremity edema  Respiratory:  Speaks in full sentences.  No accessory muscle usage noted.  Work of breathing appears unlabored.  Acyanotic.  Lungs clear to auscultation bilaterally.  Abdomen/GI:  Soft, non-tender.  No rebound or guarding, bowel sounds present.  MSK:  All limbs are noted to be moving spontaneously. +tenderness along sacrum  Integumentary:  Does not appear diaphoretic.  No pallor  Neuro:  Symmetric face.  Fluent speech.  Follows commands.  No focal motor deficits noted  Psych:  Cooperative.  Pleasant.  Does not appear to be responding to internal stimuli.  Normal mood      A/P  # Sepsis secondary to bacteremia vs tick borne disease.                - Blood Culture + Group C strep              - Continue Ceftriaxone - DAY 3/?   - DC Vanco              - Lumbar MRI ?paraspinal infectious process - not see on CT to my review.   Nothing on physical exam except tenderness  ?? - Consider ECHO but will wait for other cultures to return    # Thrombocytopenia - improving  ??  # Hyponatremia - Resolved    # Active IVDU with risks of withdrawal from opiates and amphetamines  - COWS  - Pt would like to seek sobriety after DC.  ??  # Constipation  -??bowel regimen  ??  # Diet:????Regular  # DVT Prophylaxis:????SCD due to thrombocytopenia  # Code status:????Full code             Visit Vitals  BP (!) 106/57 (BP 1 Location: Left upper arm, BP Patient Position: Lying;At rest)   Pulse 78   Temp 98.9 ??F (37.2 ??C)   Resp 18   Ht 5'  9" (1.753 m)   Wt 72 kg (158 lb 11.7 oz)   SpO2 94%   BMI 23.44 kg/m??      O2 Device: None (Room air)    Temp (24hrs), Avg:99.1 ??F (37.3 ??C), Min:98.9 ??F (37.2 ??C), Max:99.6 ??F (37.6 ??C)      11/16 0701 - 11/16 1900  In: 6440 [P.O.:720; I.V.:700]  Out: 2325 [Urine:2325]  11/14 1901 - 11/16 0700  In: 2040 [I.V.:2040]  Out: 3775 [Urine:3775]       I reviewed labs below:   Labs: Results:       Chemistry Recent Labs     08/28/20  1049 08/27/20  0715 08/26/20  2156   GLU 91 143* 121*   NA 138 130* 124*   K 3.3* 3.6 3.5   CL 104 92* 86*   CO2 26 27 27    BUN 11 14 19    CREA 0.59 0.81 0.91   CA 7.5* 7.4* 7.6*  AGAP 11 15 15    BUCR 19 17 21    AP 167* 195* 192*   TP 5.9* 5.9* 6.6   ALB 2.1* 2.3* 2.6*   GLOB 3.8 3.6 4.0   AGRAT 0.6 0.6 0.7      CBC w/Diff Recent Labs     08/28/20  1049 08/27/20  0715 08/26/20  2156   WBC 16.8* 17.0* 16.2*   RBC 4.23* 4.31* 4.60   HGB 12.3* 12.6* 13.4*   HCT 35.3* 35.8* 37.8*   PLT 136* 78* 69*   GRANS 67 76 79   LYMPH 19 11 9    EOS 0 0 0      Coagulation No results for input(s): PTP, INR, APTT, INREXT in the last 72 hours.    Liver Enzymes Recent Labs     08/28/20  1049   TP 5.9*   ALB 2.1*   AP 167*   resultrcnt{24h@       ~~~~~~~~~        Data Review    Recent Results (from the past 12 hour(s))   VANCOMYCIN, TROUGH    Collection Time: 08/28/20  9:30 AM   Result Value Ref Range    VANCOMYCIN,TROUGH 5.9 (L) 10.0 - 20.0 ug/mL    Reported dose date 0200     Reported dose time: [id number]    CBC WITH AUTOMATED DIFF    Collection Time: 08/28/20 10:49 AM   Result Value Ref Range    WBC 16.8 (H) 4.8 - 10.8 K/uL    RBC 4.23 (L) 4.50 - 6.00 M/uL    HGB 12.3 (L) 14.0 - 18.0 g/dL    HCT 35.3 (L) 42.0 - 52.0 %    MCV 83.5 80.0 - 100.0 FL    MCH 29.1 28.0 - 34.0 PG    MCHC 34.8 32.0 - 36.0 g/dL    RDW 13.6 (H) 11.5 - 13.5 %    PLATELET 136 (L) 150 - 400 K/uL    MPV 11.2 7.0 - 12.0 FL    NEUTROPHILS 67 %    LYMPHOCYTES 19 %    MONOCYTES 11 %    EOSINOPHILS 0 %    BASOPHILS 0 %    NRBC 0.0 0.0 - 1.0 PER 100  WBC    ABS. LYMPHOCYTES 3.2 1.0 - 4.5 K/UL    ABS. MONOCYTES 1.9 (H) 0.1 - 0.8 K/UL    ABS. BASOPHILS 0.1 0.0 - 0.2 K/UL    ABS. EOSINOPHILS 0.0 0.0 - 0.5 K/UL    IMMATURE GRANULOCYTES 3 %    ABS. NEUTROPHILS 11.3 (H) 1.9 - 7.8 K/UL    ABS. IMM. GRANS. 0.5 (H) 0.0 - 0.1 K/UL    DF AUTOMATED    METABOLIC PANEL, COMPREHENSIVE    Collection Time: 08/28/20 10:49 AM   Result Value Ref Range    Sodium 138 135 - 145 mmol/L    Potassium 3.3 (L) 3.5 - 5.0 mmol/L    Chloride 104 100 - 110 mmol/L    CO2 26 20 - 32 mmol/L    Anion gap 11 mmol/L    Glucose 91 75 - 110 mg/dL    BUN 11 7 - 20 MG/DL    Creatinine 0.59 0.40 - 1.20 MG/DL    BUN/Creatinine ratio 19     GFR est AA >60 >60 ml/min/1.68m    GFR est non-AA >60 >60 ml/min/1.777m   Calcium 7.5 (L) 8.8 - 10.5 MG/DL    Bilirubin, total 0.50 0.10 - 1.20 mg/dL  ALT (SGPT) 53 (H) 3 - 35 U/L    AST (SGOT) 42 (H) 15 - 40 U/L    Alk. phosphatase 167 (H) 35 - 100 U/L    Protein, total 5.9 (L) 6.2 - 8.0 g/dL    Albumin 2.1 (L) 3.5 - 5.0 g/dL    Globulin 3.8 g/dL    A-G Ratio 0.6     MAGNESIUM    Collection Time: 08/28/20 10:49 AM   Result Value Ref Range    Magnesium 1.9 1.7 - 2.5 mg/dL         Signed By: Dallie Piles, DO     August 28, 2020       I apologize for any spelling or grammatical errors I may have overlooked while proof-reading.  If there are any questions or confusion please contact the hospitalist office at [phone]

## 2020-08-28 NOTE — Progress Notes (Signed)
Problem: Falls - Risk of  Goal: *Absence of Falls  Description: Document Paul George Fall Risk and appropriate interventions in the flowsheet.  Outcome: Progressing Towards Goal  Note: Fall Risk Interventions:  Mobility Interventions: Bed/chair exit alarm         Medication Interventions: Bed/chair exit alarm, Patient to call before getting OOB, Teach patient to arise slowly    Elimination Interventions: Bed/chair exit alarm, Call light in reach    History of Falls Interventions: Bed/chair exit alarm         Problem: Patient Education: Go to Patient Education Activity  Goal: Patient/Family Education  Outcome: Progressing Towards Goal

## 2020-08-28 NOTE — Progress Notes (Signed)
Patient refusing head to toe assessment at this time. IV assessed, visible integument assessed.

## 2020-08-28 NOTE — Progress Notes (Signed)
1025:Pt refused lab work  and assessment this morning This writer came to room and explain to Pt the need of lab work to continue tx. Pt able to teach back information and still refusing. Pt had  order a vancomycin trough level at 930 am and he is refusing it also. Also lab called this writer to communicate the results of  blood culture( group C strep, sent for sensitivity) Dr Wynona Neat  Made aware and states will came to talk to Pt.  1055:Pt agree to let lab staff to collect morning lab work at this time.  1224: Pt's potassium 3.3, magnesium 1.9, creatinine  0.59.Pt refused  Miralax and colace and Milk of magnesia Pt educated about the need of it. Pt able to teach back information and still refusing . Dr Louanna Raw made aware.

## 2020-08-29 LAB — CBC WITH AUTO DIFFERENTIAL
Eosinophils %: 1 %
Eosinophils Absolute: 0.2 10*3/uL (ref 0.0–0.5)
Granulocyte Absolute Count: 1.2 10*3/uL — ABNORMAL HIGH (ref 0.0–0.1)
Hematocrit: 35 % — ABNORMAL LOW (ref 42.0–52.0)
Hemoglobin: 12.3 g/dL — ABNORMAL LOW (ref 14.0–18.0)
Lymphocytes %: 27 %
Lymphocytes Absolute: 5.3 10*3/uL — ABNORMAL HIGH (ref 1.0–4.5)
MCH: 29.2 PG (ref 28.0–34.0)
MCHC: 35.1 g/dL (ref 32.0–36.0)
MCV: 83.1 FL (ref 80.0–100.0)
MPV: 10.5 FL (ref 7.0–12.0)
Metamyelocytes Relative: 7 %
Monocytes %: 5 %
Monocytes Absolute: 0.8 10*3/uL (ref 0.1–0.8)
Neutrophils %: 55 %
Neutrophils Absolute: 9 10*3/uL — ABNORMAL HIGH (ref 1.9–7.8)
Platelets: 209 10*3/uL (ref 150–400)
RBC: 4.21 M/uL — ABNORMAL LOW (ref 4.50–6.00)
RDW: 13.8 % — ABNORMAL HIGH (ref 11.5–13.5)
Reactive Lymphs: 5 %
WBC: 16.5 10*3/uL — ABNORMAL HIGH (ref 4.8–10.8)

## 2020-08-29 LAB — COMPREHENSIVE METABOLIC PANEL
ALT: 44 U/L — ABNORMAL HIGH (ref 3–35)
AST: 29 U/L (ref 15–40)
Albumin/Globulin Ratio: 0.6
Albumin: 2.3 g/dL — ABNORMAL LOW (ref 3.5–5.0)
Alkaline Phosphatase: 152 U/L — ABNORMAL HIGH (ref 35–100)
Anion Gap: 11 mmol/L
BUN: 13 MG/DL (ref 7–20)
Bun/Cre Ratio: 18 NA
CO2: 25 mmol/L (ref 20–32)
Calcium: 7.8 MG/DL — ABNORMAL LOW (ref 8.8–10.5)
Chloride: 104 mmol/L (ref 100–110)
Creatinine: 0.73 MG/DL (ref 0.40–1.20)
EGFR IF NonAfrican American: >60 mL/min/{1.73_m2} (ref >60)
GFR African American: >60 mL/min/{1.73_m2} (ref >60)
Globulin: 3.8 g/dL
Glucose: 105 mg/dL (ref 75–110)
Potassium: 3.6 mmol/L (ref 3.5–5.0)
Sodium: 136 mmol/L (ref 135–145)
Total Bilirubin: 1 mg/dL (ref 0.10–1.20)
Total Protein: 6.1 g/dL — ABNORMAL LOW (ref 6.2–8.0)

## 2020-08-29 LAB — MAGNESIUM
Magnesium: 1.7 mg/dL (ref 1.7–2.5)
Magnesium: 1.7 mg/dL (ref 1.7–2.5)

## 2020-08-29 LAB — C-REACTIVE PROTEIN: CRP: 10 mg/dL — ABNORMAL HIGH (ref 0.00–0.90)

## 2020-08-29 LAB — SEDIMENTATION RATE, AUTOMATED: Sed Rate: 57 MM/HR — ABNORMAL HIGH (ref 0–15)

## 2020-08-29 LAB — CBC WITH AUTOMATED DIFF
ABS. EOSINOPHILS: 0.2 10*3/uL (ref 0.0–0.5)
ABS. IMM. GRANS.: 1.2 10*3/uL — ABNORMAL HIGH (ref 0.0–0.1)
ABS. LYMPHOCYTES: 5.3 10*3/uL — ABNORMAL HIGH (ref 1.0–4.5)
ABS. MONOCYTES: 0.8 10*3/uL (ref 0.1–0.8)
ABS. NEUTROPHILS: 9 10*3/uL — ABNORMAL HIGH (ref 1.9–7.8)
EOSINOPHILS: 1 %
HCT: 35 % — ABNORMAL LOW (ref 42.0–52.0)
HGB: 12.3 g/dL — ABNORMAL LOW (ref 14.0–18.0)
LYMPHOCYTES: 27 %
MCH: 29.2 PG (ref 28.0–34.0)
MCHC: 35.1 g/dL (ref 32.0–36.0)
MCV: 83.1 FL (ref 80.0–100.0)
METAMYELOCYTES: 7 %
MONOCYTES: 5 %
MPV: 10.5 FL (ref 7.0–12.0)
NEUTROPHILS: 55 %
PLATELET: 209 10*3/uL (ref 150–400)
RBC: 4.21 M/uL — ABNORMAL LOW (ref 4.50–6.00)
RDW: 13.8 % — ABNORMAL HIGH (ref 11.5–13.5)
REACTIVE LYMPHS: 5 %
WBC: 16.5 10*3/uL — ABNORMAL HIGH (ref 4.8–10.8)

## 2020-08-29 LAB — METABOLIC PANEL, COMPREHENSIVE
A-G Ratio: 0.6
ALT (SGPT): 44 U/L — ABNORMAL HIGH (ref 3–35)
AST (SGOT): 29 U/L (ref 15–40)
Albumin: 2.3 g/dL — ABNORMAL LOW (ref 3.5–5.0)
Alk. phosphatase: 152 U/L — ABNORMAL HIGH (ref 35–100)
Anion gap: 11 mmol/L
BUN/Creatinine ratio: 18
BUN: 13 MG/DL (ref 7–20)
Bilirubin, total: 1 mg/dL (ref 0.10–1.20)
CO2: 25 mmol/L (ref 20–32)
Calcium: 7.8 MG/DL — ABNORMAL LOW (ref 8.8–10.5)
Chloride: 104 mmol/L (ref 100–110)
Creatinine: 0.73 MG/DL (ref 0.40–1.20)
GFR est AA: >60 mL/min/{1.73_m2} (ref >60)
GFR est non-AA: >60 mL/min/{1.73_m2} (ref >60)
Globulin: 3.8 g/dL
Glucose: 105 mg/dL (ref 75–110)
Potassium: 3.6 mmol/L (ref 3.5–5.0)
Protein, total: 6.1 g/dL — ABNORMAL LOW (ref 6.2–8.0)
Sodium: 136 mmol/L (ref 135–145)

## 2020-08-29 LAB — SED RATE, AUTOMATED: Sed rate, automated: 57 MM/HR — ABNORMAL HIGH (ref 0–15)

## 2020-08-29 LAB — C REACTIVE PROTEIN, QT: C-Reactive protein: 10 mg/dL — ABNORMAL HIGH (ref 0.00–0.90)

## 2020-08-29 MED ORDER — POTASSIUM CHLORIDE SR 20 MEQ TAB, PARTICLES/CRYSTALS
20 mEq | ORAL | Status: AC
Start: 2020-08-29 — End: 2020-08-29
  Administered 2020-08-29: 21:00:00 via ORAL

## 2020-08-29 MED ORDER — ENOXAPARIN 40 MG/0.4 ML SUB-Q SYRINGE
40 mg/0.4 mL | SUBCUTANEOUS | Status: DC
Start: 2020-08-29 — End: 2020-09-05

## 2020-08-29 MED FILL — DOXEPIN 50 MG CAP: 50 mg | ORAL | Qty: 1

## 2020-08-29 MED FILL — METHOCARBAMOL 500 MG TAB: 500 mg | ORAL | Qty: 2

## 2020-08-29 MED FILL — CLONIDINE 0.1 MG TAB: 0.1 mg | ORAL | Qty: 1

## 2020-08-29 MED FILL — POTASSIUM CHLORIDE SR 20 MEQ TAB, PARTICLES/CRYSTALS: 20 mEq | ORAL | Qty: 2

## 2020-08-29 MED FILL — OXYCODONE 5 MG TAB: 5 mg | ORAL | Qty: 1

## 2020-08-29 MED FILL — ACETAMINOPHEN 325 MG TABLET: 325 mg | ORAL | Qty: 2

## 2020-08-29 MED FILL — LORAZEPAM 2 MG/ML IJ SOLN: 2 mg/mL | INTRAMUSCULAR | Qty: 1

## 2020-08-29 MED FILL — CEFTRIAXONE 2 GRAM SOLUTION FOR INJECTION: 2 gram | INTRAMUSCULAR | Qty: 2

## 2020-08-29 MED FILL — POLYETHYLENE GLYCOL 3350 17 GRAM (100 %) ORAL POWDER PACKET: 17 gram | ORAL | Qty: 1

## 2020-08-29 NOTE — Progress Notes (Signed)
Hospitalist Progress Note    Subjective:   CC: "I hurt all over"    S:  Pt requested increasing opiates for pain all over.      O:  Physical Exam  Vitals: Reviewed  General:  No acute distress.  Nontoxic.  Somnolent.  Oriented x3  Eyes:  No scleral icterus.  No conjunctival injection.  Tracking with eyes.  Pupils equally round  HEENT:  Normocephalic. Atraumatic. No angioedema.  Voice is not muffled.  Neck:   No JVD.  No stridor  Cardiovascular:  Regular rate and rhythm.  No anasarca.  No lower extremity edema  Respiratory:  Speaks in full sentences.  No accessory muscle usage noted.  Work of breathing appears unlabored.  Acyanotic.  Lungs clear to auscultation bilaterally.  Abdomen/GI:  Soft, non-tender.  No rebound or guarding, bowel sounds present.  MSK:  All limbs are noted to be moving spontaneously  Integumentary:  Does not appear diaphoretic.  No pallor  Neuro:  Symmetric face.  Fluent speech.  Follows commands.  No focal motor deficits noted  Psych:  Cooperative.  Pleasant.  Does not appear to be responding to internal stimuli.  Normal mood      A/P  #??Sepsis secondary to bacteremia vs tick borne disease. ??  ????????????????????????- Blood Culture + Group C strep - Sensitives pending  ????????????????????????- Continue Ceftriaxone - DAY 4/?  ??           - ECHO ordered  ??  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  ??  # Constipation  -??bowel regimen - pt refusing  ??  # Diet:????Regular  # DVT Prophylaxis:????Lovenox  # Code status:????Full code        Visit Vitals  BP 116/63 (BP 1 Location: Left upper arm, BP Patient Position: At rest)   Pulse 66   Temp 99.3 ??F (37.4 ??C)   Resp 18   Ht 5' 9"  (1.753 m)   Wt 74.3 kg (163 lb 12.8 oz)   SpO2 97%   BMI 24.19 kg/m??      O2 Device: None (Room air)    Temp (24hrs), Avg:99 ??F (37.2 ??C), Min:98.1 ??F (36.7 ??C), Max:100.4 ??F (38 ??C)      No intake/output data recorded.  11/15 1901 - 11/17 0700  In: 5029 [P.O.:3666;  I.V.:700]  Out: 9800 [Urine:9800]       I reviewed labs below:   Labs: Results:       Chemistry Recent Labs     08/29/20  0718 08/28/20  1049 08/27/20  0715   GLU 105 91 143*   NA 136 138 130*   K 3.6 3.3* 3.6   CL 104 104 92*   CO2 25 26 27    BUN 13 11 14    CREA 0.73 0.59 0.81   CA 7.8* 7.5* 7.4*   AGAP 11 11 15    BUCR 18 19 17    AP 152* 167* 195*   TP 6.1* 5.9* 5.9*   ALB 2.3* 2.1* 2.3*   GLOB 3.8 3.8 3.6   AGRAT 0.6 0.6 0.6      CBC w/Diff Recent Labs     08/29/20  0718 08/28/20  1049 08/27/20  0715   WBC 16.5* 16.8* 17.0*   RBC 4.21* 4.23* 4.31*   HGB 12.3* 12.3* 12.6*   HCT 35.0* 35.3* 35.8*   PLT 209 136* 78*   GRANS 55  67 76   LYMPH 27 19 11    EOS 1 0 0      Coagulation No results for input(s): PTP, INR, APTT, INREXT in the last 72 hours.    Liver Enzymes Recent Labs     08/29/20  0718   TP 6.1*   ALB 2.3*   AP 152*   resultrcnt{24h@       ~~~~~~~~~        Data Review    Recent Results (from the past 12 hour(s))   CBC WITH AUTOMATED DIFF    Collection Time: 08/29/20  7:18 AM   Result Value Ref Range    WBC 16.5 (H) 4.8 - 10.8 K/uL    RBC 4.21 (L) 4.50 - 6.00 M/uL    HGB 12.3 (L) 14.0 - 18.0 g/dL    HCT 35.0 (L) 42.0 - 52.0 %    MCV 83.1 80.0 - 100.0 FL    MCH 29.2 28.0 - 34.0 PG    MCHC 35.1 32.0 - 36.0 g/dL    RDW 13.8 (H) 11.5 - 13.5 %    PLATELET 209 150 - 400 K/uL    MPV 10.5 7.0 - 12.0 FL    NEUTROPHILS 55 %    LYMPHOCYTES 27 %    REACTIVE LYMPHS 5 %    MONOCYTES 5 %    EOSINOPHILS 1 %    METAMYELOCYTES 7 %    ABS. NEUTROPHILS 9.0 (H) 1.9 - 7.8 K/UL    ABS. LYMPHOCYTES 5.3 (H) 1.0 - 4.5 K/UL    ABS. MONOCYTES 0.8 0.1 - 0.8 K/UL    ABS. EOSINOPHILS 0.2 0.0 - 0.5 K/UL    ABS. IMM. GRANS. 1.2 (H) 0.0 - 0.1 K/UL    RBC COMMENTS ANISOCYTOSIS  1+       DF MANUAL    METABOLIC PANEL, COMPREHENSIVE    Collection Time: 08/29/20  7:18 AM   Result Value Ref Range    Sodium 136 135 - 145 mmol/L    Potassium 3.6 3.5 - 5.0 mmol/L    Chloride 104 100 - 110 mmol/L    CO2 25 20 - 32 mmol/L    Anion gap 11 mmol/L     Glucose 105 75 - 110 mg/dL    BUN 13 7 - 20 MG/DL    Creatinine 0.73 0.40 - 1.20 MG/DL    BUN/Creatinine ratio 18     GFR est AA >60 >60 ml/min/1.16m    GFR est non-AA >60 >60 ml/min/1.718m   Calcium 7.8 (L) 8.8 - 10.5 MG/DL    Bilirubin, total 1.00 0.10 - 1.20 mg/dL    ALT (SGPT) 44 (H) 3 - 35 U/L    AST (SGOT) 29 15 - 40 U/L    Alk. phosphatase 152 (H) 35 - 100 U/L    Protein, total 6.1 (L) 6.2 - 8.0 g/dL    Albumin 2.3 (L) 3.5 - 5.0 g/dL    Globulin 3.8 g/dL    A-G Ratio 0.6     MAGNESIUM    Collection Time: 08/29/20  7:18 AM   Result Value Ref Range    Magnesium 1.7 1.7 - 2.5 mg/dL         Signed By: ChDallie PilesDO     August 29, 2020       I apologize for any spelling or grammatical errors I may have overlooked while proof-reading.  If there are any questions or confusion please contact the hospitalist office at [phone]

## 2020-08-29 NOTE — Progress Notes (Signed)
Chart reviewed.  I attempted to see the pt, but he would not wake up to speak with me. I left him the substance use disorder resource list and the homeless resource list on his bedside table. I updated Gerilyn Pilgrim, pt's nurse and he will update the pt.  Care management will continue discharge planning.

## 2020-08-29 NOTE — Progress Notes (Signed)
Patient stated that he was not informed of TEE procedure in the morning and was refusing NPO status at midnight as well as the TEE. Procedure, testing, rationale and formation of care plan was explained to the pt by this RN. Pt agreeable to procedure, need for NPO status at midnight reiterated.    Patient refusing complete head to toe assessment; this RN able to assess PIV and visible integument.

## 2020-08-29 NOTE — Progress Notes (Signed)
Pt having a good day, no overall issues. Plan on TEE tomorrow to r/o endocarditis. Currently 1/2 blood cultures are +. IV abx and PO pain meds for now, will CTM and wait for TEE results.     Problem: Falls - Risk of  Goal: *Absence of Falls  Description: Document Bridgette Habermann Fall Risk and appropriate interventions in the flowsheet.  Outcome: Progressing Towards Goal  Note: Fall Risk Interventions:  Mobility Interventions: Bed/chair exit alarm, Patient to call before getting OOB         Medication Interventions: Bed/chair exit alarm, Patient to call before getting OOB, Teach patient to arise slowly    Elimination Interventions: Bed/chair exit alarm, Call light in reach, Patient to call for help with toileting needs, Urinal in reach    History of Falls Interventions: Bed/chair exit alarm, Door open when patient unattended         Problem: Patient Education: Go to Patient Education Activity  Goal: Patient/Family Education  Outcome: Progressing Towards Goal     Problem: Pain  Goal: *Control of Pain  Outcome: Progressing Towards Goal     Problem: Patient Education: Go to Patient Education Activity  Goal: Patient/Family Education  Outcome: Progressing Towards Goal

## 2020-08-30 ENCOUNTER — Ambulatory Visit: Admit: 2020-08-30 | Payer: MEDICAID

## 2020-08-30 LAB — HEPATITIS PANEL, ACUTE
HCV Ab: REACTIVE — AB
HEP BC AB IGM: NEGATIVE
HEPATITIS A Ab, IgM: NEGATIVE
Hep Bc Ab IgM: NEGATIVE
Hepatitis A Antibody, IgM: NEGATIVE
Hepatitis B Surface Ag: NEGATIVE
Hepatitis B surface Ag: NEGATIVE
Hepatitis C virus Ab: REACTIVE — AB

## 2020-08-30 LAB — EKG 12-LEAD
Atrial Rate: 100 ms
ECG QTC Interval: 360 ms
EKG I-40 FRONT AXIS: 65 deg
EKG I-40 HORIZONTAL AXIS: 52 deg
EKG P DURATION: 113 ms
EKG P FRONT AXIS: 61 deg
EKG P HORIZONTAL AXIS: 7 deg
EKG Q ONSET: 499 ms
EKG QRS AXIS: 50 deg
EKG QRS HORIZONTAL AXIS: -39 deg
EKG QRSD INTERVAL: 114 ms
EKG QTCB: 465 ms
EKG QTCF: 427 ms
EKG RR INTERVAL: 600 ms
EKG S-T FRONT AXIS: 59 deg
EKG S-T HORIZONTAL AXIS: 89 deg
EKG T HORIZONTAL AXIS: 56 deg
EKG T WAVE AXIS: 56 deg
EKG T-40 FRONT AXIS: 259 deg
EKG T-40 HORIZONTAL AXIS: 257 deg
Heart Rate: 100 {beats}/min
P-R Interval: 140 ms

## 2020-08-30 LAB — TICK-BORNE DNA PANEL
ANAPLASMA PHAGOCYTOPHILUM: NEGATIVE
Anaplasma pHagocytopHilum: NEGATIVE
B. miyamotoi By PCR: NEGATIVE
B. miyamotoi by PCR: NEGATIVE
Babesia divergens: NEGATIVE
Babesia divergens: NEGATIVE
Babesia duncani: NEGATIVE
Babesia duncani: NEGATIVE
Babesia microti: NEGATIVE
Babesia microti: NEGATIVE
E. chaffeensis: NEGATIVE
E. chaffeensis: NEGATIVE
E. ewingii/canis: NEGATIVE
E. ewingii/canis: NEGATIVE
E. muris-like: NEGATIVE
E. muris-like: NEGATIVE

## 2020-08-30 LAB — CULTURE, BLOOD 1

## 2020-08-30 LAB — HCV RNA (INTERNATIONAL UNITS)
HCV RNA Detect/Quant: 3490000 IU/mL — AB
HCV RNA Detect/Quant: 3490000 IU/mL — AB

## 2020-08-30 LAB — EKG, 12 LEAD, INITIAL
Atrial Rate: 100 ms
Heart Rate: 100 {beats}/min
I-40 Front Axis: 65 deg
I-40 Horizontal Axis: 52 deg
P Duration: 113 ms
P Front Axis: 61 deg
P Horizontal Axis: 7 deg
P-R Interval: 140 ms
Q Onset: 499 ms
QRS Axis: 50 deg
QRS Horizontal Axis: -39 deg
QRSD Interval: 114 ms
QT Interval: 360 ms
QTcB: 465 ms
QTcF: 427 ms
RR Interval: 600 ms
S-T Front Axis: 59 deg
S-T Horizontal Axis: 89 deg
T Horizontal Axis: 56 deg
T Wave Axis: 56 deg
T-40 Front Axis: 259 deg
T-40 Horizontal Axis: 257 deg

## 2020-08-30 LAB — CULTURE, BLOOD

## 2020-08-30 MED ORDER — LACTATED RINGERS IV
INTRAVENOUS | Status: DC | PRN
Start: 2020-08-30 — End: 2020-08-30
  Administered 2020-08-30: 18:00:00 via INTRAVENOUS

## 2020-08-30 MED ORDER — NICOTINE 14 MG/24 HR DAILY PATCH
14 mg/24 hr | Freq: Every day | TRANSDERMAL | Status: DC
Start: 2020-08-30 — End: 2020-08-29

## 2020-08-30 MED ORDER — NICOTINE (POLACRILEX) 2 MG GUM
2 mg | BUCCAL | Status: DC | PRN
Start: 2020-08-30 — End: 2020-09-05
  Administered 2020-08-30 – 2020-09-04 (×8): via ORAL

## 2020-08-30 MED ORDER — LIDOCAINE (PF) 20 MG/ML (2 %) IV SYRINGE
100 mg/5 mL (2 %) | INTRAVENOUS | Status: DC | PRN
Start: 2020-08-30 — End: 2020-08-30
  Administered 2020-08-30: 18:00:00 via INTRAVENOUS

## 2020-08-30 MED ORDER — PROPOFOL 10 MG/ML IV EMUL
10 mg/mL | INTRAVENOUS | Status: DC | PRN
Start: 2020-08-30 — End: 2020-08-30
  Administered 2020-08-30: 18:00:00 via INTRAVENOUS

## 2020-08-30 MED ORDER — LACTATED RINGERS IV
INTRAVENOUS | Status: DC
Start: 2020-08-30 — End: 2020-09-02
  Administered 2020-08-30: 18:00:00 via INTRAVENOUS

## 2020-08-30 MED ORDER — KETAMINE 20 MG/2 ML (10 MG/ML) IN 0.9 % SODIUM CHLORIDE IV SYRINGE
20 mg/2 mL (10 mg/mL) | INTRAVENOUS | Status: AC
Start: 2020-08-30 — End: ?

## 2020-08-30 MED ORDER — LORAZEPAM 1 MG TAB
1 mg | ORAL | Status: DC | PRN
Start: 2020-08-30 — End: 2020-09-05
  Administered 2020-08-30 – 2020-09-04 (×19): via ORAL

## 2020-08-30 MED ORDER — GLYCOPYRROLATE 0.2 MG/ML IJ SOLN
0.2 mg/mL | INTRAMUSCULAR | Status: DC | PRN
Start: 2020-08-30 — End: 2020-08-30
  Administered 2020-08-30: 18:00:00 via INTRAVENOUS

## 2020-08-30 MED ORDER — PROPOFOL 10 MG/ML IV EMUL
10 mg/mL | INTRAVENOUS | Status: AC
Start: 2020-08-30 — End: ?

## 2020-08-30 MED ORDER — NICOTINE 14 MG/24 HR DAILY PATCH
14 mg/24 hr | Freq: Every day | TRANSDERMAL | Status: DC
Start: 2020-08-30 — End: 2020-09-05

## 2020-08-30 MED ORDER — KETAMINE 20 MG/2 ML (10 MG/ML) IN 0.9 % SODIUM CHLORIDE IV SYRINGE
20 mg/2 mL (10 mg/mL) | INTRAVENOUS | Status: DC | PRN
Start: 2020-08-30 — End: 2020-08-30
  Administered 2020-08-30: 18:00:00 via INTRAVENOUS

## 2020-08-30 MED ORDER — LIDOCAINE (PF) 100 MG/5 ML (2 %) INJECTION SYRINGE
100 mg/5 mL (2 %) | INTRAMUSCULAR | Status: AC
Start: 2020-08-30 — End: ?

## 2020-08-30 MED ORDER — BUPRENORPHINE-NALOXONE 8 MG-2 MG SUBLINGUAL FILM
8-2 mg | Freq: Every day | SUBLINGUAL | Status: DC
Start: 2020-08-30 — End: 2020-09-05
  Administered 2020-08-30 – 2020-09-03 (×5): via SUBLINGUAL

## 2020-08-30 MED ORDER — QUETIAPINE 100 MG TAB
100 mg | Freq: Every evening | ORAL | Status: DC
Start: 2020-08-30 — End: 2020-09-05
  Administered 2020-08-30 – 2020-09-04 (×6): via ORAL

## 2020-08-30 MED ORDER — NICOTINE 14 MG/24 HR DAILY PATCH
14 mg/24 hr | Freq: Every day | TRANSDERMAL | Status: DC
Start: 2020-08-30 — End: 2020-08-30

## 2020-08-30 MED ORDER — GLYCOPYRROLATE 1 MG/5 ML (0.2 MG/ML) INTRAVENOUS SYRINGE
1 mg/5 mL (0.2 mg/mL) | INTRAVENOUS | Status: AC
Start: 2020-08-30 — End: ?

## 2020-08-30 MED FILL — OXYCODONE 5 MG TAB: 5 mg | ORAL | Qty: 1

## 2020-08-30 MED FILL — NICOTINE (POLACRILEX) 2 MG GUM: 2 mg | BUCCAL | Qty: 1

## 2020-08-30 MED FILL — QUETIAPINE 100 MG TAB: 100 mg | ORAL | Qty: 1

## 2020-08-30 MED FILL — LIDOCAINE (PF) 100 MG/5 ML (2 %) INJECTION SYRINGE: 100 mg/5 mL (2 %) | INTRAMUSCULAR | Qty: 5

## 2020-08-30 MED FILL — LORAZEPAM 1 MG TAB: 1 mg | ORAL | Qty: 1

## 2020-08-30 MED FILL — NICOTINE 14 MG/24 HR DAILY PATCH: 14 mg/24 hr | TRANSDERMAL | Qty: 1

## 2020-08-30 MED FILL — LORAZEPAM 2 MG/ML IJ SOLN: 2 mg/mL | INTRAMUSCULAR | Qty: 1

## 2020-08-30 MED FILL — CEFTRIAXONE 2 GRAM SOLUTION FOR INJECTION: 2 gram | INTRAMUSCULAR | Qty: 2

## 2020-08-30 MED FILL — BUPRENORPHINE-NALOXONE 8 MG-2 MG SUBLINGUAL FILM: 8-2 mg | SUBLINGUAL | Qty: 1

## 2020-08-30 MED FILL — LACTATED RINGERS IV: INTRAVENOUS | Qty: 1000

## 2020-08-30 MED FILL — GLYCOPYRROLATE 1 MG/5 ML (0.2 MG/ML) INTRAVENOUS SYRINGE: 1 mg/5 mL (0.2 mg/mL) | INTRAVENOUS | Qty: 5

## 2020-08-30 MED FILL — MILK OF MAGNESIA 400 MG/5 ML ORAL SUSPENSION: 400 mg/5 mL | ORAL | Qty: 30

## 2020-08-30 MED FILL — DOXEPIN 50 MG CAP: 50 mg | ORAL | Qty: 1

## 2020-08-30 MED FILL — PROPOFOL 10 MG/ML IV EMUL: 10 mg/mL | INTRAVENOUS | Qty: 100

## 2020-08-30 MED FILL — KETAMINE 20 MG/2 ML (10 MG/ML) IN 0.9 % SODIUM CHLORIDE IV SYRINGE: 20 mg/2 mL (10 mg/mL) | INTRAVENOUS | Qty: 2

## 2020-08-30 MED FILL — POLYETHYLENE GLYCOL 3350 17 GRAM (100 %) ORAL POWDER PACKET: 17 gram | ORAL | Qty: 1

## 2020-08-30 MED FILL — ENOXAPARIN 40 MG/0.4 ML SUB-Q SYRINGE: 40 mg/0.4 mL | SUBCUTANEOUS | Qty: 0.4

## 2020-08-30 NOTE — Progress Notes (Signed)
TEE done.  No TEE evidence of endocarditis noted.  Patient's echo was essentially normal.  Full report to follow.

## 2020-08-30 NOTE — Anesthesia Post-Procedure Evaluation (Signed)
Procedure(s):  ESOPHAGEAL TRANS ECHOCARDIOGRAM.    total IV anesthesia    Anesthesia Post Evaluation      Multimodal analgesia: multimodal analgesia not used between 6 hours prior to anesthesia start to PACU discharge  Patient location during evaluation: bedside  Patient participation: waiting for patient participation  Level of consciousness: sleepy but conscious  Pain score: 0  Pain management: adequate  Airway patency: patent  Anesthetic complications: yes  Cardiovascular status: acceptable  Respiratory status: acceptable  Hydration status: acceptable  Post anesthesia nausea and vomiting:  none  Final Post Anesthesia Temperature Assessment:  Normothermia (36.0-37.5 degrees C)      INITIAL Post-op Vital signs:   Vitals Value Taken Time   BP 100/64 08/30/20 1354   Temp 36.6 ??C (97.9 ??F) 08/30/20 1354   Pulse 98 08/30/20 1354   Resp 20 08/30/20 1354   SpO2 99 % 08/30/20 1354

## 2020-08-30 NOTE — Progress Notes (Signed)
Hospitalist Progress Note    Subjective:   CC: "Can I have suboxone."    S: Complains of pain everywhere.  Wants to start suboxone.  Denies CP, SOB.      O:  Physical Exam  Vitals: Reviewed  General:  No acute distress.  Nontoxic.  Alert.  Oriented x3  Eyes:  No scleral icterus.  No conjunctival injection.  Tracking with eyes.  Pupils equally round  HEENT:  Normocephalic. Atraumatic. No angioedema.  Voice is not muffled.  Neck:   No JVD.  No stridor  Cardiovascular:  Regular rate and rhythm.  No anasarca.  No lower extremity edema  Respiratory:  Speaks in full sentences.  No accessory muscle usage noted.  Work of breathing appears unlabored.  Acyanotic.  Lungs clear to auscultation bilaterally.  Abdomen/GI:  Soft, non-tender.  No rebound or guarding, bowel sounds present.  MSK:  All limbs are noted to be moving spontaneously  Integumentary:  Does not appear diaphoretic.  No pallor  Neuro:  Symmetric face.  Fluent speech.  Follows commands.  No focal motor deficits noted  Psych:  Cooperative.  Pleasant.  Does not appear to be responding to internal stimuli.  Normal mood      A/P  #??Sepsis secondary to bacteremia vs tick borne disease. ??  ????????????????????????- Blood Culture +??Group C strep - Sensitives pending  ????????????????????????- Continue Ceftriaxone - DAY??5/?  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   - TEE today  ??  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  ??  # Diet:????Regular  # DVT Prophylaxis:????Lovenox  # Code status:????Full code      Visit Vitals  BP 118/70 (BP 1 Location: Left upper arm, BP Patient Position: At rest)   Pulse 94   Temp 99.2 ??F (37.3 ??C)   Resp 18   Ht 5\' 9"  (1.753 m)   Wt 74.3 kg (163 lb 12.8 oz)   SpO2 95%   BMI 24.19 kg/m??      O2 Device: None (Room air)    Temp (24hrs), Avg:98.7 ??F (37.1 ??C), Min:97.9 ??F (36.6 ??C), Max:99.5 ??F (37.5 ??C)      11/18 0701 - 11/18 1900  In: 50 [I.V.:50]  Out:  -   11/16 1901 - 11/18 0700  In: 2709 [P.O.:2046]  Out: 6850 [Urine:6850]       I reviewed labs below:   Labs: Results:       Chemistry Recent Labs     08/29/20  0718 08/28/20  1049   GLU 105 91   NA 136 138   K 3.6 3.3*   CL 104 104   CO2 25 26   BUN 13 11   CREA 0.73 0.59   CA 7.8* 7.5*   AGAP 11 11   BUCR 18 19   AP 152* 167*   TP 6.1* 5.9*   ALB 2.3* 2.1*   GLOB 3.8 3.8   AGRAT 0.6 0.6      CBC w/Diff Recent Labs     08/29/20  0718 08/28/20  1049   WBC 16.5* 16.8*   RBC 4.21* 4.23*   HGB 12.3* 12.3*   HCT 35.0* 35.3*   PLT 209 136*   GRANS 55 67   LYMPH 27 19   EOS 1 0      Coagulation No results for input(s): PTP, INR, APTT, INREXT in the last  72 hours.    Liver Enzymes Recent Labs     08/29/20  0718   TP 6.1*   ALB 2.3*   AP 152*   resultrcnt{24h@       ~~~~~~~~~        Data Review    No results found for this or any previous visit (from the past 12 hour(s)).      Signed By: Antony Madura, DO     August 30, 2020       I apologize for any spelling or grammatical errors I may have overlooked while proof-reading.  If there are any questions or confusion please contact the hospitalist office at [phone]

## 2020-08-30 NOTE — Progress Notes (Signed)
Patient states that he has changed his mind about the TEE and has called out demanding food and a drink. This RN provided additional education to the patient regarding the procedure and its importance to his treatment plan. Patient continues to refuse despite education and reassurance, provider made aware and diet orders to be changed back to previous diet.

## 2020-08-30 NOTE — Anesthesia Pre-Procedure Evaluation (Signed)
Anesthesia Preprocedure Evaluation by Glean Salvo, CRNA at 08/30/20 1249                Author: Glean Salvo, CRNA  Service: Certified Registered Nurse Anesthetist  Author Type: Nurse Anesthetist       Filed: 08/30/20 1308  Date of Service: 08/30/20 1249  Status: Addendum          Editor: Glean Salvo, CRNA (Nurse Anesthetist)          Related Notes: Original Note by Glean Salvo, CRNA (Nurse Anesthetist) filed at 08/30/20  1307                 Relevant Problems       No relevant active problems           Anesthetic History    No history of anesthetic complications                 Review of Systems / Medical History   Patient summary reviewed, nursing notes reviewed and pertinent labs reviewed          Pulmonary               Smoker              Neuro/Psych                Pertinent negatives: Seizures: 0.5 ppd.   Comments:  Blind left eye  Cardiovascular   Within defined limits                        Exercise tolerance: >4 METS            GI/Hepatic/Renal   Within defined limits                     Endo/Other   Within defined limits                 Other Findings    Comments:  NPO status, allergies and medications reviewed      IV drug abuse  Polysubstance abuse                 Physical Exam          Airway   Mallampati: II   TM Distance: 4 - 6 cm   Neck ROM: normal range of motion    Mouth opening: Normal        Cardiovascular                       Dental      Dentition: Poor dentition              Pulmonary   Breath sounds clear to auscultation                       Abdominal    GI exam deferred           Other Findings                  Anesthetic Plan      ASA: 3   Anesthesia type: total IV anesthesia               Induction: Intravenous   Anesthetic plan and risks discussed with: Patient

## 2020-08-31 ENCOUNTER — Inpatient Hospital Stay: Admit: 2020-08-31 | Payer: MEDICAID

## 2020-08-31 LAB — C-REACTIVE PROTEIN: CRP: 3.2 mg/dL — ABNORMAL HIGH (ref 0.00–0.90)

## 2020-08-31 LAB — SEDIMENTATION RATE, AUTOMATED: Sed Rate: 91 MM/HR — ABNORMAL HIGH (ref 0–15)

## 2020-08-31 LAB — SED RATE, AUTOMATED: Sed rate, automated: 91 MM/HR — ABNORMAL HIGH (ref 0–15)

## 2020-08-31 LAB — C REACTIVE PROTEIN, QT: C-Reactive protein: 3.2 mg/dL — ABNORMAL HIGH (ref 0.00–0.90)

## 2020-08-31 MED ORDER — IOHEXOL 300 MG/ML IV SOLN
300 mg iodine/mL | Freq: Once | INTRAVENOUS | Status: AC
Start: 2020-08-31 — End: 2020-08-31
  Administered 2020-08-31: 22:00:00 via INTRAVENOUS

## 2020-08-31 MED ORDER — HYDROMORPHONE 0.5 MG/0.5 ML SYRINGE
0.5 mg/ mL | Freq: Once | INTRAMUSCULAR | Status: AC
Start: 2020-08-31 — End: 2020-08-31
  Administered 2020-08-31: 21:00:00 via INTRAVENOUS

## 2020-08-31 MED FILL — LORAZEPAM 1 MG TAB: 1 mg | ORAL | Qty: 1

## 2020-08-31 MED FILL — POLYETHYLENE GLYCOL 3350 17 GRAM (100 %) ORAL POWDER PACKET: 17 gram | ORAL | Qty: 1

## 2020-08-31 MED FILL — NICOTINE 14 MG/24 HR DAILY PATCH: 14 mg/24 hr | TRANSDERMAL | Qty: 1

## 2020-08-31 MED FILL — OXYCODONE 5 MG TAB: 5 mg | ORAL | Qty: 1

## 2020-08-31 MED FILL — MILK OF MAGNESIA 400 MG/5 ML ORAL SUSPENSION: 400 mg/5 mL | ORAL | Qty: 30

## 2020-08-31 MED FILL — CEFTRIAXONE 2 GRAM SOLUTION FOR INJECTION: 2 gram | INTRAMUSCULAR | Qty: 2

## 2020-08-31 MED FILL — ACETAMINOPHEN 325 MG TABLET: 325 mg | ORAL | Qty: 2

## 2020-08-31 MED FILL — OMNIPAQUE 300 MG IODINE/ML INTRAVENOUS SOLUTION: 300 mg iodine/mL | INTRAVENOUS | Qty: 80

## 2020-08-31 MED FILL — QUETIAPINE 100 MG TAB: 100 mg | ORAL | Qty: 1

## 2020-08-31 MED FILL — METHOCARBAMOL 500 MG TAB: 500 mg | ORAL | Qty: 2

## 2020-08-31 MED FILL — HYDROMORPHONE 0.5 MG/0.5 ML SYRINGE: 0.5 mg/ mL | INTRAMUSCULAR | Qty: 0.5

## 2020-08-31 MED FILL — NICOTINE (POLACRILEX) 2 MG GUM: 2 mg | BUCCAL | Qty: 1

## 2020-08-31 MED FILL — BUPRENORPHINE-NALOXONE 8 MG-2 MG SUBLINGUAL FILM: 8-2 mg | SUBLINGUAL | Qty: 1

## 2020-08-31 MED FILL — DOXEPIN 50 MG CAP: 50 mg | ORAL | Qty: 1

## 2020-08-31 MED FILL — ENOXAPARIN 40 MG/0.4 ML SUB-Q SYRINGE: 40 mg/0.4 mL | SUBCUTANEOUS | Qty: 0.4

## 2020-08-31 NOTE — Progress Notes (Signed)
Hospitalist Progress Note    Subjective:   CC: "My left hip and buttock still really hurts"    S: Has significant pain getting to a standing position.  Denies CP, SOB    O:  Physical Exam  Vitals: Reviewed  General:  No acute distress.  Nontoxic.  Alert.  Oriented x3  Eyes:  No scleral icterus.  No conjunctival injection.  Tracking with eyes.  Pupils equally round  HEENT:  Normocephalic. Atraumatic. No angioedema.  Voice is not muffled.  Neck:   No JVD.  No stridor  Cardiovascular:  Tachy.  No anasarca.  No lower extremity edema  Respiratory:  Speaks in full sentences.  No accessory muscle usage noted.  Work of breathing appears unlabored.  Acyanotic.  Lungs clear to auscultation bilaterally.  Abdomen/GI:  Soft, non-tender.  No rebound or guarding, bowel sounds present.  MSK:  All limbs are noted to be moving spontaneously.  Pain in L hip and buttock to palpitation  Integumentary:  Does not appear diaphoretic.  No pallor  Neuro:  Symmetric face.  Fluent speech.  Follows commands.  No focal motor deficits noted  Psych:  Cooperative.  Pleasant.  Does not appear to be responding to internal stimuli.  Normal mood      A/P  #??Sepsis secondary to bacteremia vs tick borne disease. ??  ????????????????????????- Blood Culture +??Group C strep??- Sensitive to Ceftriaxone.  Only 1 culture positive.  ????????????????????????- Continue Ceftriaxone - DAY??6/14              - TEE NEGATIVE for veggies.  ?? 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  ??  # Diet:????Regular  # DVT Prophylaxis:????Lovenox  # Code status:????Full code  ??           Visit Vitals  BP 102/68 (BP 1 Location: Right upper arm, BP Patient Position: Supine)   Pulse 98   Temp 99.2 ??F (37.3 ??C)   Resp 18   Ht 5\' 9"  (1.753 m)   Wt 74.3 kg (163 lb 12.8 oz)   SpO2 99%   BMI 24.19 kg/m??      O2 Device: None (Room air)    Temp  (24hrs), Avg:98.4 ??F (36.9 ??C), Min:97.9 ??F (36.6 ??C), Max:99.2 ??F (37.3 ??C)      11/19 0701 - 11/19 1900  In: 750 [P.O.:700; I.V.:50]  Out: 600 [Urine:600]  11/17 1901 - 11/19 0700  In: 350 [I.V.:350]  Out: 3550 [Urine:3550]       I reviewed labs below:   Labs: Results:       Chemistry Recent Labs     08/29/20  0718   GLU 105   NA 136   K 3.6   CL 104   CO2 25   BUN 13   CREA 0.73   CA 7.8*   AGAP 11   BUCR 18   AP 152*   TP 6.1*   ALB 2.3*   GLOB 3.8   AGRAT 0.6      CBC w/Diff Recent Labs     08/29/20  0718   WBC 16.5*   RBC 4.21*   HGB 12.3*   HCT 35.0*   PLT 209   GRANS 55   LYMPH 27   EOS 1  Coagulation No results for input(s): PTP, INR, APTT, INREXT in the last 72 hours.    Liver Enzymes Recent Labs     08/29/20  0718   TP 6.1*   ALB 2.3*   AP 152*   resultrcnt{24h@       ~~~~~~~~~        Data Review    Recent Results (from the past 12 hour(s))   SED RATE, AUTOMATED    Collection Time: 08/31/20  8:46 AM   Result Value Ref Range    Sed rate, automated 91 (H) 0 - 15 MM/HR   C REACTIVE PROTEIN, QT    Collection Time: 08/31/20  8:46 AM   Result Value Ref Range    C-Reactive protein 3.20 (H) 0.00 - 0.90 mg/dL         Signed By: Antony Madura, DO     August 31, 2020       I apologize for any spelling or grammatical errors I may have overlooked while proof-reading.  If there are any questions or confusion please contact the hospitalist office at [phone]

## 2020-08-31 NOTE — Progress Notes (Signed)
12:50 Pt stating pain in his right hip 8/10, patient medicated per MAR.     14:00: Pt asking for pain mediatation, patient medicated per MAR.     15:00 Pt asking for pain medication stating he has pain in his hip, pt medicated per [facility].     16:26: Pt reporting pain and going down to CT scan, pt medicated per [facility] with one time dose.

## 2020-08-31 NOTE — Progress Notes (Addendum)
Progress  Notes by Gaye Pollack, RN at 08/31/20 1538                Author: Gaye Pollack, RN  Service: --  Author Type: Care Management       Filed: 09/04/20 0902  Date of Service: 08/31/20 1538  Status: Addendum          Editor: Gaye Pollack, RN (Care Management)          Related Notes: Original Note by Gaye Pollack, RN (Care Management) filed at 09/04/20 [phone]               09-04-20 E-mail received from [facility]: Yes, he would need to present and they will come up with a plan with the patient to continue his treatment.      Megan C. Doristine Mango, CMA [facility])   Transition Coordinator                 08-31-20  E-mail sent to [facility] to see about pt continuing suboxone after discharge, waiting on response.

## 2020-08-31 NOTE — Progress Notes (Signed)
08/31/20 1136   Type and Reason for Visit   Type and Reason for Visit Initial; RD nutrition re-screen/LOS  (LOS)   Food/Nutrition-Related History/Allergies   Nutrition History and Allergies NFKA   Cultural/Religious/Ethnic Food Preference(s) None   Nutrition Assessment   Nutrition Assessment Pt is being assessed for hospital LOS. Pt is eating well at meals. No acute nutrition issues at this time.   Anthropometric Measures   Height 5\' 9"  (1.753 m)   Current Body Weight 74.3 kg (163 lb 12.8 oz)   Weight Source   (per chart)   Ideal Body Weight (lbs) (Calculated) 160 lbs   Ideal Body Weight (Kg) (Calculated) 73 kg   % IBW (Calculated) 102.4 %   BMI (calculated) 24.2   BMI Category Normal weight (BMI 18.5-24.9)   Nutrient-Energy (Caloric) Requirements   Mifflin-St. Jeor (Overwt/Obese Pts) (kcals/day) 1678.38   Weight Used for Energy Requirements Current   Energy Requirements Based On Kcal/kg  (22-25)   Total Energy Requirements (kcals/day) 3086-5784   Nutrient-Protein Requirements   Weight Used for Protein Requirements Current   Total Protein Requirements (g/day) 75-95  (1-1.3)   Fluid Requirements   Method Used for Fluid Requirements 1 ml/kcal   Total Fluid Requirements (ml/day) 6962-9528 or per MD   Current Oral Intake   Average Meal Intake   (76-100% x4 meals, 51-75% x1 meal)   Nutrition Diagnosis    Problem #1 No nutrition diagnosis at this time   Nutrition Intervention   Food and/or Nutrient Delivery Continue current diet  (encourage oral intakes as tolerated within the diet order)   Goals   Goals For pt to continue tolerating at least 75% of meals within the next 24 hrs   Monitoring and Evaluation   Food/Nutrient Intake Outcomes Food and nutrient intake   Physical Signs/Symptoms Outcomes Biochemical data; Weight; GI status   Nutrition Follow-Up Date   Nutrition Follow-up Date   (RA 11/24)

## 2020-08-31 NOTE — Progress Notes (Signed)
PCP:   [facility]   Not listed in Epic, ticket submitted to update

## 2020-08-31 NOTE — Progress Notes (Signed)
08/30/20 1100   Consult Information   Reason for Consult Initial visit   Spiritual Care Volunteer Visit   (Doug Guerrette)   Time In 1100   Pastoral Interventions   Patient Interventions Prayer (actual); Spiritual care volunteer support

## 2020-09-01 LAB — CBC WITH AUTO DIFFERENTIAL
Eosinophils %: 2 %
Eosinophils Absolute: 0.3 10*3/uL (ref 0.0–0.5)
Granulocyte Absolute Count: 0.7 10*3/uL — ABNORMAL HIGH (ref 0.0–0.1)
Hematocrit: 38.3 % — ABNORMAL LOW (ref 42.0–52.0)
Hemoglobin: 12.7 g/dL — ABNORMAL LOW (ref 14.0–18.0)
Lymphocytes %: 29 %
Lymphocytes Absolute: 3.9 10*3/uL (ref 1.0–4.5)
MCH: 29.3 PG (ref 28.0–34.0)
MCHC: 33.2 g/dL (ref 32.0–36.0)
MCV: 88.5 FL (ref 80.0–100.0)
MPV: 9.5 FL (ref 7.0–12.0)
Metamyelocytes Relative: 1 %
Monocytes %: 12 %
Monocytes Absolute: 1.6 10*3/uL — ABNORMAL HIGH (ref 0.1–0.8)
Myelocyte Percent: 4 %
Neutrophils %: 52 %
Neutrophils Absolute: 7 10*3/uL (ref 1.9–7.8)
Platelets: 376 10*3/uL (ref 150–400)
RBC: 4.33 M/uL — ABNORMAL LOW (ref 4.50–6.00)
RDW: 14.3 % — ABNORMAL HIGH (ref 11.5–13.5)
WBC: 13.5 10*3/uL — ABNORMAL HIGH (ref 4.8–10.8)

## 2020-09-01 LAB — CULTURE, BLOOD,  2ND DRAW
Culture result:: NO GROWTH
Culture: NO GROWTH

## 2020-09-01 LAB — C-REACTIVE PROTEIN: CRP: 2.4 mg/dL — ABNORMAL HIGH (ref 0.00–0.90)

## 2020-09-01 LAB — SEDIMENTATION RATE, AUTOMATED: Sed Rate: 101 MM/HR — ABNORMAL HIGH (ref 0–15)

## 2020-09-01 LAB — CBC WITH AUTOMATED DIFF
ABS. EOSINOPHILS: 0.3 10*3/uL (ref 0.0–0.5)
ABS. IMM. GRANS.: 0.7 10*3/uL — ABNORMAL HIGH (ref 0.0–0.1)
ABS. LYMPHOCYTES: 3.9 10*3/uL (ref 1.0–4.5)
ABS. MONOCYTES: 1.6 10*3/uL — ABNORMAL HIGH (ref 0.1–0.8)
ABS. NEUTROPHILS: 7 10*3/uL (ref 1.9–7.8)
EOSINOPHILS: 2 %
HCT: 38.3 % — ABNORMAL LOW (ref 42.0–52.0)
HGB: 12.7 g/dL — ABNORMAL LOW (ref 14.0–18.0)
LYMPHOCYTES: 29 %
MCH: 29.3 PG (ref 28.0–34.0)
MCHC: 33.2 g/dL (ref 32.0–36.0)
MCV: 88.5 FL (ref 80.0–100.0)
METAMYELOCYTES: 1 %
MONOCYTES: 12 %
MPV: 9.5 FL (ref 7.0–12.0)
MYELOCYTES: 4 %
NEUTROPHILS: 52 %
PLATELET: 376 10*3/uL (ref 150–400)
RBC: 4.33 M/uL — ABNORMAL LOW (ref 4.50–6.00)
RDW: 14.3 % — ABNORMAL HIGH (ref 11.5–13.5)
WBC: 13.5 10*3/uL — ABNORMAL HIGH (ref 4.8–10.8)

## 2020-09-01 LAB — CULTURE, BLOOD 3RD SET: Culture result:: NO GROWTH

## 2020-09-01 LAB — SED RATE, AUTOMATED: Sed rate, automated: 101 MM/HR — ABNORMAL HIGH (ref 0–15)

## 2020-09-01 LAB — C REACTIVE PROTEIN, QT: C-Reactive protein: 2.4 mg/dL — ABNORMAL HIGH (ref 0.00–0.90)

## 2020-09-01 MED ORDER — PREDNISONE 20 MG TAB
20 mg | Freq: Every day | ORAL | Status: DC
Start: 2020-09-01 — End: 2020-09-05
  Administered 2020-09-01 – 2020-09-04 (×4): via ORAL

## 2020-09-01 MED FILL — QUETIAPINE 100 MG TAB: 100 mg | ORAL | Qty: 1

## 2020-09-01 MED FILL — LORAZEPAM 1 MG TAB: 1 mg | ORAL | Qty: 1

## 2020-09-01 MED FILL — NICOTINE 14 MG/24 HR DAILY PATCH: 14 mg/24 hr | TRANSDERMAL | Qty: 1

## 2020-09-01 MED FILL — POLYETHYLENE GLYCOL 3350 17 GRAM (100 %) ORAL POWDER PACKET: 17 gram | ORAL | Qty: 1

## 2020-09-01 MED FILL — ENOXAPARIN 40 MG/0.4 ML SUB-Q SYRINGE: 40 mg/0.4 mL | SUBCUTANEOUS | Qty: 0.4

## 2020-09-01 MED FILL — CEFTRIAXONE 2 GRAM SOLUTION FOR INJECTION: 2 gram | INTRAMUSCULAR | Qty: 2

## 2020-09-01 MED FILL — OXYCODONE 5 MG TAB: 5 mg | ORAL | Qty: 1

## 2020-09-01 MED FILL — CLONIDINE 0.1 MG TAB: 0.1 mg | ORAL | Qty: 1

## 2020-09-01 MED FILL — BUPRENORPHINE-NALOXONE 8 MG-2 MG SUBLINGUAL FILM: 8-2 mg | SUBLINGUAL | Qty: 1

## 2020-09-01 MED FILL — MILK OF MAGNESIA 400 MG/5 ML ORAL SUSPENSION: 400 mg/5 mL | ORAL | Qty: 30

## 2020-09-01 MED FILL — PREDNISONE 20 MG TAB: 20 mg | ORAL | Qty: 2

## 2020-09-01 MED FILL — METHOCARBAMOL 500 MG TAB: 500 mg | ORAL | Qty: 2

## 2020-09-01 NOTE — Progress Notes (Signed)
Problem: Pain  Goal: *Control of Pain  Outcome: Progressing Towards Goal

## 2020-09-01 NOTE — Progress Notes (Signed)
Pt sitting up in bed eating [place] sandwich. Pain in left hip 7/10, requesting pain meds when available. Administered scheduled medications per MAR.   Alert and oriented x 4 and cooperative with care. Assessment completed as charted. Denies any needs at this time. Will continue frequent checks.     2207 Pain in left hip 7/10, administered prn oxycodone per MAR.

## 2020-09-01 NOTE — Progress Notes (Signed)
Hospitalist Progress Note    Subjective:   CC: "Still having back pain."    S: Denies fever, chills.      O:  Physical Exam  Vitals: Reviewed  General:  No acute distress.  Nontoxic.  Alert.  Oriented x3  Eyes:  No scleral icterus.  No conjunctival injection.  Tracking with eyes.  Pupils equally round  HEENT:  Normocephalic. Atraumatic. No angioedema.  Voice is not muffled.  Neck:   No JVD.  No stridor  Cardiovascular:  Tachy.  No anasarca.  No lower extremity edema  Respiratory:  Speaks in full sentences.  No accessory muscle usage noted.  Work of breathing appears unlabored.  Acyanotic.  Lungs clear to auscultation bilaterally.  Abdomen/GI:  Soft, non-tender.  No rebound or guarding, bowel sounds present.  MSK:  All limbs are noted to be moving spontaneously.  Pain in L hip and buttock to palpitation.  Mostly along the L SI joint  Integumentary:  Does not appear diaphoretic.  No pallor  Neuro:  Symmetric face.  Fluent speech.  Follows commands.  No focal motor deficits noted  Psych:  Cooperative.  Pleasant.  Does not appear to be responding to internal stimuli.  Normal mood    A/P  #??Sepsis secondary to bacteremia vs tick borne disease. ??  ????????????????????????- Blood Culture +??Group C strep??- Sensitive to Ceftriaxone.  Only 1 culture positive.  ????????????????????????- Continue Ceftriaxone - DAY??7/14  ????????????????????????- TEE NEGATIVE for veggies.  ??           - With pain in L hip/buttock will reimage to see if an abscess has recurred  ??  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    #SI Joint pain - suspect sacroilitis - will start Prednisone 40mg  qDAY  ??  # Diet:????Regular  # DVT Prophylaxis:????Lovenox  # Code status:????Full code           Visit Vitals  BP 111/73 (BP 1 Location: Left arm)   Pulse (!) 104   Temp 98.8 ??F (37.1 ??C)   Resp 18   Ht 5\' 9"  (1.753 m)   Wt 70 kg (154 lb 5.2 oz)   SpO2 93%   BMI 22.79 kg/m??      O2 Device: None (Room  air)    Temp (24hrs), Avg:98.5 ??F (36.9 ??C), Min:98.2 ??F (36.8 ??C), Max:99 ??F (37.2 ??C)      11/20 0701 - 11/20 1900  In: 530 [P.O.:480; I.V.:50]  Out: 1300 [Urine:1300]  11/18 1901 - 11/20 0700  In: 5110 [P.O.:5060; I.V.:50]  Out: 4650 [Urine:4650]       I reviewed labs below:   Labs: Results:       Chemistry No results for input(s): GLU, NA, K, CL, CO2, BUN, CREA, CA, AGAP, BUCR, TBIL, AP, TP, ALB, GLOB, AGRAT in the last 72 hours.    No lab exists for component: GPT   CBC w/Diff Recent Labs     09/01/20  0700   WBC 13.5*   RBC 4.33*   HGB 12.7*   HCT 38.3*   PLT 376   GRANS PENDING   LYMPH PENDING   EOS PENDING      Coagulation No results for input(s): PTP, INR, APTT, INREXT in the last 72 hours.    Liver Enzymes No results for input(s): TP, ALB, TBIL, AP in the last 72 hours.    No lab  exists for component: SGOT, GPT, DBILresultrcnt{24h@       ~~~~~~~~~        Data Review    Recent Results (from the past 12 hour(s))   SED RATE, AUTOMATED    Collection Time: 09/01/20  7:00 AM   Result Value Ref Range    Sed rate, automated 101 (H) 0 - 15 MM/HR   C REACTIVE PROTEIN, QT    Collection Time: 09/01/20  7:00 AM   Result Value Ref Range    C-Reactive protein 2.40 (H) 0.00 - 0.90 mg/dL   CBC WITH AUTOMATED DIFF    Collection Time: 09/01/20  7:00 AM   Result Value Ref Range    WBC 13.5 (H) 4.8 - 10.8 K/uL    RBC 4.33 (L) 4.50 - 6.00 M/uL    HGB 12.7 (L) 14.0 - 18.0 g/dL    HCT 29.9 (L) 37.1 - 52.0 %    MCV 88.5 80.0 - 100.0 FL    MCH 29.3 28.0 - 34.0 PG    MCHC 33.2 32.0 - 36.0 g/dL    RDW 69.6 (H) 78.9 - 13.5 %    PLATELET 376 150 - 400 K/uL    MPV 9.5 7.0 - 12.0 FL    NEUTROPHILS PENDING %    LYMPHOCYTES PENDING %    MONOCYTES PENDING %    EOSINOPHILS PENDING %    BASOPHILS PENDING %    ABS. NEUTROPHILS PENDING K/UL    ABS. LYMPHOCYTES PENDING K/UL    ABS. MONOCYTES PENDING K/UL    ABS. EOSINOPHILS PENDING K/UL    ABS. BASOPHILS PENDING K/UL    DF PENDING          Signed By: Antony Madura, DO     September 01, 2020       I apologize for any spelling or grammatical errors I may have overlooked while proof-reading.  If there are any questions or confusion please contact the hospitalist office at [phone]

## 2020-09-01 NOTE — Progress Notes (Signed)
Quiet day today. Enjoys eating as much as he can!  Walked in hallway with one person assist and walker x ~100 feet and tolerated with slight limp, but with tolerable pain. Continues to request oxycodone for pain ~ q4-5 hours with stated relief.   Problem: Falls - Risk of  Goal: *Absence of Falls  Description: Document Bridgette Habermann Fall Risk and appropriate interventions in the flowsheet.  Outcome: Progressing Towards Goal  Note: Fall Risk Interventions:  Mobility Interventions: Bed/chair exit alarm, PT Consult for assist device competence         Medication Interventions: Teach patient to arise slowly, Bed/chair exit alarm    Elimination Interventions: Bed/chair exit alarm, Patient to call for help with toileting needs    History of Falls Interventions: Bed/chair exit alarm, Door open when patient unattended         Problem: Patient Education: Go to Patient Education Activity  Goal: Patient/Family Education  Outcome: Progressing Towards Goal     Problem: Pain  Goal: *Control of Pain  Outcome: Progressing Towards Goal     Problem: Patient Education: Go to Patient Education Activity  Goal: Patient/Family Education  Outcome: Progressing Towards Goal     Problem: General Infection Care Plan (Adult and Pediatric)  Goal: Improvement in signs and symptoms of infection  Outcome: Progressing Towards Goal  Goal: *Optimize nutritional status  Outcome: Progressing Towards Goal     Problem: Patient Education: Go to Patient Education Activity  Goal: Patient/Family Education  Outcome: Progressing Towards Goal

## 2020-09-02 MED FILL — METHOCARBAMOL 500 MG TAB: 500 mg | ORAL | Qty: 2

## 2020-09-02 MED FILL — BUPRENORPHINE-NALOXONE 8 MG-2 MG SUBLINGUAL FILM: 8-2 mg | SUBLINGUAL | Qty: 1

## 2020-09-02 MED FILL — OXYCODONE 5 MG TAB: 5 mg | ORAL | Qty: 1

## 2020-09-02 MED FILL — MILK OF MAGNESIA 400 MG/5 ML ORAL SUSPENSION: 400 mg/5 mL | ORAL | Qty: 30

## 2020-09-02 MED FILL — NICOTINE 14 MG/24 HR DAILY PATCH: 14 mg/24 hr | TRANSDERMAL | Qty: 1

## 2020-09-02 MED FILL — PREDNISONE 20 MG TAB: 20 mg | ORAL | Qty: 2

## 2020-09-02 MED FILL — CEFTRIAXONE 2 GRAM SOLUTION FOR INJECTION: 2 gram | INTRAMUSCULAR | Qty: 2

## 2020-09-02 MED FILL — NICOTINE (POLACRILEX) 2 MG GUM: 2 mg | BUCCAL | Qty: 1

## 2020-09-02 MED FILL — QUETIAPINE 100 MG TAB: 100 mg | ORAL | Qty: 1

## 2020-09-02 MED FILL — LORAZEPAM 1 MG TAB: 1 mg | ORAL | Qty: 1

## 2020-09-02 MED FILL — ENOXAPARIN 40 MG/0.4 ML SUB-Q SYRINGE: 40 mg/0.4 mL | SUBCUTANEOUS | Qty: 0.4

## 2020-09-02 MED FILL — ACETAMINOPHEN 325 MG TABLET: 325 mg | ORAL | Qty: 2

## 2020-09-02 MED FILL — POLYETHYLENE GLYCOL 3350 17 GRAM (100 %) ORAL POWDER PACKET: 17 gram | ORAL | Qty: 1

## 2020-09-02 NOTE — Progress Notes (Signed)
Pt alert and oriented x4. IV site benign. Pt pleasant and cooperative with all care.     1610: Pt requesting ativan for anxiety    1133: Pt c/o of pain in left leg, oxycodone given     1301: Pt states pain is better but would like some robaxin and ativan for his anxiety. WCTM

## 2020-09-02 NOTE — Progress Notes (Signed)
Problem: Falls - Risk of  Goal: *Absence of Falls  Description: Document Paul George Fall Risk and appropriate interventions in the flowsheet.  Outcome: Progressing Towards Goal  Note: Fall Risk Interventions:  Mobility Interventions: Communicate number of staff needed for ambulation/transfer         Medication Interventions: Patient to call before getting OOB, Teach patient to arise slowly    Elimination Interventions: Call light in reach, Patient to call for help with toileting needs    History of Falls Interventions: Consult care management for discharge planning         Problem: Pain  Goal: *Control of Pain  Outcome: Progressing Towards Goal

## 2020-09-02 NOTE — Progress Notes (Signed)
Hospitalist Progress Note    Subjective:   CC: "I feel good except my back"    S: Continues to have pain in L lumbar/sacrum area.  Denies fever/chill.s     O:  Physical Exam  Vitals: Reviewed  General:  No acute distress.  Nontoxic.  Alert.  Oriented x3  Eyes:  No scleral icterus.  No conjunctival injection.  Tracking with eyes.  Pupils equally round  HEENT:  Normocephalic. Atraumatic. No angioedema.  Voice is not muffled.  Neck:   No JVD.  No stridor  Cardiovascular:  Regular rate and rhythm.  No anasarca.  No lower extremity edema  Respiratory:  Speaks in full sentences.  No accessory muscle usage noted.  Work of breathing appears unlabored.  Acyanotic.  Lungs clear to auscultation bilaterally.  Abdomen/GI:  Soft, non-tender.  No rebound or guarding, bowel sounds present.  MSK:  All limbs are noted to be moving spontaneously  Integumentary:  Does not appear diaphoretic.  No pallor  Neuro:  Symmetric face.  Fluent speech.  Follows commands.  No focal motor deficits noted  Psych:  Cooperative.  Pleasant.  Does not appear to be responding to internal stimuli.  Normal mood      A/P  #??Sepsis secondary to bacteremia vs tick borne disease. ??  ????????????????????????- Blood Culture +??Group C strep??- Sensitive to Ceftriaxone. ??Only 1 culture positive.  ????????????????????????- Continue Ceftriaxone - DAY??8/14 - unless there is something found on L spine MRI  ??  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  - Wants to be off maintainance meds when he leaves the hospital.  Offered to wean suboxone today but he wants to know how long he will be inpatient before starting the wean.    ??  #SI Joint pain - suspect sacroilitis - will start Prednisone 40mg  qDAY   - Re-image w/ MRI tomorrow    #Hep C - elevated viral count.   - Discussed new therapy that can be done as an outpt.   - He is interested in therapy for it and in maintaining sobriety.  ??  #  Diet:????Regular  # DVT Prophylaxis:????Lovenox  # Code status:????Full code     Visit Vitals  BP 104/62 (BP 1 Location: Left upper arm, BP Patient Position: At rest;Lying)   Pulse 87   Temp 97.3 ??F (36.3 ??C)   Resp 18   Ht 5\' 9"  (1.753 m)   Wt 70 kg (154 lb 5.2 oz)   SpO2 94%   BMI 22.79 kg/m??      O2 Device: None (Room air)    Temp (24hrs), Avg:98.4 ??F (36.9 ??C), Min:96.8 ??F (36 ??C), Max:99.3 ??F (37.4 ??C)      11/21 0701 - 11/21 1900  In: 660 [P.O.:660]  Out: -   11/19 1901 - 11/21 0700  In: 5950 [P.O.:5900; I.V.:50]  Out: 5300 [Urine:5300]       I reviewed labs below:   Labs: Results:       Chemistry No results for input(s): GLU, NA, K, CL, CO2, BUN, CREA, CA, AGAP, BUCR, TBIL, AP, TP, ALB, GLOB, AGRAT in the last 72 hours.    No lab exists for component: GPT   CBC w/Diff Recent Labs     09/01/20  0700   WBC 13.5*   RBC 4.33*   HGB 12.7*   HCT 38.3*   PLT 376   GRANS  52   LYMPH 29   EOS 2      Coagulation No results for input(s): PTP, INR, APTT, INREXT in the last 72 hours.    Liver Enzymes No results for input(s): TP, ALB, TBIL, AP in the last 72 hours.    No lab exists for component: SGOT, GPT, DBILresultrcnt{24h@       ~~~~~~~~~        Data Review    No results found for this or any previous visit (from the past 12 hour(s)).      Signed By: Antony Madura, DO     September 02, 2020       I apologize for any spelling or grammatical errors I may have overlooked while proof-reading.  If there are any questions or confusion please contact the hospitalist office at [phone]

## 2020-09-03 ENCOUNTER — Inpatient Hospital Stay: Admit: 2020-09-03 | Payer: MEDICAID

## 2020-09-03 MED ORDER — LORAZEPAM 1 MG TAB
1 mg | Freq: Once | ORAL | Status: AC
Start: 2020-09-03 — End: 2020-09-02
  Administered 2020-09-03: 02:00:00 via ORAL

## 2020-09-03 MED ORDER — GADOBUTROL 10 MMOL/10 ML (1 MMOL/ML) IV
10 mmol/ mL (1 mmol/mL) | Freq: Once | INTRAVENOUS | Status: AC
Start: 2020-09-03 — End: 2020-09-03
  Administered 2020-09-03: 13:00:00 via INTRAVENOUS

## 2020-09-03 MED FILL — MILK OF MAGNESIA 400 MG/5 ML ORAL SUSPENSION: 400 mg/5 mL | ORAL | Qty: 30

## 2020-09-03 MED FILL — POLYETHYLENE GLYCOL 3350 17 GRAM (100 %) ORAL POWDER PACKET: 17 gram | ORAL | Qty: 1

## 2020-09-03 MED FILL — OXYCODONE 5 MG TAB: 5 mg | ORAL | Qty: 1

## 2020-09-03 MED FILL — GADAVIST 10 MMOL/10 ML (1 MMOL/ML) INTRAVENOUS SOLUTION: 10 mmol/ mL (1 mmol/mL) | INTRAVENOUS | Qty: 6

## 2020-09-03 MED FILL — METHOCARBAMOL 500 MG TAB: 500 mg | ORAL | Qty: 2

## 2020-09-03 MED FILL — NICOTINE 14 MG/24 HR DAILY PATCH: 14 mg/24 hr | TRANSDERMAL | Qty: 1

## 2020-09-03 MED FILL — LORAZEPAM 1 MG TAB: 1 mg | ORAL | Qty: 1

## 2020-09-03 MED FILL — CEFTRIAXONE 2 GRAM SOLUTION FOR INJECTION: 2 gram | INTRAMUSCULAR | Qty: 2

## 2020-09-03 MED FILL — NICOTINE (POLACRILEX) 2 MG GUM: 2 mg | BUCCAL | Qty: 1

## 2020-09-03 MED FILL — PREDNISONE 20 MG TAB: 20 mg | ORAL | Qty: 2

## 2020-09-03 MED FILL — QUETIAPINE 100 MG TAB: 100 mg | ORAL | Qty: 1

## 2020-09-03 MED FILL — BUPRENORPHINE-NALOXONE 8 MG-2 MG SUBLINGUAL FILM: 8-2 mg | SUBLINGUAL | Qty: 1

## 2020-09-03 NOTE — Progress Notes (Signed)
Follow-up Information     Follow up With Specialties Details Why Contact Info    Beverely Pace, FNP Nurse Practitioner On 09/13/2020 Hospital Follow-up  10:45 [address]  [place] [address]  [phone]

## 2020-09-03 NOTE — Progress Notes (Signed)
Spoke with RN who confirms patient is up ad lib and ambulating independently in his room. Patient has no acute care PT needs at this time, will cancel per protocol.    Aris Lot, PT

## 2020-09-03 NOTE — Progress Notes (Signed)
This Clinical research associate spoke with supervisor Bonner Puna regarding patient's IVDU history and need for high risk protocol. Chart to be reviewed.

## 2020-09-03 NOTE — Progress Notes (Signed)
Pt alert and oriented. Pleasant and cooperative. Pt took medications without difficulty. WCTM

## 2020-09-03 NOTE — Progress Notes (Signed)
Hospitalist Progress Note      Daily Progress Note: 09/03/2020 8:54 PM    Subjective:     Patient states he feels ok.  Denies fever and chills.  Complains of lower back pain.       Current Facility-Administered Medications   Medication Dose Route Frequency   ??? predniSONE (DELTASONE) tablet 40 mg  40 mg Oral DAILY WITH BREAKFAST   ??? QUEtiapine (SEROquel) tablet 100 mg  100 mg Oral QHS   ??? LORazepam (ATIVAN) tablet 1 mg  1 mg Oral Q4H PRN   ??? nicotine (NICODERM CQ) 14 mg/24 hr patch 1 Patch  1 Patch TransDERmal DAILY   ??? buprenorphine-naloxone (SUBOXONE) 8mg -2mg  SL film  1 Film SubLINGual DAILY   ??? enoxaparin (LOVENOX) injection 40 mg  40 mg SubCUTAneous Q24H   ??? nicotine (NICORETTE) gum 2 mg  2 mg Oral Q2H PRN   ??? magnesium hydroxide (MILK OF MAGNESIA) 400 mg/5 mL oral suspension 30 mL  30 mL Oral DAILY   ??? LORazepam (ATIVAN) injection 1 mg  1 mg IntraVENous Q4H PRN   ??? sodium chloride (NS) flush 3-10 mL  3-10 mL IntraVENous Q12H   ??? sodium chloride (NS) flush 3-10 mL  3-10 mL IntraVENous PRN   ??? naloxone (NARCAN) injection 0.4 mg  0.4 mg IntraVENous PRN   ??? acetaminophen (TYLENOL) tablet 650 mg  650 mg Oral Q4H PRN    Or   ??? acetaminophen (TYLENOL) solution 650 mg  650 mg Oral Q4H PRN    Or   ??? acetaminophen (TYLENOL) suppository 650 mg  650 mg Rectal Q4H PRN   ??? oxyCODONE IR (ROXICODONE) tablet 5 mg  5 mg Oral Q4H PRN   ??? ondansetron (ZOFRAN) injection 4 mg  4 mg IntraVENous Q6H PRN    Or   ??? ondansetron (ZOFRAN ODT) tablet 4 mg  4 mg Oral Q6H PRN   ??? polyethylene glycol (MIRALAX) packet 17 g  17 g Oral DAILY   ??? dicyclomine (BENTYL) capsule 20 mg  20 mg Oral TID PRN   ??? loperamide (IMODIUM) capsule 2 mg  2 mg Oral PRN   ??? cloNIDine HCL (CATAPRES) tablet 0.1 mg  0.1 mg Oral Q4H PRN   ??? methocarbamoL (ROBAXIN) tablet 750 mg  750 mg Oral Q6H PRN   ??? cefTRIAXone (ROCEPHIN) 2 g in 0.9% sodium chloride (MBP/ADV) 50 mL MBP  2 g IntraVENous Q24H            Objective:         Visit Vitals  BP 130/68 (BP 1 Location:  Left upper arm, BP Patient Position: At rest;Supine)   Pulse 90   Temp 97.3 ??F (36.3 ??C)   Resp 16   Ht 5\' 9"  (1.753 m)   Wt 70 kg (154 lb 5.2 oz)   SpO2 96%   BMI 22.79 kg/m??      O2 Device: None (Room air)    Temp (24hrs), Avg:98.1 ??F (36.7 ??C), Min:97.3 ??F (36.3 ??C), Max:98.8 ??F (37.1 ??C)          Physical Exam  General: No acute distress  HEENT: Normocephalic, atraumatic  Neck: Supple  CV: Regular rate and rhythm, S1 and S2 are normal, no murmurs/rubs/or  gallops  Lungs: Clear to auscultation bilaterally, no rales/rhonchi/wheezes  Abdomen: soft, non-distended, non-tender, bowel sounds present  EXT: No edema  Neuro: Cranial nerve II through XII intact  Skin: Intact, no rashes, no lesions, no errythema.    ??  Additional  comments: I reviewed the patient's new clinical lab test results.        Labs    No results found for this or any previous visit (from the past 24 hour(s)).    Assessment/Plan:     Active Problems:    Sepsis (HCC) (08/27/2020)        #Sepsis secondary to bacteremia versus tick-borne disease  Blood culture is positive for group C strep and sensitive to ceftriaxone (only 1 bottle was positive and the 2nd bottle is pending)  Follow-up blood culture results  F/u MRSA nares   Continue Rocephin day 9 of 14  MRI of the lumbar spine shows sacral ileitis with a small 1 cm intrapelvic abscess.  The case was discussed with radiology and the abscess is not big enough to be drained or accessed appropriately.  Consider [facility] with [facility] at [facility]     #Active IV drug user with risks of withdrawal from opiates and amphetamines  COWS  Continue Suboxone (patient is requesting long-term sobriety)  The patient wants to be off maintenance meds when he leaves the hospital  Consider weaning Suboxone    #Sacroiliac joint pain  Sacroiliitis seen on MRI  Continue prednisone 40 mg daily    #Hepatitis-C  Elevated viral count  Consider outpatient treatment    Venous thromboembolism  prophylaxis:  Lovenox  Code:  Full          Signed By: Lennice Sites, MD     September 03, 2020

## 2020-09-03 NOTE — Progress Notes (Signed)
Problem: Falls - Risk of  Goal: *Absence of Falls  Description: Document Paul George Fall Risk and appropriate interventions in the flowsheet.  Outcome: Progressing Towards Goal  Note: Fall Risk Interventions:  Mobility Interventions: Communicate number of staff needed for ambulation/transfer, Patient to call before getting OOB         Medication Interventions: Bed/chair exit alarm, Teach patient to arise slowly    Elimination Interventions: Call light in reach    History of Falls Interventions: Consult care management for discharge planning, Room close to nurse's station         Problem: Patient Education: Go to Patient Education Activity  Goal: Patient/Family Education  Outcome: Progressing Towards Goal     Problem: Pain  Goal: *Control of Pain  Outcome: Progressing Towards Goal     Problem: Patient Education: Go to Patient Education Activity  Goal: Patient/Family Education  Outcome: Progressing Towards Goal     Problem: General Infection Care Plan (Adult and Pediatric)  Goal: Improvement in signs and symptoms of infection  Outcome: Progressing Towards Goal  Goal: *Optimize nutritional status  Outcome: Progressing Towards Goal     Problem: Patient Education: Go to Patient Education Activity  Goal: Patient/Family Education  Outcome: Progressing Towards Goal

## 2020-09-03 NOTE — Progress Notes (Signed)
09/03/20 1100   Consult Information   Reason for Consult Follow-up, routine   Spiritual Care Volunteer Visit   [facility])   Time In 1100   Pastoral Interventions   Patient Interventions Other (comment)  (Left card)

## 2020-09-04 LAB — CBC WITH AUTO DIFFERENTIAL
Basophils %: 0 %
Basophils Absolute: 0.1 10*3/uL (ref 0.0–0.2)
Eosinophils %: 0 %
Eosinophils Absolute: 0 10*3/uL (ref 0.0–0.5)
Granulocyte Absolute Count: 0.1 10*3/uL (ref 0.0–0.1)
Hematocrit: 38.7 % — ABNORMAL LOW (ref 42.0–52.0)
Hemoglobin: 12.6 g/dL — ABNORMAL LOW (ref 14.0–18.0)
Immature Granulocytes: 1 %
Lymphocytes %: 30 %
Lymphocytes Absolute: 3.6 10*3/uL (ref 1.0–4.5)
MCH: 29.4 PG (ref 28.0–34.0)
MCHC: 32.6 g/dL (ref 32.0–36.0)
MCV: 90.4 FL (ref 80.0–100.0)
MPV: 9.6 FL (ref 7.0–12.0)
Monocytes %: 6 %
Monocytes Absolute: 0.7 10*3/uL (ref 0.1–0.8)
NRBC Absolute: 0 10*3/uL
Neutrophils %: 63 %
Neutrophils Absolute: 7.4 10*3/uL (ref 1.9–7.8)
Nucleated RBCs: 0 PER 100 WBC
Platelets: 423 10*3/uL — ABNORMAL HIGH (ref 150–400)
RBC: 4.28 M/uL — ABNORMAL LOW (ref 4.50–6.00)
RDW: 14.5 % — ABNORMAL HIGH (ref 11.5–13.5)
WBC: 11.8 10*3/uL — ABNORMAL HIGH (ref 4.8–10.8)

## 2020-09-04 LAB — BASIC METABOLIC PANEL
Anion Gap: 14 mmol/L
BUN: 16 MG/DL (ref 7–20)
Bun/Cre Ratio: 23 NA
CO2: 28 mmol/L (ref 20–32)
Calcium: 8.3 MG/DL — ABNORMAL LOW (ref 8.8–10.5)
Chloride: 99 mmol/L — ABNORMAL LOW (ref 100–110)
Creatinine: 0.7 MG/DL (ref 0.40–1.20)
EGFR IF NonAfrican American: >60 mL/min/{1.73_m2} (ref >60)
GFR African American: >60 mL/min/{1.73_m2} (ref >60)
Glucose: 89 mg/dL (ref 75–110)
Potassium: 3.7 mmol/L (ref 3.5–5.0)
Sodium: 137 mmol/L (ref 135–145)

## 2020-09-04 LAB — IRON AND TIBC
Iron Saturation: 41 % (ref 11–46)
Iron: 142 ug/dL (ref 50–175)
TIBC: 348 ug/dL (ref 250–400)

## 2020-09-04 LAB — CRP, HIGH SENSITIVITY
CRP High Sensitivity: 9.05 mg/L — ABNORMAL HIGH (ref 0.000–2.000)
CRP, High sensitivity: 9.05 mg/L — ABNORMAL HIGH (ref 0.000–2.000)

## 2020-09-04 LAB — MAGNESIUM
Magnesium: 2.1 mg/dL (ref 1.7–2.5)
Magnesium: 2.1 mg/dL (ref 1.7–2.5)

## 2020-09-04 LAB — FERRITIN
Ferritin: 229.3 ng/mL (ref 30.0–335.0)
Ferritin: 229.3 ng/mL (ref 30.0–335.0)

## 2020-09-04 LAB — PROCALCITONIN
Procalcitonin: <0.5 ng/mL (ref 0.0–0.5)
Procalcitonin: <0.5 ng/mL (ref 0.0–0.5)

## 2020-09-04 LAB — CBC WITH AUTOMATED DIFF
ABS. BASOPHILS: 0.1 10*3/uL (ref 0.0–0.2)
ABS. EOSINOPHILS: 0 10*3/uL (ref 0.0–0.5)
ABS. IMM. GRANS.: 0.1 10*3/uL (ref 0.0–0.1)
ABS. LYMPHOCYTES: 3.6 10*3/uL (ref 1.0–4.5)
ABS. MONOCYTES: 0.7 10*3/uL (ref 0.1–0.8)
ABS. NEUTROPHILS: 7.4 10*3/uL (ref 1.9–7.8)
ABSOLUTE NRBC: 0 10*3/uL
BASOPHILS: 0 %
EOSINOPHILS: 0 %
HCT: 38.7 % — ABNORMAL LOW (ref 42.0–52.0)
HGB: 12.6 g/dL — ABNORMAL LOW (ref 14.0–18.0)
IMMATURE GRANULOCYTES: 1 %
LYMPHOCYTES: 30 %
MCH: 29.4 PG (ref 28.0–34.0)
MCHC: 32.6 g/dL (ref 32.0–36.0)
MCV: 90.4 FL (ref 80.0–100.0)
MONOCYTES: 6 %
MPV: 9.6 FL (ref 7.0–12.0)
NEUTROPHILS: 63 %
NRBC: 0 PER 100 WBC
PLATELET: 423 10*3/uL — ABNORMAL HIGH (ref 150–400)
RBC: 4.28 M/uL — ABNORMAL LOW (ref 4.50–6.00)
RDW: 14.5 % — ABNORMAL HIGH (ref 11.5–13.5)
WBC: 11.8 10*3/uL — ABNORMAL HIGH (ref 4.8–10.8)

## 2020-09-04 LAB — METABOLIC PANEL, BASIC
Anion gap: 14 mmol/L
BUN/Creatinine ratio: 23
BUN: 16 MG/DL (ref 7–20)
CO2: 28 mmol/L (ref 20–32)
Calcium: 8.3 MG/DL — ABNORMAL LOW (ref 8.8–10.5)
Chloride: 99 mmol/L — ABNORMAL LOW (ref 100–110)
Creatinine: 0.7 MG/DL (ref 0.40–1.20)
GFR est AA: >60 mL/min/{1.73_m2} (ref >60)
GFR est non-AA: >60 mL/min/{1.73_m2} (ref >60)
Glucose: 89 mg/dL (ref 75–110)
Potassium: 3.7 mmol/L (ref 3.5–5.0)
Sodium: 137 mmol/L (ref 135–145)

## 2020-09-04 LAB — IRON PROFILE
Iron % saturation: 41 % (ref 11–46)
Iron: 142 ug/dL (ref 50–175)
TIBC: 348 ug/dL (ref 250–400)

## 2020-09-04 MED ORDER — OXYCODONE 5 MG TAB
5 mg | ORAL | Status: DC | PRN
Start: 2020-09-04 — End: 2020-09-05

## 2020-09-04 MED FILL — NICOTINE 14 MG/24 HR DAILY PATCH: 14 mg/24 hr | TRANSDERMAL | Qty: 1

## 2020-09-04 MED FILL — METHOCARBAMOL 500 MG TAB: 500 mg | ORAL | Qty: 2

## 2020-09-04 MED FILL — CEFTRIAXONE 2 GRAM SOLUTION FOR INJECTION: 2 gram | INTRAMUSCULAR | Qty: 2

## 2020-09-04 MED FILL — LORAZEPAM 1 MG TAB: 1 mg | ORAL | Qty: 1

## 2020-09-04 MED FILL — OXYCODONE 5 MG TAB: 5 mg | ORAL | Qty: 1

## 2020-09-04 MED FILL — QUETIAPINE 100 MG TAB: 100 mg | ORAL | Qty: 1

## 2020-09-04 MED FILL — POLYETHYLENE GLYCOL 3350 17 GRAM (100 %) ORAL POWDER PACKET: 17 gram | ORAL | Qty: 1

## 2020-09-04 MED FILL — BUPRENORPHINE-NALOXONE 8 MG-2 MG SUBLINGUAL FILM: 8-2 mg | SUBLINGUAL | Qty: 1

## 2020-09-04 MED FILL — ENOXAPARIN 40 MG/0.4 ML SUB-Q SYRINGE: 40 mg/0.4 mL | SUBCUTANEOUS | Qty: 0.4

## 2020-09-04 MED FILL — NICOTINE (POLACRILEX) 2 MG GUM: 2 mg | BUCCAL | Qty: 1

## 2020-09-04 MED FILL — PREDNISONE 20 MG TAB: 20 mg | ORAL | Qty: 2

## 2020-09-04 MED FILL — MILK OF MAGNESIA 400 MG/5 ML ORAL SUSPENSION: 400 mg/5 mL | ORAL | Qty: 30

## 2020-09-04 NOTE — Discharge Summary (Signed)
Physician Discharge Summary       Patient: Paul George MRN: [birth year]  SSN: [id number]    Date of Birth: 09/29/1986  Age: 34 y.o.  Sex: male    PCP: Iona Coach, FNP    Admit date: 08/26/2020  Admitting Provider: Evalyn Casco, MD    Discharge date: 09/05/2020  Discharging Provider: Ronda Fairly, MD    * Admission Diagnoses: Sepsis [facility]) [A41.9]    * Discharge Diagnoses:    Hospital Problems as of 09/04/2020 Date Reviewed: Sep 06, 2020          Codes Class Noted - Resolved POA    Sepsis ([place]) ICD-10-CM: A41.9  ICD-9-CM: 038.9, 995.91  08/27/2020 - Present Yes              * Hospital Course:   As per the HPI of the H&P:  Paul George is a 34 y.o. male with IVDU, who presents to the ED complaining of feeling unwell for about 4-5 days.  The patient provides vague complaints and is not very forthcoming during history taking.  He reports feeling weak, fatigued and having pain all over his arms and legs.  He has had nausea and subjective fever.  He reports having cough which is occasionally productive.  Denies any back pain, chest pain, shortness of breath, abdominal pain, vomiting, or diarrhea.  He is an active IV drug user and reports shooting what ever he gets including heroin and methamphetamine.  Last use was yesterday before he came to the ED.  He denies any known tick bite or rashes in the skin.  ??  In the ED upon presentation, he was hemodynamically stable.  But he was noted to be warm to touch as per the ED physician. Lab work was notable for WBC of 16.2, platelets of 69, elevated ESR and CRP, serum sodium of 124 with elevated LFTs.  Urine drug screen was positive for amphetamines and THC.  COVID 19 and flu panel were negative.  He was noncompliant with medical recommendations in the ED and refused blood draws for blood cultures as well as new IV access for CT scan with IV contrast.  He ultimately had chest x-ray and CT abdomen pelvis without contrast.  Chest x-ray had no acute findings while  CT abdomen pelvis showed large amount of stool in the colon with no other acute findings and a 5 mm soft tissue nodule in the right middle lobe. He was given fluid bolus along with a dose of cefepime and vancomycin, and referred for inpatient care for presumed sepsis of unclear origin.     Hospital course:  The patient was admitted with sepsis secondary to bacteremia and is found to have sacraliliitis.  Blood culture was positive for group C strep.  And was sensitive to Rocephin.  Only 1 bottle was positive and the 2nd bottle is still pending.  The patient received 10 days of a planned 14 days of Rocephin.  MRI of the lumbar spine shows sacral ileitis with a small 1 cm intrapelvic abscess.  The case was discussed with radiology and the abscess is not big enough to be drained or accessed appropriately.  The patient has a history of IV drug abuse and was on Suboxone.  The patient signed against medical advice at 9:15 p.m. on 09/05/2020.  I was not present to evaluate the patient prior to him signing against medical advice.      ??  ??    Discharge Exam:  Patient signed [facility]  Time spent on discharge was  15 min.  Signed AMA not on my shift.      Follow-up Information     Follow up With Specialties Details Why Contact Info    Enis Gash, [place] Nurse Practitioner On 09/13/2020 Hospital Follow-up  10:45 [address]  [place] [address]  [phone]         Present to the [facility] for suboxone treatment, [address] [phone]             Signed:  Ronda Fairly, MD  09/05/2020  2:32 PM

## 2020-09-04 NOTE — Progress Notes (Signed)
Pt left AMA. Pt did not allow this nurse to give him a shift assessment and refused to take any medication this evening. Provider and house supervisor notified. This nurse went over the benefits of staying and the risks of leaving. Pt continues to be adament about leaving despite knowing his risks and benefits. PIV line removed. Security escorted patient out the door. No further needs at this time.

## 2020-09-04 NOTE — Progress Notes (Signed)
Hospitalist Progress Note      Daily Progress Note: 09/04/2020 8:37 PM    Subjective:     Patient states he feels ok.  Denies fever and chills.         Current Facility-Administered Medications   Medication Dose Route Frequency   ??? oxyCODONE IR (ROXICODONE) tablet 10 mg  10 mg Oral Q4H PRN   ??? predniSONE (DELTASONE) tablet 40 mg  40 mg Oral DAILY WITH BREAKFAST   ??? QUEtiapine (SEROquel) tablet 100 mg  100 mg Oral QHS   ??? LORazepam (ATIVAN) tablet 1 mg  1 mg Oral Q4H PRN   ??? nicotine (NICODERM CQ) 14 mg/24 hr patch 1 Patch  1 Patch TransDERmal DAILY   ??? buprenorphine-naloxone (SUBOXONE) 8mg -2mg  SL film  1 Film SubLINGual DAILY   ??? enoxaparin (LOVENOX) injection 40 mg  40 mg SubCUTAneous Q24H   ??? nicotine (NICORETTE) gum 2 mg  2 mg Oral Q2H PRN   ??? magnesium hydroxide (MILK OF MAGNESIA) 400 mg/5 mL oral suspension 30 mL  30 mL Oral DAILY   ??? LORazepam (ATIVAN) injection 1 mg  1 mg IntraVENous Q4H PRN   ??? sodium chloride (NS) flush 3-10 mL  3-10 mL IntraVENous Q12H   ??? sodium chloride (NS) flush 3-10 mL  3-10 mL IntraVENous PRN   ??? naloxone (NARCAN) injection 0.4 mg  0.4 mg IntraVENous PRN   ??? acetaminophen (TYLENOL) tablet 650 mg  650 mg Oral Q4H PRN    Or   ??? acetaminophen (TYLENOL) solution 650 mg  650 mg Oral Q4H PRN    Or   ??? acetaminophen (TYLENOL) suppository 650 mg  650 mg Rectal Q4H PRN   ??? ondansetron (ZOFRAN) injection 4 mg  4 mg IntraVENous Q6H PRN    Or   ??? ondansetron (ZOFRAN ODT) tablet 4 mg  4 mg Oral Q6H PRN   ??? polyethylene glycol (MIRALAX) packet 17 g  17 g Oral DAILY   ??? dicyclomine (BENTYL) capsule 20 mg  20 mg Oral TID PRN   ??? loperamide (IMODIUM) capsule 2 mg  2 mg Oral PRN   ??? cloNIDine HCL (CATAPRES) tablet 0.1 mg  0.1 mg Oral Q4H PRN   ??? methocarbamoL (ROBAXIN) tablet 750 mg  750 mg Oral Q6H PRN   ??? cefTRIAXone (ROCEPHIN) 2 g in 0.9% sodium chloride (MBP/ADV) 50 mL MBP  2 g IntraVENous Q24H            Objective:         Visit Vitals  BP 130/77 (BP 1 Location: Left upper arm, BP Patient  Position: At rest)   Pulse 90   Temp 98 ??F (36.7 ??C)   Resp 18   Ht 5\' 9"  (1.753 m)   Wt 70 kg (154 lb 5.2 oz)   SpO2 93%   BMI 22.79 kg/m??      O2 Device: None (Room air)    Temp (24hrs), Avg:98.1 ??F (36.7 ??C), Min:97.7 ??F (36.5 ??C), Max:98.6 ??F (37 ??C)          Physical Exam  General: No acute distress  HEENT: Normocephalic, atraumatic  Neck: Supple  CV: Regular rate and rhythm, S1 and S2 are normal, no murmurs/rubs/or  gallops  Lungs: Clear to auscultation bilaterally, no rales/rhonchi/wheezes  Abdomen: soft, non-distended, non-tender, bowel sounds present  EXT: No edema  Neuro: Cranial nerve II through XII intact  Skin: Intact, no rashes, no lesions, no errythema.    ??  Additional comments: I reviewed the  patient's new clinical lab test results.        Labs    Recent Results (from the past 24 hour(s))   METABOLIC PANEL, BASIC    Collection Time: 09/04/20  8:33 AM   Result Value Ref Range    Sodium 137 135 - 145 mmol/L    Potassium 3.7 3.5 - 5.0 mmol/L    Chloride 99 (L) 100 - 110 mmol/L    CO2 28 20 - 32 mmol/L    Anion gap 14 mmol/L    Glucose 89 75 - 110 mg/dL    BUN 16 7 - 20 MG/DL    Creatinine 5.36 6.44 - 1.20 MG/DL    BUN/Creatinine ratio 23     GFR est AA >60 >60 ml/min/1.58m2    GFR est non-AA >60 >60 ml/min/1.40m2    Calcium 8.3 (L) 8.8 - 10.5 MG/DL   IRON PROFILE    Collection Time: 09/04/20  8:33 AM   Result Value Ref Range    Iron 142 50 - 175 ug/dL    Iron % saturation 41 11 - 46 %    TIBC 348 250 - 400 ug/dL   FERRITIN    Collection Time: 09/04/20  8:33 AM   Result Value Ref Range    Ferritin 229.3 30.0 - 335.0 ng/mL   MAGNESIUM    Collection Time: 09/04/20  8:33 AM   Result Value Ref Range    Magnesium 2.1 1.7 - 2.5 mg/dL   PROCALCITONIN    Collection Time: 09/04/20  8:33 AM   Result Value Ref Range    Procalcitonin <0.5 0.0 - 0.5 ng/mL   CRP, HIGH SENSITIVITY    Collection Time: 09/04/20  8:33 AM   Result Value Ref Range    CRP, High sensitivity 9.050 (H) 0.000 - 2.000 mg/L   CBC WITH AUTOMATED  DIFF    Collection Time: 09/04/20  9:52 AM   Result Value Ref Range    WBC 11.8 (H) 4.8 - 10.8 K/uL    RBC 4.28 (L) 4.50 - 6.00 M/uL    HGB 12.6 (L) 14.0 - 18.0 g/dL    HCT 03.4 (L) 74.2 - 52.0 %    MCV 90.4 80.0 - 100.0 FL    MCH 29.4 28.0 - 34.0 PG    MCHC 32.6 32.0 - 36.0 g/dL    RDW 59.5 (H) 63.8 - 13.5 %    PLATELET 423 (H) 150 - 400 K/uL    MPV 9.6 7.0 - 12.0 FL    NEUTROPHILS 63 %    LYMPHOCYTES 30 %    MONOCYTES 6 %    EOSINOPHILS 0 %    BASOPHILS 0 %    IMMATURE GRANULOCYTES 1 %    ABS. NEUTROPHILS 7.4 1.9 - 7.8 K/UL    ABS. LYMPHOCYTES 3.6 1.0 - 4.5 K/UL    ABS. MONOCYTES 0.7 0.1 - 0.8 K/UL    ABS. EOSINOPHILS 0.0 0.0 - 0.5 K/UL    ABS. BASOPHILS 0.1 0.0 - 0.2 K/UL    ABS. IMM. GRANS. 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  The case was discussed with radiology and the abscess is not big enough to be drained or accessed appropriately.  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

## 2020-09-04 NOTE — Progress Notes (Signed)
Problem: Falls - Risk of  Goal: *Absence of Falls  Description: Document Paul George Fall Risk and appropriate interventions in the flowsheet.  Outcome: Progressing Towards Goal  Note: Fall Risk Interventions:  Mobility Interventions: Communicate number of staff needed for ambulation/transfer         Medication Interventions: Bed/chair exit alarm    Elimination Interventions: Call light in reach    History of Falls Interventions: Consult care management for discharge planning         Problem: Patient Education: Go to Patient Education Activity  Goal: Patient/Family Education  Outcome: Progressing Towards Goal     Problem: Patient Education: Go to Patient Education Activity  Goal: Patient/Family Education  Outcome: Progressing Towards Goal     Problem: Pain  Goal: *Control of Pain  Outcome: Progressing Towards Goal     Problem: General Infection Care Plan (Adult and Pediatric)  Goal: Improvement in signs and symptoms of infection  Outcome: Progressing Towards Goal  Goal: *Optimize nutritional status  Outcome: Progressing Towards Goal     Problem: Patient Education: Go to Patient Education Activity  Goal: Patient/Family Education  Outcome: Progressing Towards Goal

## 2020-09-04 NOTE — Progress Notes (Signed)
Pt alert and oriented x4. Medicated per mar for pain control and anxiety. Pleasant and cooperative will care. Pt encouraged to call out if anything needed. WCTM

## 2020-09-04 NOTE — Progress Notes (Signed)
Pt signed out against medical advice.

## 2020-09-04 NOTE — Progress Notes (Signed)
Problem: Falls - Risk of  Goal: *Absence of Falls  Description: Document Paul George Fall Risk and appropriate interventions in the flowsheet.  Outcome: Progressing Towards Goal  Note: Fall Risk Interventions:  Mobility Interventions: Communicate number of staff needed for ambulation/transfer, Bed/chair exit alarm, Patient to call before getting OOB         Medication Interventions: Teach patient to arise slowly, Patient to call before getting OOB, Bed/chair exit alarm    Elimination Interventions: Bed/chair exit alarm, Call light in reach, Patient to call for help with toileting needs    History of Falls Interventions: Consult care management for discharge planning, Bed/chair exit alarm, Door open when patient unattended         Problem: Pain  Goal: *Control of Pain  Outcome: Progressing Towards Goal

## 2020-09-05 LAB — HIV 1/2 AG AND AB SCREEN

## 2020-09-07 LAB — CULTURE, BLOOD 1: Culture: NO GROWTH

## 2020-09-07 LAB — CULTURE, BLOOD,  2ND DRAW
Culture result:: NO GROWTH
Culture: NO GROWTH

## 2020-09-07 LAB — CULTURE, BLOOD: Culture result:: NO GROWTH

## 2020-09-21 ENCOUNTER — Emergency Department: Admit: 2020-09-21 | Payer: MEDICAID

## 2020-09-21 ENCOUNTER — Inpatient Hospital Stay
Admit: 2020-09-21 | Discharge: 2020-09-27 | Disposition: A | Discharge status: Home / Self Care | Payer: MEDICAID | Attending: Internal Medicine | Admitting: Internal Medicine

## 2020-09-21 DIAGNOSIS — M461 Sacroiliitis, not elsewhere classified: Secondary | ICD-10-CM

## 2020-09-21 LAB — CBC WITH AUTO DIFFERENTIAL
Basophils %: 1 %
Basophils Absolute: 0.1 10*3/uL (ref 0.0–0.2)
Eosinophils %: 1 %
Eosinophils Absolute: 0.1 10*3/uL (ref 0.0–0.5)
Granulocyte Absolute Count: 0.2 10*3/uL — ABNORMAL HIGH (ref 0.0–0.1)
Hematocrit: 35.9 % — ABNORMAL LOW (ref 42.0–52.0)
Hemoglobin: 12.1 g/dL — ABNORMAL LOW (ref 14.0–18.0)
Immature Granulocytes: 1 %
Lymphocytes %: 20 %
Lymphocytes Absolute: 2.4 10*3/uL (ref 1.0–4.5)
MCH: 29.2 PG (ref 28.0–34.0)
MCHC: 33.7 g/dL (ref 32.0–36.0)
MCV: 86.7 FL (ref 80.0–100.0)
MPV: 9.1 FL (ref 7.0–12.0)
Monocytes %: 7 %
Monocytes Absolute: 0.8 10*3/uL (ref 0.1–0.8)
NRBC Absolute: 0 10*3/uL
Neutrophils %: 70 %
Neutrophils Absolute: 8.5 10*3/uL — ABNORMAL HIGH (ref 1.9–7.8)
Nucleated RBCs: 0 PER 100 WBC
Platelets: 412 10*3/uL — ABNORMAL HIGH (ref 150–400)
RBC: 4.14 M/uL — ABNORMAL LOW (ref 4.50–6.00)
RDW: 13.2 % (ref 11.5–13.5)
WBC: 12 10*3/uL — ABNORMAL HIGH (ref 4.8–10.8)

## 2020-09-21 LAB — COMPREHENSIVE METABOLIC PANEL
ALT: 22 U/L (ref 3–35)
AST: 17 U/L (ref 15–40)
Albumin/Globulin Ratio: 0.6
Albumin: 3.1 g/dL — ABNORMAL LOW (ref 3.5–5.0)
Alkaline Phosphatase: 58 U/L (ref 35–100)
Anion Gap: 14 mmol/L
BUN: 12 MG/DL (ref 7–20)
Bun/Cre Ratio: 17 NA
CO2: 28 mmol/L (ref 20–32)
Calcium: 8.6 MG/DL — ABNORMAL LOW (ref 8.8–10.5)
Chloride: 97 mmol/L — ABNORMAL LOW (ref 100–110)
Creatinine: 0.71 MG/DL (ref 0.40–1.20)
EGFR IF NonAfrican American: >60 mL/min/{1.73_m2} (ref >60)
GFR African American: >60 mL/min/{1.73_m2} (ref >60)
Globulin: 5.1 g/dL
Glucose: 101 mg/dL (ref 75–110)
Potassium: 3.8 mmol/L (ref 3.5–5.0)
Sodium: 135 mmol/L (ref 135–145)
Total Bilirubin: 0.9 mg/dL (ref 0.10–1.20)
Total Protein: 8.2 g/dL — ABNORMAL HIGH (ref 6.2–8.0)

## 2020-09-21 LAB — COVID-19 & INFLUENZA COMBO
RSV By PCR: NEGATIVE
Rapid Influenza A By PCR: NEGATIVE
Rapid Influenza B By PCR: NEGATIVE
SARS-CoV-2: POSITIVE — CR

## 2020-09-21 LAB — SEDIMENTATION RATE, AUTOMATED: Sed Rate: 101 MM/HR — ABNORMAL HIGH (ref 0–15)

## 2020-09-21 LAB — C-REACTIVE PROTEIN: CRP: 16.6 mg/dL — ABNORMAL HIGH (ref 0.00–0.90)

## 2020-09-21 LAB — CBC WITH AUTOMATED DIFF
ABS. BASOPHILS: 0.1 10*3/uL (ref 0.0–0.2)
ABS. EOSINOPHILS: 0.1 10*3/uL (ref 0.0–0.5)
ABS. IMM. GRANS.: 0.2 10*3/uL — ABNORMAL HIGH (ref 0.0–0.1)
ABS. LYMPHOCYTES: 2.4 10*3/uL (ref 1.0–4.5)
ABS. MONOCYTES: 0.8 10*3/uL (ref 0.1–0.8)
ABS. NEUTROPHILS: 8.5 10*3/uL — ABNORMAL HIGH (ref 1.9–7.8)
ABSOLUTE NRBC: 0 10*3/uL
BASOPHILS: 1 %
EOSINOPHILS: 1 %
HCT: 35.9 % — ABNORMAL LOW (ref 42.0–52.0)
HGB: 12.1 g/dL — ABNORMAL LOW (ref 14.0–18.0)
IMMATURE GRANULOCYTES: 1 %
LYMPHOCYTES: 20 %
MCH: 29.2 PG (ref 28.0–34.0)
MCHC: 33.7 g/dL (ref 32.0–36.0)
MCV: 86.7 FL (ref 80.0–100.0)
MONOCYTES: 7 %
MPV: 9.1 FL (ref 7.0–12.0)
NEUTROPHILS: 70 %
NRBC: 0 PER 100 WBC
PLATELET: 412 10*3/uL — ABNORMAL HIGH (ref 150–400)
RBC: 4.14 M/uL — ABNORMAL LOW (ref 4.50–6.00)
RDW: 13.2 % (ref 11.5–13.5)
WBC: 12 10*3/uL — ABNORMAL HIGH (ref 4.8–10.8)

## 2020-09-21 LAB — COVID-19,INFLUENZA A/B,RSV PANEL
Influenza A by PCR: NEGATIVE
Influenza B by PCR: NEGATIVE
RSV by PCR: NEGATIVE
SARS-CoV-2 by PCR: POSITIVE — CR

## 2020-09-21 LAB — METABOLIC PANEL, COMPREHENSIVE
A-G Ratio: 0.6
ALT (SGPT): 22 U/L (ref 3–35)
AST (SGOT): 17 U/L (ref 15–40)
Albumin: 3.1 g/dL — ABNORMAL LOW (ref 3.5–5.0)
Alk. phosphatase: 58 U/L (ref 35–100)
Anion gap: 14 mmol/L
BUN/Creatinine ratio: 17
BUN: 12 MG/DL (ref 7–20)
Bilirubin, total: 0.9 mg/dL (ref 0.10–1.20)
CO2: 28 mmol/L (ref 20–32)
Calcium: 8.6 MG/DL — ABNORMAL LOW (ref 8.8–10.5)
Chloride: 97 mmol/L — ABNORMAL LOW (ref 100–110)
Creatinine: 0.71 MG/DL (ref 0.40–1.20)
GFR est AA: >60 mL/min/{1.73_m2} (ref >60)
GFR est non-AA: >60 mL/min/{1.73_m2} (ref >60)
Globulin: 5.1 g/dL
Glucose: 101 mg/dL (ref 75–110)
Potassium: 3.8 mmol/L (ref 3.5–5.0)
Protein, total: 8.2 g/dL — ABNORMAL HIGH (ref 6.2–8.0)
Sodium: 135 mmol/L (ref 135–145)

## 2020-09-21 LAB — C REACTIVE PROTEIN, QT: C-Reactive protein: 16.6 mg/dL — ABNORMAL HIGH (ref 0.00–0.90)

## 2020-09-21 LAB — SED RATE, AUTOMATED: Sed rate, automated: 101 MM/HR — ABNORMAL HIGH (ref 0–15)

## 2020-09-21 MED ORDER — MORPHINE 2 MG/ML INJECTION
2 mg/mL | INTRAMUSCULAR | Status: AC | PRN
Start: 2020-09-21 — End: 2020-09-21
  Administered 2020-09-21 – 2020-09-22 (×4): via INTRAVENOUS

## 2020-09-21 MED ORDER — ACETAMINOPHEN 650 MG RECTAL SUPPOSITORY
650 mg | RECTAL | Status: DC | PRN
Start: 2020-09-21 — End: 2020-09-27

## 2020-09-21 MED ORDER — NALOXONE 0.4 MG/ML INJECTION
0.4 mg/mL | INTRAMUSCULAR | Status: DC | PRN
Start: 2020-09-21 — End: 2020-09-27

## 2020-09-21 MED ORDER — HYDROMORPHONE 2 MG TAB
2 mg | ORAL | Status: DC | PRN
Start: 2020-09-21 — End: 2020-09-24
  Administered 2020-09-21 – 2020-09-24 (×17): via ORAL

## 2020-09-21 MED ORDER — BISACODYL 5 MG TAB, DELAYED RELEASE
5 mg | Freq: Every day | ORAL | Status: DC | PRN
Start: 2020-09-21 — End: 2020-09-27

## 2020-09-21 MED ORDER — SODIUM CHLORIDE 0.9 % IV
Freq: Once | INTRAVENOUS | Status: AC
Start: 2020-09-21 — End: 2020-09-21
  Administered 2020-09-21: 13:00:00 via INTRAVENOUS

## 2020-09-21 MED ORDER — ONDANSETRON (PF) 4 MG/2 ML INJECTION
4 mg/2 mL | Freq: Four times a day (QID) | INTRAMUSCULAR | Status: DC | PRN
Start: 2020-09-21 — End: 2020-09-27

## 2020-09-21 MED ORDER — DICYCLOMINE 10 MG CAP
10 mg | Freq: Four times a day (QID) | ORAL | Status: DC | PRN
Start: 2020-09-21 — End: 2020-09-27

## 2020-09-21 MED ORDER — MAGNESIUM HYDROXIDE 400 MG/5 ML ORAL SUSP
400 mg/5 mL | Freq: Every day | ORAL | Status: DC | PRN
Start: 2020-09-21 — End: 2020-09-27

## 2020-09-21 MED ORDER — SODIUM CHLORIDE 0.9 % IJ SYRG
INTRAMUSCULAR | Status: DC | PRN
Start: 2020-09-21 — End: 2020-09-27

## 2020-09-21 MED ORDER — ACETAMINOPHEN (TYLENOL) SOLUTION 32MG/ML
ORAL | Status: DC | PRN
Start: 2020-09-21 — End: 2020-09-27

## 2020-09-21 MED ORDER — ACETAMINOPHEN 325 MG TABLET
325 mg | ORAL | Status: DC | PRN
Start: 2020-09-21 — End: 2020-09-27
  Administered 2020-09-23: 05:00:00 via ORAL

## 2020-09-21 MED ORDER — SODIUM CHLORIDE 0.9 % IV PIGGY BACK
1 gram | INTRAVENOUS | Status: AC
Start: 2020-09-21 — End: 2020-09-21
  Administered 2020-09-21: 23:00:00 via INTRAVENOUS

## 2020-09-21 MED ORDER — CEFTRIAXONE 2 GRAM SOLUTION FOR INJECTION
2 gram | INTRAMUSCULAR | Status: DC
Start: 2020-09-21 — End: 2020-09-24
  Administered 2020-09-22 – 2020-09-23 (×2): via INTRAVENOUS

## 2020-09-21 MED ORDER — SODIUM CHLORIDE 0.9 % IJ SYRG
Freq: Two times a day (BID) | INTRAMUSCULAR | Status: DC
Start: 2020-09-21 — End: 2020-09-26
  Administered 2020-09-22 – 2020-09-24 (×4): via INTRAVENOUS

## 2020-09-21 MED ORDER — CEFTRIAXONE 2 GRAM SOLUTION FOR INJECTION
2 gram | INTRAMUSCULAR | Status: DC
Start: 2020-09-21 — End: 2020-09-21

## 2020-09-21 MED ORDER — GADOBUTROL 10 MMOL/10 ML (1 MMOL/ML) IV
10 mmol/ mL (1 mmol/mL) | Freq: Once | INTRAVENOUS | Status: AC
Start: 2020-09-21 — End: 2020-09-21
  Administered 2020-09-21: 14:00:00 via INTRAVENOUS

## 2020-09-21 MED ORDER — ONDANSETRON 4 MG TAB, RAPID DISSOLVE
4 mg | Freq: Four times a day (QID) | ORAL | Status: DC | PRN
Start: 2020-09-21 — End: 2020-09-27

## 2020-09-21 MED ORDER — ENOXAPARIN 40 MG/0.4 ML SUB-Q SYRINGE
40 mg/0.4 mL | SUBCUTANEOUS | Status: DC
Start: 2020-09-21 — End: 2020-09-27

## 2020-09-21 MED ORDER — DIPHENHYDRAMINE HCL 50 MG/ML IJ SOLN
50 mg/mL | INTRAMUSCULAR | Status: DC | PRN
Start: 2020-09-21 — End: 2020-09-27
  Administered 2020-09-22: 04:00:00 via INTRAVENOUS

## 2020-09-21 MED ORDER — SODIUM CHLORIDE 0.9 % IV PIGGY BACK
1 gram | INTRAVENOUS | Status: AC
Start: 2020-09-21 — End: 2020-09-21
  Administered 2020-09-21: 16:00:00 via INTRAVENOUS

## 2020-09-21 MED FILL — MORPHINE 2 MG/ML INJECTION: 2 mg/mL | INTRAMUSCULAR | Qty: 2

## 2020-09-21 MED FILL — MILK OF MAGNESIA 400 MG/5 ML ORAL SUSPENSION: 400 mg/5 mL | ORAL | Qty: 30

## 2020-09-21 MED FILL — GADAVIST 10 MMOL/10 ML (1 MMOL/ML) INTRAVENOUS SOLUTION: 10 mmol/ mL (1 mmol/mL) | INTRAVENOUS | Qty: 6

## 2020-09-21 MED FILL — BD POSIFLUSH NORMAL SALINE 0.9 % INJECTION SYRINGE: INTRAMUSCULAR | Qty: 10

## 2020-09-21 MED FILL — CEFTRIAXONE 1 GRAM SOLUTION FOR INJECTION: 1 gram | INTRAMUSCULAR | Qty: 1

## 2020-09-21 MED FILL — HYDROMORPHONE 2 MG TAB: 2 mg | ORAL | Qty: 1

## 2020-09-21 MED FILL — SODIUM CHLORIDE 0.9 % IV: INTRAVENOUS | Qty: 1000

## 2020-09-21 NOTE — ED Notes (Signed)
Left hip, lower back, buttocks pain. This has been going on for the last month or so, getting worse.     Pt. States he has been here before and had an infection and this feel similar.     Denies fevers

## 2020-09-21 NOTE — Assessment & Plan Note (Signed)
Social Service following  Peripheral IV access is difficult due to IVDU  We are managing without an IV and response to quinolone has been good

## 2020-09-21 NOTE — H&P (Signed)
History and Physical    Subjective:     Paul George??is a 34 y.o.??male??with IVDU,??who??presents to the ED complaining of feeling unwell for about 4-5 days and with exacerbation of left sided sacroiliac pain. He left the hospital against medical advice on 09/05/20 prior to completing his course of Ceftriaxone. Prior studies include x-rays and MRI which were reviewed. He noted improvement while in the hospital and currently notes worsening of low back and sacral pain. He denies fever, nausea, vomiting. 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 He had Group C beta hemolytic streptococcus bacteremia on prior admission. Blood cultures were obtained in the ER. Walking makes symptoms worse, rest makes symptoms better. He has been living in small trailer home. Pain is generally 7/10. Pt denies any bladder or bowel dysfunction, radicular pain, saddle anesthesia.    Advance Directive: Step father is healthcare proxy, unable to recall phone number    Past Medical History:   Diagnosis Date   ??? Blind hypertensive left eye    ??? IV drug abuse ([place])    ??? Polysubstance abuse ([place])       History reviewed. No pertinent surgical history.  Family History   Family history unknown: Yes      Social History     Tobacco Use   ??? Smoking status: Current Every Day Smoker     Packs/day: 1.00   ??? Smokeless tobacco: Not on file   Substance Use Topics   ??? Alcohol use: Not Currently       Prior to Admission medications    Not on File     No Known Allergies     Review of Systems:  Review of Systems   Constitutional: Negative.    HENT: Negative.    Eyes: Negative.    Respiratory: Negative.    Cardiovascular: Negative.    Gastrointestinal: Negative.    Genitourinary: Negative.    Musculoskeletal: Positive for back pain, joint pain and myalgias.   Skin: Negative.    Neurological: Negative for sensory change, focal weakness and weakness.   Endo/Heme/Allergies: Negative.    Psychiatric/Behavioral: Positive for  substance abuse.        Objective:     Intake and Output:    12/10 0701 - 12/10 1900  In: 2563 [I.V.:1050]  Out: -   No intake/output data recorded.    Physical Exam:   Visit Vitals  BP (!) 98/57   Pulse (!) 106   Temp 98.3 ??F (36.8 ??C)   Resp 18   Ht 5' 9" (1.753 m)   Wt 68 kg (150 lb)   SpO2 93%   BMI 22.15 kg/m??     Physical Exam  Constitutional:       General: He is not in acute distress.     Appearance: Normal appearance. He is not ill-appearing, toxic-appearing or diaphoretic.   HENT:      Head: Normocephalic.      Right Ear: External ear normal.      Left Ear: External ear normal.      Nose: Nose normal.      Mouth/Throat:      Mouth: Mucous membranes are moist.   Eyes:      General:         Left eye: Discharge (blind in left eye) present.  Cardiovascular:      Rate and Rhythm: Normal rate and regular rhythm.      Pulses: Normal pulses.      Heart  sounds: Normal heart sounds.   Pulmonary:      Effort: Pulmonary effort is normal.      Breath sounds: Normal breath sounds.   Abdominal:      General: Abdomen is flat. There is no distension.      Palpations: Abdomen is soft. There is no mass.      Tenderness: There is no abdominal tenderness.   Musculoskeletal:         General: Tenderness present. No swelling, deformity or signs of injury.      Cervical back: Normal range of motion and neck supple. No rigidity.      Right lower leg: No edema.      Left lower leg: No edema.   Skin:     General: Skin is warm.      Capillary Refill: Capillary refill takes less than 2 seconds.   Neurological:      General: No focal deficit present.      Mental Status: He is alert and oriented to person, place, and time. Mental status is at baseline.   Psychiatric:         Mood and Affect: Mood normal.         Behavior: Behavior normal.         Thought Content: Thought content normal.         Judgment: Judgment normal.      Tenderness at left SI joint, he is able to go from lying to sitting without significant distress        Data  Review:   Recent Results (from the past 24 hour(s))   CBC WITH AUTOMATED DIFF    Collection Time: 09/21/20  8:11 AM   Result Value Ref Range    WBC 12.0 (H) 4.8 - 10.8 K/uL    RBC 4.14 (L) 4.50 - 6.00 M/uL    HGB 12.1 (L) 14.0 - 18.0 g/dL    HCT 35.9 (L) 42.0 - 52.0 %    MCV 86.7 80.0 - 100.0 FL    MCH 29.2 28.0 - 34.0 PG    MCHC 33.7 32.0 - 36.0 g/dL    RDW 13.2 11.5 - 13.5 %    PLATELET 412 (H) 150 - 400 K/uL    MPV 9.1 7.0 - 12.0 FL    NEUTROPHILS 70 %    LYMPHOCYTES 20 %    MONOCYTES 7 %    EOSINOPHILS 1 %    BASOPHILS 1 %    IMMATURE GRANULOCYTES 1 %    ABS. NEUTROPHILS 8.5 (H) 1.9 - 7.8 K/UL    ABS. LYMPHOCYTES 2.4 1.0 - 4.5 K/UL    ABS. MONOCYTES 0.8 0.1 - 0.8 K/UL    ABS. EOSINOPHILS 0.1 0.0 - 0.5 K/UL    ABS. BASOPHILS 0.1 0.0 - 0.2 K/UL    ABS. IMM. GRANS. 0.2 (H) 0.0 - 0.1 K/UL    NRBC 0.0 PER 100 WBC    ABSOLUTE NRBC 0.00 K/uL    DF AUTOMATED    METABOLIC PANEL, COMPREHENSIVE    Collection Time: 09/21/20  8:11 AM   Result Value Ref Range    Sodium 135 135 - 145 mmol/L    Potassium 3.8 3.5 - 5.0 mmol/L    Chloride 97 (L) 100 - 110 mmol/L    CO2 28 20 - 32 mmol/L    Anion gap 14 mmol/L    Glucose 101 75 - 110 mg/dL    BUN 12 7 - 20 MG/DL    Creatinine 0.71  0.40 - 1.20 MG/DL    BUN/Creatinine ratio 17     GFR est AA >60 >60 ml/min/1.2m    GFR est non-AA >60 >60 ml/min/1.744m   Calcium 8.6 (L) 8.8 - 10.5 MG/DL    Bilirubin, total 0.90 0.10 - 1.20 mg/dL    ALT (SGPT) 22 3 - 35 U/L    AST (SGOT) 17 15 - 40 U/L    Alk. phosphatase 58 35 - 100 U/L    Protein, total 8.2 (H) 6.2 - 8.0 g/dL    Albumin 3.1 (L) 3.5 - 5.0 g/dL    Globulin 5.1 g/dL    A-G Ratio 0.6     SED RATE, AUTOMATED    Collection Time: 09/21/20  8:11 AM   Result Value Ref Range    Sed rate, automated 101 (H) 0 - 15 MM/HR   C REACTIVE PROTEIN, QT    Collection Time: 09/21/20  8:11 AM   Result Value Ref Range    C-Reactive protein 16.60 (H) 0.00 - 0.90 mg/dL   COVID-19,INFLUENZA A/B,RSV PANEL    Collection Time: 09/21/20  8:29 AM   Result Value  Ref Range    Specimen source NP SWAB     SARS-CoV-2 Positive (AA) Negative    Influenza A by PCR Negative Negative    Influenza B by PCR Negative Negative    RSV by PCR Negative Negative     XR CHEST PA LAT    Result Date: 09/21/2020  1. Hyperinflated lungs.  No acute process identified.     MRI LUMB SPINE W WO CONT    Result Date: 09/21/2020  Redemonstration of edema and enhancement at the left sacroiliac joint space, findings compatible with infectious sacroiliitis.  Findings at the [facility] space are not clearly different from the previous examination.  Previously described rim enhancing fluid collection along the anterior margin of the joint space, essentially within the posterior iliacus muscle, does appear improved.  On today's study, there is band of enhancement here, with perhaps a tiny focus of residual microabscess. Degenerative changes in the lumbar spine as above, most notable at L5-S1.  Findings appear very similar to the previous study. No findings in the lumbar spine to suggest discitis/osteomyelitis.  No findings of an epidural abscess. Other findings as above.        Assessment:     Active Problems:    [place] infectious etiology with history of group C betahemolytic streptococcus, sensitive to ceftriaxone and Levaquin      IVDU (intravenous drug user) (09/21/2020)      Acute COVID-19 (09/21/2020)        Plan:   Group C beta hemolytic strep is suspected  Follow blood cultures  Ceftriaxone IV  Pain Control with Dilaudid  VTE prevention with Lovenox  PT to improve ambulation and advance activity  Social Service eval  Follow for progressive COVID signs and symptoms, no treatment needed at present  Droplet Plus precautions      Admit to inpatient as length of stay is expected to be at least 2 midnights        Signed By: RoYves DillMD     September 21, 2020

## 2020-09-21 NOTE — ED Provider Notes (Signed)
ED Provider Notes by Dayna Ramus, MD at 09/21/20 [phone]                Author: Dayna Ramus, MD  Service: Emergency Medicine  Author Type: Physician       Filed: 09/21/20 1622  Date of Service: 09/21/20 0748  Status: Signed          Editor: Dayna Ramus, MD (Physician)               The patient is the historian and is considered reliable.      Chief complaint:  Back and buttock pain.      HPI:  This is a 34 year old male with a history of intravenous methamphetamine and heroin use.  He was admitted approximately 1 month ago for presumed sepsis of unknown origin and was ultimately diagnosed with a sacroiliitis and small intrapelvic abscess.   He left AMA before completing his antibiotic treatment and has had ongoing pain in his lower back and left buttock that has been getting progressively worse to the point where it is debilitating now.  He denies methamphetamine use since then but continues  to inject heroin which he reports helps control his pain.  He denies fever chills, distal neurologic symptoms, or changes to bowel or bladder habits.  He has had a decreased appetite.  He denies rash or trauma.  He has had some recent congestion and a  mild cough.  He considers his symptoms to be severe.  The pain is an aching pain that does not radiate.  It is always present.                  Past Medical History:        Diagnosis  Date         ?  Blind hypertensive left eye       ?  IV drug abuse ([place])           ?  Polysubstance abuse ([place])             History reviewed. No pertinent surgical history.          Family History:       Family history unknown: Yes             Social History          Socioeconomic History         ?  Marital status:  SINGLE              Spouse name:  Not on file         ?  Number of children:  Not on file     ?  Years of education:  Not on file     ?  Highest education level:  Not on file       Occupational History        ?  Not on file       Tobacco Use         ?  Smoking  status:  Current Every Day Smoker              Packs/day:  1.00         ?  Smokeless tobacco:  Not on file       Substance and Sexual Activity         ?  Alcohol use:  Not Currently     ?  Drug use:  Yes  Types:  Heroin, Methamphetamines, IV         ?  Sexual activity:  Not on file        Other Topics  Concern        ?  Not on file       Social History Narrative        ?  Not on file          Social Determinants of Health          Financial Resource Strain:         ?  Difficulty of Paying Living Expenses: Not on file       Food Insecurity:         ?  Worried About Running Out of Food in the Last Year: Not on file     ?  Ran Out of Food in the Last Year: Not on file       Transportation Needs:         ?  Lack of Transportation (Medical): Not on file     ?  Lack of Transportation (Non-Medical): Not on file       Physical Activity:         ?  Days of Exercise per Week: Not on file     ?  Minutes of Exercise per Session: Not on file       Stress:         ?  Feeling of Stress : Not on file       Social Connections:         ?  Frequency of Communication with Friends and Family: Not on file     ?  Frequency of Social Gatherings with Friends and Family: Not on file     ?  Attends Religious Services: Not on file     ?  Active Member of Clubs or Organizations: Not on file     ?  Attends Archivist Meetings: Not on file     ?  Marital Status: Not on file       Intimate Partner Violence:         ?  Fear of Current or Ex-Partner: Not on file     ?  Emotionally Abused: Not on file     ?  Physically Abused: Not on file     ?  Sexually Abused: Not on file       Housing Stability:         ?  Unable to Pay for Housing in the Last Year: Not on file     ?  Number of Places Lived in the Last Year: Not on file        ?  Unstable Housing in the Last Year: Not on file              ALLERGIES: Patient has no known allergies.      Review of Systems    Constitutional: Negative for fever.    As per above and HPI, 10  systems reviewed and otherwise normal or negative.        Vitals:          09/21/20 0715        BP:  119/73     Pulse:  (!) 106     Resp:  18     Temp:  98.3 ??F (36.8 ??C)     SpO2:  96%     Weight:  68 kg (  150 lb)        Height:  5' 9"  (1.753 m)                Physical Exam   Vitals and nursing note reviewed.   Constitutional:        General: He is in acute distress (Uncomfortable appearing) .      Appearance: He is well-developed. He is not ill-appearing, toxic-appearing or diaphoretic.    HENT:       Head: Normocephalic and atraumatic.   Eyes :       Comments: Left pupil fixed and dilated (chronic)   Cardiovascular :       Rate and Rhythm: Normal rate and regular rhythm.      Heart sounds: Normal heart sounds.    Pulmonary:       Effort: Pulmonary effort is normal. No respiratory distress.      Breath sounds: Wheezing  ( diffuse, mild) present.   Abdominal:      General: There is no distension.       Palpations: There is no mass.      Tenderness: There is no abdominal tenderness.     Musculoskeletal:          General: Normal range of motion.      Cervical back: Normal range of motion. No rigidity or tenderness.        Back:       Skin:      General: Skin is warm and dry.      Findings: No rash.    Neurological:       Mental Status: He is alert and oriented to person, place, and time.      Comments: Normal L4, L5, and S1 motor and sensory function bilaterally.    Psychiatric :         Mood and Affect: Mood normal.              MDM   This patient presents to the emergency department with findings consistent with incompletely treated sacroiliitis.  This could also simply be residual back pain.  He is also wheezing and has some mild congestion which could be a viral URI, COVID-19, or  pneumonia..  I have ordered CBC, CMP, blood cultures, ESR, CRP, chest x-ray, viral respiratory swab, and an MRI of the lumbar spine.  He will be given IV fluids and morphine for pain.     ED Course as of 09/21/20 1100       Fri Sep 21, 2020        1057  Patient's MRI is consistent with incompletely treated infectious sacroiliitis.  The previously noted abscess is improved.  He is also COVID-19 positive.   We will start Rocephin and he will be admitted to the hospitalist service. [JB]              ED Course User Index   [JB] Rector Devonshire, Keitha Butte, MD           Procedures      Vital Signs for this visit:   Patient Vitals for the past 12 hrs:            Temp  Pulse  Resp  BP  SpO2            09/21/20 0715  98.3 ??F (36.8 ??C)  (!) 106  18  119/73  96 %           Lab findings during this visit (  only abnormal values will be noted, if no value noted then the result was normal range):     Labs Reviewed       COVID-19,INFLUENZA A/B,RSV PANEL - Abnormal; Notable for the following components:            Result  Value            SARS-CoV-2  Positive (*)            All other components within normal limits       CBC WITH AUTOMATED DIFF - Abnormal; Notable for the following components:            WBC  12.0 (*)         RBC  4.14 (*)         HGB  12.1 (*)         HCT  35.9 (*)         PLATELET  412 (*)         ABS. NEUTROPHILS  8.5 (*)         ABS. IMM. GRANS.  0.2 (*)            All other components within normal limits       METABOLIC PANEL, COMPREHENSIVE - Abnormal; Notable for the following components:            Chloride  97 (*)         Calcium  8.6 (*)         Protein, total  8.2 (*)         Albumin  3.1 (*)            All other components within normal limits       SED RATE, AUTOMATED - Abnormal; Notable for the following components:            Sed rate, automated  101 (*)            All other components within normal limits       C REACTIVE PROTEIN, QT - Abnormal; Notable for the following components:            C-Reactive protein  16.60 (*)            All other components within normal limits       CULTURE, BLOOD       CULTURE, BLOOD,  2ND DRAW           Radiology studies during this visit   XR CHEST PA LAT      Result Date: 09/21/2020   1. Hyperinflated  lungs.  No acute process identified.       MRI LUMB SPINE W WO CONT      Result Date: 09/21/2020   Redemonstration of edema and enhancement at the left sacroiliac joint space, findings compatible with infectious sacroiliitis.  Findings at the [facility] joint space are not clearly different from the previous examination.  Previously described rim enhancing  fluid collection along the anterior margin of the joint space, essentially within the posterior iliacus muscle, does appear improved.  On today's study, there is band of enhancement here, with perhaps a tiny focus of residual microabscess. Degenerative  changes in the lumbar spine as above, most notable at L5-S1.  Findings appear very similar to the previous study. No findings in the lumbar spine to suggest discitis/osteomyelitis.  No findings of an epidural abscess. Other findings as above.          Medications  given in the ED:     Medications       morphine injection 4 mg (4 mg IntraVENous Given 09/21/20 0826)     cefTRIAXone (ROCEPHIN) 1 g in 0.9% sodium chloride (MBP/ADV) 50 mL MBP (has no administration in time range)     0.9% sodium chloride infusion (150 mL/hr IntraVENous New Bag 09/21/20 0826)       gadobutroL (GADAVIST) contrast solution 6 mL (6 mL IntraVENous Given 09/21/20 0850)           Diagnosis:            ICD-10-CM  ICD-9-CM          1.  Sacroiliitis (HCC)   M46.1  720.2          2.  COVID-19   U07.1  079.89           Condition at disposition:   Condition stable      Disposition:   Admit      Discharge prescriptions and/or changes if applicable:  There are no discharge medications  for this patient.         Follow-up:   No follow-up provider specified.      Please note that portions of this document were created using the M*Modal Fluency Direct dictation system.  Any inconsistencies or typographical errors may be the result of mis-transcription that persist in spite of proof-reading and should be addressed  with the document creator.

## 2020-09-21 NOTE — Assessment & Plan Note (Signed)
Group C beta hemolytic strep is suspected  Blood cultures with no growth to date  Ceftriaxone IV discontinued due to no IV access due to IVDU  Levaquin 500mg  started  Okay for patient not have an IV unless he becomes unstable  Pain Control with oxycodone, stable without complaints  VTE prevention with Lovenox  PT to improve ambulation and advance activity  Social Service following  Back pain is improving, he is now walking  WBC improving, C-reactive protein improving  Possible discharge on 12/16

## 2020-09-21 NOTE — ED Notes (Signed)
Patient provided with turkey sandwich and ginger ale.

## 2020-09-21 NOTE — Assessment & Plan Note (Signed)
Mild URI symptoms presently, symptoms not progressing. Likely began 4 days prior to admission.  Oxygenation stable  Continues with no need for COVID directed medications at this time

## 2020-09-22 LAB — COMPREHENSIVE METABOLIC PANEL
ALT: 23 U/L (ref 3–35)
AST: 16 U/L (ref 15–40)
Albumin/Globulin Ratio: 0.6
Albumin: 2.9 g/dL — ABNORMAL LOW (ref 3.5–5.0)
Alkaline Phosphatase: 52 U/L (ref 35–100)
Anion Gap: 15 mmol/L
BUN: 12 MG/DL (ref 7–20)
Bun/Cre Ratio: 18 NA
CO2: 25 mmol/L (ref 20–32)
Calcium: 8.4 MG/DL — ABNORMAL LOW (ref 8.8–10.5)
Chloride: 99 mmol/L — ABNORMAL LOW (ref 100–110)
Creatinine: 0.66 MG/DL (ref 0.40–1.20)
EGFR IF NonAfrican American: >60 mL/min/{1.73_m2} (ref >60)
GFR African American: >60 mL/min/{1.73_m2} (ref >60)
Globulin: 4.9 g/dL
Glucose: 89 mg/dL (ref 75–110)
Potassium: 4.1 mmol/L (ref 3.5–5.0)
Sodium: 135 mmol/L (ref 135–145)
Total Bilirubin: 0.5 mg/dL (ref 0.10–1.20)
Total Protein: 7.8 g/dL (ref 6.2–8.0)

## 2020-09-22 LAB — CBC WITH AUTO DIFFERENTIAL
Basophils %: 0 %
Basophils Absolute: 0.1 10*3/uL (ref 0.0–0.2)
Eosinophils %: 1 %
Eosinophils Absolute: 0.1 10*3/uL (ref 0.0–0.5)
Granulocyte Absolute Count: 0.1 10*3/uL (ref 0.0–0.1)
Hematocrit: 34.6 % — ABNORMAL LOW (ref 42.0–52.0)
Hemoglobin: 11.6 g/dL — ABNORMAL LOW (ref 14.0–18.0)
Immature Granulocytes: 1 %
Lymphocytes %: 26 %
Lymphocytes Absolute: 2.9 10*3/uL (ref 1.0–4.5)
MCH: 29.1 PG (ref 28.0–34.0)
MCHC: 33.5 g/dL (ref 32.0–36.0)
MCV: 86.9 FL (ref 80.0–100.0)
MPV: 9.3 FL (ref 7.0–12.0)
Monocytes %: 8 %
Monocytes Absolute: 0.9 10*3/uL — ABNORMAL HIGH (ref 0.1–0.8)
NRBC Absolute: 0 10*3/uL
Neutrophils %: 64 %
Neutrophils Absolute: 7.1 10*3/uL (ref 1.9–7.8)
Nucleated RBCs: 0 PER 100 WBC
Platelets: 450 10*3/uL — ABNORMAL HIGH (ref 150–400)
RBC: 3.98 M/uL — ABNORMAL LOW (ref 4.50–6.00)
RDW: 13.2 % (ref 11.5–13.5)
WBC: 11.2 10*3/uL — ABNORMAL HIGH (ref 4.8–10.8)

## 2020-09-22 LAB — C-REACTIVE PROTEIN: CRP: 11.1 mg/dL — ABNORMAL HIGH (ref 0.00–0.90)

## 2020-09-22 LAB — METABOLIC PANEL, COMPREHENSIVE
A-G Ratio: 0.6
ALT (SGPT): 23 U/L (ref 3–35)
AST (SGOT): 16 U/L (ref 15–40)
Albumin: 2.9 g/dL — ABNORMAL LOW (ref 3.5–5.0)
Alk. phosphatase: 52 U/L (ref 35–100)
Anion gap: 15 mmol/L
BUN/Creatinine ratio: 18
BUN: 12 MG/DL (ref 7–20)
Bilirubin, total: 0.5 mg/dL (ref 0.10–1.20)
CO2: 25 mmol/L (ref 20–32)
Calcium: 8.4 MG/DL — ABNORMAL LOW (ref 8.8–10.5)
Chloride: 99 mmol/L — ABNORMAL LOW (ref 100–110)
Creatinine: 0.66 MG/DL (ref 0.40–1.20)
GFR est AA: >60 mL/min/{1.73_m2} (ref >60)
GFR est non-AA: >60 mL/min/{1.73_m2} (ref >60)
Globulin: 4.9 g/dL
Glucose: 89 mg/dL (ref 75–110)
Potassium: 4.1 mmol/L (ref 3.5–5.0)
Protein, total: 7.8 g/dL (ref 6.2–8.0)
Sodium: 135 mmol/L (ref 135–145)

## 2020-09-22 LAB — CBC WITH AUTOMATED DIFF
ABS. BASOPHILS: 0.1 10*3/uL (ref 0.0–0.2)
ABS. EOSINOPHILS: 0.1 10*3/uL (ref 0.0–0.5)
ABS. IMM. GRANS.: 0.1 10*3/uL (ref 0.0–0.1)
ABS. LYMPHOCYTES: 2.9 10*3/uL (ref 1.0–4.5)
ABS. MONOCYTES: 0.9 10*3/uL — ABNORMAL HIGH (ref 0.1–0.8)
ABS. NEUTROPHILS: 7.1 10*3/uL (ref 1.9–7.8)
ABSOLUTE NRBC: 0 10*3/uL
BASOPHILS: 0 %
EOSINOPHILS: 1 %
HCT: 34.6 % — ABNORMAL LOW (ref 42.0–52.0)
HGB: 11.6 g/dL — ABNORMAL LOW (ref 14.0–18.0)
IMMATURE GRANULOCYTES: 1 %
LYMPHOCYTES: 26 %
MCH: 29.1 PG (ref 28.0–34.0)
MCHC: 33.5 g/dL (ref 32.0–36.0)
MCV: 86.9 FL (ref 80.0–100.0)
MONOCYTES: 8 %
MPV: 9.3 FL (ref 7.0–12.0)
NEUTROPHILS: 64 %
NRBC: 0 PER 100 WBC
PLATELET: 450 10*3/uL — ABNORMAL HIGH (ref 150–400)
RBC: 3.98 M/uL — ABNORMAL LOW (ref 4.50–6.00)
RDW: 13.2 % (ref 11.5–13.5)
WBC: 11.2 10*3/uL — ABNORMAL HIGH (ref 4.8–10.8)

## 2020-09-22 LAB — C REACTIVE PROTEIN, QT: C-Reactive protein: 11.1 mg/dL — ABNORMAL HIGH (ref 0.00–0.90)

## 2020-09-22 MED FILL — MORPHINE 2 MG/ML INJECTION: 2 mg/mL | INTRAMUSCULAR | Qty: 2

## 2020-09-22 MED FILL — HYDROMORPHONE 2 MG TAB: 2 mg | ORAL | Qty: 1

## 2020-09-22 MED FILL — CEFTRIAXONE 2 GRAM SOLUTION FOR INJECTION: 2 gram | INTRAMUSCULAR | Qty: 2

## 2020-09-22 MED FILL — DIPHENHYDRAMINE HCL 50 MG/ML IJ SOLN: 50 mg/mL | INTRAMUSCULAR | Qty: 1

## 2020-09-22 NOTE — Progress Notes (Signed)
Problem: Falls - Risk of  Goal: *Absence of Falls  Description: Document Paul George Fall Risk and appropriate interventions in the flowsheet.  Outcome: Progressing Towards Goal  Note: Fall Risk Interventions:  Mobility Interventions: Bed/chair exit alarm, Communicate number of staff needed for ambulation/transfer, Patient to call before getting OOB, Utilize walker, cane, or other assistive device         Medication Interventions: Bed/chair exit alarm, Patient to call before getting OOB, Teach patient to arise slowly    Elimination Interventions: Bed/chair exit alarm, Call light in reach, Patient to call for help with toileting needs, Urinal in reach              Problem: Patient Education: Go to Patient Education Activity  Goal: Patient/Family Education  Outcome: Progressing Towards Goal     Problem: Airway Clearance - Ineffective  Goal: Achieve or maintain patent airway  Outcome: Progressing Towards Goal     Problem: Gas Exchange - Impaired  Goal: Absence of hypoxia  Outcome: Progressing Towards Goal  Goal: Promote optimal lung function  Outcome: Progressing Towards Goal     Problem: Breathing Pattern - Ineffective  Goal: Ability to achieve and maintain a regular respiratory rate  Outcome: Progressing Towards Goal     Problem: Body Temperature -  Risk of, Imbalanced  Goal: Ability to maintain a body temperature within defined limits  Outcome: Progressing Towards Goal  Goal: Will regain or maintain usual level of consciousness  Outcome: Progressing Towards Goal  Goal: Complications related to the disease process, condition or treatment will be avoided or minimized  Outcome: Progressing Towards Goal     Problem: Isolation Precautions - Risk of Spread of Infection  Goal: Prevent transmission of infectious organism to others  Outcome: Progressing Towards Goal     Problem: Nutrition Deficits  Goal: Optimize nutrtional status  Outcome: Progressing Towards Goal     Problem: Risk for Fluid Volume Deficit  Goal: Maintain  normal heart rhythm  Outcome: Progressing Towards Goal  Goal: Maintain absence of muscle cramping  Outcome: Progressing Towards Goal  Goal: Maintain normal serum potassium, sodium, calcium, phosphorus, and pH  Outcome: Progressing Towards Goal     Problem: Loneliness or Risk for Loneliness  Goal: Demonstrate positive use of time alone when socialization is not possible  Outcome: Progressing Towards Goal     Problem: Fatigue  Goal: Verbalize increase energy and improved vitality  Outcome: Progressing Towards Goal     Problem: Patient Education: Go to Patient Education Activity  Goal: Patient/Family Education  Outcome: Progressing Towards Goal

## 2020-09-22 NOTE — Progress Notes (Signed)
Hospitalist Progress Note    Subjective:   Daily Progress Note: 09/22/2020 10:20 PM    Chief Complaint:     My left back continues with constant pain    Current Facility-Administered Medications   Medication Dose Route Frequency   ??? sodium chloride (NS) flush 3-10 mL  3-10 mL IntraVENous Q12H   ??? sodium chloride (NS) flush 3-10 mL  3-10 mL IntraVENous PRN   ??? acetaminophen (TYLENOL) tablet 650 mg  650 mg Oral Q4H PRN    Or   ??? acetaminophen (TYLENOL) solution 650 mg  650 mg Oral Q4H PRN    Or   ??? acetaminophen (TYLENOL) suppository 650 mg  650 mg Rectal Q4H PRN   ??? naloxone (NARCAN) injection 0.4 mg  0.4 mg IntraVENous PRN   ??? dicyclomine (BENTYL) capsule 10 mg  10 mg Oral Q6H PRN   ??? diphenhydrAMINE (BENADRYL) injection 12.5 mg  12.5 mg IntraVENous Q4H PRN   ??? ondansetron (ZOFRAN) injection 4 mg  4 mg IntraVENous Q6H PRN    Or   ??? ondansetron (ZOFRAN ODT) tablet 4 mg  4 mg Oral Q6H PRN   ??? magnesium hydroxide (MILK OF MAGNESIA) 400 mg/5 mL oral suspension 30 mL  30 mL Oral DAILY PRN   ??? bisacodyL (DULCOLAX) tablet 10 mg  10 mg Oral DAILY PRN   ??? enoxaparin (LOVENOX) injection 40 mg  40 mg SubCUTAneous Q24H   ??? HYDROmorphone (DILAUDID) tablet 2 mg  2 mg Oral Q4H PRN   ??? cefTRIAXone (ROCEPHIN) 2 g in 0.9% sodium chloride (MBP/ADV) 50 mL MBP  2 g IntraVENous Q24H        Review of Systems  Review of Systems   Constitutional: Negative.    HENT: Negative.    Eyes: Negative.    Respiratory: Negative.    Cardiovascular: Negative.    Gastrointestinal: Negative.    Genitourinary: Negative.    Musculoskeletal: Positive for joint pain.   Skin: Negative.    Neurological: Negative.    Endo/Heme/Allergies: Negative.    Psychiatric/Behavioral: Positive for substance abuse.       Objective:     Visit Vitals  BP 118/75 (BP 1 Location: Left upper arm, BP Patient Position: At rest)   Pulse 84   Temp 98.1 ??F (36.7 ??C)   Resp 20   Ht 5' 9"  (1.753 m)   Wt 68.2 kg (150 lb 5.7 oz)   SpO2 91%   BMI 22.20 kg/m??      O2 Device: None (Room  air)    Temp (24hrs), Avg:98.3 ??F (36.8 ??C), Min:97.8 ??F (36.6 ??C), Max:98.6 ??F (37 ??C)      12/11 1901 - 12/12 0700  In: 360 [P.O.:360]  Out: 200 [Urine:200]  12/10 0701 - 12/11 1900  In: 2760 [P.O.:1660; I.V.:1100]  Out: 1510 [Urine:1510]    Physical Exam:  Constitutional:  no acute distress, not toxic  HEENT: atraumatic, no icterus, supple without adenopathy  Respiratory: clear  Cardiovascular: RRR, nl S1S2  Abdominal Exam: soft   Extremities: no edema, no clubbing, no cyanosis  Neurologic: no focal changes, moves all extremities  Psychiatric: mood is appropriate  Musculoskeletal: no acute synovitis      Data Review     Recent Results (from the past 24 hour(s))   METABOLIC PANEL, COMPREHENSIVE    Collection Time: 09/22/20  7:06 AM   Result Value Ref Range    Sodium 135 135 - 145 mmol/L    Potassium 4.1 3.5 - 5.0 mmol/L  Chloride 99 (L) 100 - 110 mmol/L    CO2 25 20 - 32 mmol/L    Anion gap 15 mmol/L    Glucose 89 75 - 110 mg/dL    BUN 12 7 - 20 MG/DL    Creatinine 0.66 0.40 - 1.20 MG/DL    BUN/Creatinine ratio 18     GFR est AA >60 >60 ml/min/1.2m    GFR est non-AA >60 >60 ml/min/1.737m   Calcium 8.4 (L) 8.8 - 10.5 MG/DL    Bilirubin, total 0.50 0.10 - 1.20 mg/dL    ALT (SGPT) 23 3 - 35 U/L    AST (SGOT) 16 15 - 40 U/L    Alk. phosphatase 52 35 - 100 U/L    Protein, total 7.8 6.2 - 8.0 g/dL    Albumin 2.9 (L) 3.5 - 5.0 g/dL    Globulin 4.9 g/dL    A-G Ratio 0.6     CBC WITH AUTOMATED DIFF    Collection Time: 09/22/20  7:06 AM   Result Value Ref Range    WBC 11.2 (H) 4.8 - 10.8 K/uL    RBC 3.98 (L) 4.50 - 6.00 M/uL    HGB 11.6 (L) 14.0 - 18.0 g/dL    HCT 34.6 (L) 42.0 - 52.0 %    MCV 86.9 80.0 - 100.0 FL    MCH 29.1 28.0 - 34.0 PG    MCHC 33.5 32.0 - 36.0 g/dL    RDW 13.2 11.5 - 13.5 %    PLATELET 450 (H) 150 - 400 K/uL    MPV 9.3 7.0 - 12.0 FL    NEUTROPHILS 64 %    LYMPHOCYTES 26 %    MONOCYTES 8 %    EOSINOPHILS 1 %    BASOPHILS 0 %    IMMATURE GRANULOCYTES 1 %    ABS. NEUTROPHILS 7.1 1.9 - 7.8 K/UL     ABS. LYMPHOCYTES 2.9 1.0 - 4.5 K/UL    ABS. MONOCYTES 0.9 (H) 0.1 - 0.8 K/UL    ABS. EOSINOPHILS 0.1 0.0 - 0.5 K/UL    ABS. BASOPHILS 0.1 0.0 - 0.2 K/UL    ABS. IMM. GRANS. 0.1 0.0 - 0.1 K/UL    NRBC 0.0 PER 100 WBC    ABSOLUTE NRBC 0.00 K/uL    DF AUTOMATED    C REACTIVE PROTEIN, QT    Collection Time: 09/22/20  7:06 AM   Result Value Ref Range    C-Reactive protein 11.10 (H) 0.00 - 0.90 mg/dL       Reviewed notes from previous day   Reviewed new clinical lab tests from today    Reviewed imaging independently ??? CXR imaging     Assessment/Plan:     Acute COVID-19  Mild URI symptoms presently, follow for progression of symptoms. Likely began 4 days prior to admission.  Oxygenation stable  No need for COVID directed medications at this time    IVDU (intravenous drug user)  Social Service Eval    [facility] is [facility]  Follow blood cultures  Ceftriaxone IV  Pain Control with Dilaudid, stable without complaints  VTE prevention with Lovenox  PT to improve ambulation and advance activity  Social Service eval  Blood cultures no growth to date      Care Plan discussed with: Patient and with care team including nursing.      I spent 30 minutes of which greater than 50% was spent face-to-face counseling and/or coordinating care to discuss  the diagnosis, treatment and management options for patient today.    Signed By: Yves Dill, MD     September 22, 2020 10:20 PM

## 2020-09-23 LAB — CBC WITH AUTO DIFFERENTIAL
Basophils %: 0 %
Basophils Absolute: 0.1 10*3/uL (ref 0.0–0.2)
Eosinophils %: 0 %
Eosinophils Absolute: 0 10*3/uL (ref 0.0–0.5)
Granulocyte Absolute Count: 0.2 10*3/uL — ABNORMAL HIGH (ref 0.0–0.1)
Hematocrit: 37.6 % — ABNORMAL LOW (ref 42.0–52.0)
Hemoglobin: 12.6 g/dL — ABNORMAL LOW (ref 14.0–18.0)
Immature Granulocytes: 1 %
Lymphocytes %: 21 %
Lymphocytes Absolute: 3.1 10*3/uL (ref 1.0–4.5)
MCH: 28.9 PG (ref 28.0–34.0)
MCHC: 33.5 g/dL (ref 32.0–36.0)
MCV: 86.2 FL (ref 80.0–100.0)
MPV: 9.1 FL (ref 7.0–12.0)
Monocytes %: 9 %
Monocytes Absolute: 1.3 10*3/uL — ABNORMAL HIGH (ref 0.1–0.8)
NRBC Absolute: 0 10*3/uL
Neutrophils %: 69 %
Neutrophils Absolute: 9.8 10*3/uL — ABNORMAL HIGH (ref 1.9–7.8)
Nucleated RBCs: 0 PER 100 WBC
Platelets: 496 10*3/uL — ABNORMAL HIGH (ref 150–400)
RBC: 4.36 M/uL — ABNORMAL LOW (ref 4.50–6.00)
RDW: 13.3 % (ref 11.5–13.5)
WBC: 14.3 10*3/uL — ABNORMAL HIGH (ref 4.8–10.8)

## 2020-09-23 LAB — COMPREHENSIVE METABOLIC PANEL
ALT: 21 U/L (ref 3–35)
AST: 19 U/L (ref 15–40)
Albumin/Globulin Ratio: 0.6
Albumin: 3.1 g/dL — ABNORMAL LOW (ref 3.5–5.0)
Alkaline Phosphatase: 54 U/L (ref 35–100)
Anion Gap: 18 mmol/L
BUN: 11 MG/DL (ref 7–20)
Bun/Cre Ratio: 15 NA
CO2: 21 mmol/L (ref 20–32)
Calcium: 8.6 MG/DL — ABNORMAL LOW (ref 8.8–10.5)
Chloride: 97 mmol/L — ABNORMAL LOW (ref 100–110)
Creatinine: 0.73 MG/DL (ref 0.40–1.20)
EGFR IF NonAfrican American: >60 mL/min/{1.73_m2} (ref >60)
GFR African American: >60 mL/min/{1.73_m2} (ref >60)
Globulin: 5.1 g/dL
Glucose: 122 mg/dL — ABNORMAL HIGH (ref 75–110)
Potassium: 3.8 mmol/L (ref 3.5–5.0)
Sodium: 132 mmol/L — ABNORMAL LOW (ref 135–145)
Total Bilirubin: 0.7 mg/dL (ref 0.10–1.20)
Total Protein: 8.2 g/dL — ABNORMAL HIGH (ref 6.2–8.0)

## 2020-09-23 LAB — METABOLIC PANEL, COMPREHENSIVE
A-G Ratio: 0.6
ALT (SGPT): 21 U/L (ref 3–35)
AST (SGOT): 19 U/L (ref 15–40)
Albumin: 3.1 g/dL — ABNORMAL LOW (ref 3.5–5.0)
Alk. phosphatase: 54 U/L (ref 35–100)
Anion gap: 18 mmol/L
BUN/Creatinine ratio: 15
BUN: 11 MG/DL (ref 7–20)
Bilirubin, total: 0.7 mg/dL (ref 0.10–1.20)
CO2: 21 mmol/L (ref 20–32)
Calcium: 8.6 MG/DL — ABNORMAL LOW (ref 8.8–10.5)
Chloride: 97 mmol/L — ABNORMAL LOW (ref 100–110)
Creatinine: 0.73 MG/DL (ref 0.40–1.20)
GFR est AA: >60 mL/min/{1.73_m2} (ref >60)
GFR est non-AA: >60 mL/min/{1.73_m2} (ref >60)
Globulin: 5.1 g/dL
Glucose: 122 mg/dL — ABNORMAL HIGH (ref 75–110)
Potassium: 3.8 mmol/L (ref 3.5–5.0)
Protein, total: 8.2 g/dL — ABNORMAL HIGH (ref 6.2–8.0)
Sodium: 132 mmol/L — ABNORMAL LOW (ref 135–145)

## 2020-09-23 LAB — CBC WITH AUTOMATED DIFF
ABS. BASOPHILS: 0.1 10*3/uL (ref 0.0–0.2)
ABS. EOSINOPHILS: 0 10*3/uL (ref 0.0–0.5)
ABS. IMM. GRANS.: 0.2 10*3/uL — ABNORMAL HIGH (ref 0.0–0.1)
ABS. LYMPHOCYTES: 3.1 10*3/uL (ref 1.0–4.5)
ABS. MONOCYTES: 1.3 10*3/uL — ABNORMAL HIGH (ref 0.1–0.8)
ABS. NEUTROPHILS: 9.8 10*3/uL — ABNORMAL HIGH (ref 1.9–7.8)
ABSOLUTE NRBC: 0 10*3/uL
BASOPHILS: 0 %
EOSINOPHILS: 0 %
HCT: 37.6 % — ABNORMAL LOW (ref 42.0–52.0)
HGB: 12.6 g/dL — ABNORMAL LOW (ref 14.0–18.0)
IMMATURE GRANULOCYTES: 1 %
LYMPHOCYTES: 21 %
MCH: 28.9 PG (ref 28.0–34.0)
MCHC: 33.5 g/dL (ref 32.0–36.0)
MCV: 86.2 FL (ref 80.0–100.0)
MONOCYTES: 9 %
MPV: 9.1 FL (ref 7.0–12.0)
NEUTROPHILS: 69 %
NRBC: 0 PER 100 WBC
PLATELET: 496 10*3/uL — ABNORMAL HIGH (ref 150–400)
RBC: 4.36 M/uL — ABNORMAL LOW (ref 4.50–6.00)
RDW: 13.3 % (ref 11.5–13.5)
WBC: 14.3 10*3/uL — ABNORMAL HIGH (ref 4.8–10.8)

## 2020-09-23 MED FILL — ACETAMINOPHEN 325 MG TABLET: 325 mg | ORAL | Qty: 2

## 2020-09-23 MED FILL — HYDROMORPHONE 2 MG TAB: 2 mg | ORAL | Qty: 1

## 2020-09-23 MED FILL — CEFTRIAXONE 2 GRAM SOLUTION FOR INJECTION: 2 gram | INTRAMUSCULAR | Qty: 2

## 2020-09-23 MED FILL — ENOXAPARIN 40 MG/0.4 ML SUB-Q SYRINGE: 40 mg/0.4 mL | SUBCUTANEOUS | Qty: 0.4

## 2020-09-23 NOTE — Progress Notes (Signed)
GENERAL THERAPY EVALUATION  Background  Precautions at Admission    Home Situation  Home Environment: Trailer/mobile home  # Steps to Enter: 0  One/Two Story Residence: One story  Living Alone: No (With friends)  Support Systems: No Support Systems  Patient Expects to be Discharged to:: Trailer/mobile home  Current DME Used/Available at Home: Cane, straight  Tub or Shower Type: Tub/Shower combination  Psychosocial  Patient Behaviors: Calm; Cooperative              Mental Status  Neurologic State: Alert  Orientation Level: Oriented X4           Safety/Judgement: Awareness of environment  Vision                       Auditory     Balance  Balance  Sitting: Intact  Standing: Impaired; Without support  Standing - Static: Good  Standing - Dynamic : Fair  Bed Mobility  Rolling: Independent  Supine to Sit: Modified independent  Sit to Supine: Modified independent  Scooting: Modified independent Functional Transfers  Sit to Stand: Supervision  Stand to Sit: Supervision  Bed to Chair: Supervision                    Assistive Device: Walker, IT sales professional of Support: Narrowed  Speed/Cadence: Slow  Step Length: Left shortened; Right shortened  Gait Abnormalities: Antalgic; Decreased step clearance  Ambulation - Level of Assistance: Contact guard assistance  Distance (ft): 25 Feet (ft)  Assistive Device: Walker, rolling  Gait Description (WDL): Exceptions to WDL  Assistive Device: Walker, rolling  Weight Bearing Status        Gait  Base of Support: Narrowed  Speed/Cadence: Slow  Step Length: Left shortened; Right shortened        Gait Abnormalities: Antalgic; Decreased step clearance  Ambulation - Level of Assistance: Contact guard assistance  Distance (ft): 25 Feet (ft)                       Wheelchair Mobility       Basic ADL                    IADLs              Upper extremity assessment:     Lower extremity assessment:      Therapy Evaluation  Subjective: "I am moving to the [place] when I get out of  here."  Prior Level of Function: Patient reports being completely indep prior to coming to the hospital though has been using a cane intermittently secondary to LE pain.  Indep with ambulation/transfers, ADL, driving etc.  Patient has no help at home and reports he is living in a trailer with some friends at this time and is not employed.   Diagnosis:  Active Problems:    Sacroiliitis (HCC) (09/21/2020)      IVDU (intravenous drug user) (09/21/2020)      Acute COVID-19 (09/21/2020)         Past medical history:  Past Medical History:   Diagnosis Date   . Blind hypertensive left eye    . IV drug abuse (HCC)    . Polysubstance abuse (HCC)      Assessment: Based on the objective data in flowsheets, the patient presents with decreased B LE strength, pain with all ROM to LE's and gross motor activities, decreased  activity tolerance and standing balance.  Patient seen for AROM exercises to B LE's in supine/seated postures up to 10 reps: hip flexion/abduction/adduction/quad and glut sets: knee flexion/extension and ankle pumps to promote strength, activity tolerance, balance decrease risk of DVT.  Patient seen for transfer training with SBA to CGA and verbal cues for proper hand/foot placement and safety.  Gait training up to 36ft with CGA and 2WW with verbal cues for proper use of 2WW, improved stride length and heel strike.  Patient will benefit from continued PT to promote improved indep with ambulation and transfers to return home and to PLOF.  May benefit from HH-A upon discharge or outpatient PT services.  [employer]: [employer] AM-PAC '6 Clicks'  Basic Mobility (V.2) Inpatient Short Form  Please check the box that reflects your best answer to each question. How much help from another person do you currently need.  (If the patient hasn't done an activity recently, how much help from another person do you think he/she would need if he/she tried?) Total A Lot A Little None   1. Turning from your back to your side  while in a flat bed without using bedrails? 1 2 3 4    2. Moving from lying on your back to sitting on the side of a flat bed without using bedrails? 1 2 3 4    3. Moving to and from a bed to a chair (including a wheelchair)? 1 2 3 4    4. Standing up from a chair using your arms (e.g., wheelchair, or bedside chair)? 1 2 3 4    5. To walk in hospital room? 1 2 3 4    6. Climbing 3-5 steps with a railing?* 1 2 3 4    AM-PAC Short Form Manual (v. 3.0)  2016, Trustees of [address], under license to [employer]. All rights reserved.  *If stair climbing cannot be assessed, skip item #6. Summarize responses for items 1-5 and use the 5-item conversion table to obtain the Standardized (t-scale) score.    Raw Score: 17 without stairs    Patient Name: Paul George  Evaluating Therapist: Katherene Ponto  Date: 09/23/2020      Bed alarm activated: NO  Chair alarm applied: N/A  Call bed within reach: YES  Urinal within reach or Purwick/Foley Catheter in place: YES  Transportation Plan: TBD  Communicated with: RN  Handouts: None        PT Therapy Comments: PTA-YES:HH-U  OT    PT Plan of Care: 5 times/week       Therapist: Katherene Ponto

## 2020-09-23 NOTE — Progress Notes (Signed)
Hospitalist Progress Note    Subjective:   Daily Progress Note: 09/23/2020 5:54 PM    Chief Complaint:     My back still hurts. I have not seen PT yet.  Pain localized to left, no radiation.    Current Facility-Administered Medications   Medication Dose Route Frequency   ??? sodium chloride (NS) flush 3-10 mL  3-10 mL IntraVENous Q12H   ??? sodium chloride (NS) flush 3-10 mL  3-10 mL IntraVENous PRN   ??? acetaminophen (TYLENOL) tablet 650 mg  650 mg Oral Q4H PRN    Or   ??? acetaminophen (TYLENOL) solution 650 mg  650 mg Oral Q4H PRN    Or   ??? acetaminophen (TYLENOL) suppository 650 mg  650 mg Rectal Q4H PRN   ??? naloxone (NARCAN) injection 0.4 mg  0.4 mg IntraVENous PRN   ??? dicyclomine (BENTYL) capsule 10 mg  10 mg Oral Q6H PRN   ??? diphenhydrAMINE (BENADRYL) injection 12.5 mg  12.5 mg IntraVENous Q4H PRN   ??? ondansetron (ZOFRAN) injection 4 mg  4 mg IntraVENous Q6H PRN    Or   ??? ondansetron (ZOFRAN ODT) tablet 4 mg  4 mg Oral Q6H PRN   ??? magnesium hydroxide (MILK OF MAGNESIA) 400 mg/5 mL oral suspension 30 mL  30 mL Oral DAILY PRN   ??? bisacodyL (DULCOLAX) tablet 10 mg  10 mg Oral DAILY PRN   ??? enoxaparin (LOVENOX) injection 40 mg  40 mg SubCUTAneous Q24H   ??? HYDROmorphone (DILAUDID) tablet 2 mg  2 mg Oral Q4H PRN   ??? cefTRIAXone (ROCEPHIN) 2 g in 0.9% sodium chloride (MBP/ADV) 50 mL MBP  2 g IntraVENous Q24H        Review of Systems  Review of Systems   Constitutional: Negative.    HENT: Negative.    Eyes: Negative.    Respiratory: Negative.    Cardiovascular: Negative.    Gastrointestinal: Negative.    Genitourinary: Negative.    Musculoskeletal: Positive for joint pain.   Skin: Negative.    Neurological: Negative.    Endo/Heme/Allergies: Negative.    Psychiatric/Behavioral: Negative.        Objective:     Visit Vitals  BP 110/72 (BP 1 Location: Right upper arm, BP Patient Position: At rest)   Pulse 92   Temp 97.9 ??F (36.6 ??C)   Resp 18   Ht 5' 9"  (1.753 m)   Wt 68.2 kg (150 lb 5.7 oz)   SpO2 91%   BMI 22.20 kg/m??       O2 Device: None (Room air)    Temp (24hrs), Avg:98.7 ??F (37.1 ??C), Min:97.9 ??F (36.6 ??C), Max:100.7 ??F (38.2 ??C)      12/12 0701 - 12/12 1900  In: 960 [P.O.:960]  Out: -   12/10 1901 - 12/12 0700  In: 2670 [P.O.:2620; I.V.:50]  Out: 2660 [Urine:2660]    Physical Exam:  Constitutional:   HEENT:  Respiratory:   Cardiovascular: RRR, nl S1S2  Abdominal Exam: soft   Extremities: no edema, no clubbing, no cyanosis  Neurologic: no focal changes, moves all extremities  Psychiatric: mood is   Musculoskeletal: no acute synovitis      Data Review     Recent Results (from the past 24 hour(s))   METABOLIC PANEL, COMPREHENSIVE    Collection Time: 09/23/20  7:17 AM   Result Value Ref Range    Sodium 132 (L) 135 - 145 mmol/L    Potassium 3.8 3.5 - 5.0 mmol/L  Chloride 97 (L) 100 - 110 mmol/L    CO2 21 20 - 32 mmol/L    Anion gap 18 mmol/L    Glucose 122 (H) 75 - 110 mg/dL    BUN 11 7 - 20 MG/DL    Creatinine 0.73 0.40 - 1.20 MG/DL    BUN/Creatinine ratio 15     GFR est AA >60 >60 ml/min/1.94m    GFR est non-AA >60 >60 ml/min/1.731m   Calcium 8.6 (L) 8.8 - 10.5 MG/DL    Bilirubin, total 0.70 0.10 - 1.20 mg/dL    ALT (SGPT) 21 3 - 35 U/L    AST (SGOT) 19 15 - 40 U/L    Alk. phosphatase 54 35 - 100 U/L    Protein, total 8.2 (H) 6.2 - 8.0 g/dL    Albumin 3.1 (L) 3.5 - 5.0 g/dL    Globulin 5.1 g/dL    A-G Ratio 0.6     CBC WITH AUTOMATED DIFF    Collection Time: 09/23/20  7:17 AM   Result Value Ref Range    WBC 14.3 (H) 4.8 - 10.8 K/uL    RBC 4.36 (L) 4.50 - 6.00 M/uL    HGB 12.6 (L) 14.0 - 18.0 g/dL    HCT 37.6 (L) 42.0 - 52.0 %    MCV 86.2 80.0 - 100.0 FL    MCH 28.9 28.0 - 34.0 PG    MCHC 33.5 32.0 - 36.0 g/dL    RDW 13.3 11.5 - 13.5 %    PLATELET 496 (H) 150 - 400 K/uL    MPV 9.1 7.0 - 12.0 FL    NEUTROPHILS 69 %    LYMPHOCYTES 21 %    MONOCYTES 9 %    EOSINOPHILS 0 %    BASOPHILS 0 %    IMMATURE GRANULOCYTES 1 %    ABS. NEUTROPHILS 9.8 (H) 1.9 - 7.8 K/UL    ABS. LYMPHOCYTES 3.1 1.0 - 4.5 K/UL    ABS. MONOCYTES 1.3 (H) 0.1 - 0.8  K/UL    ABS. EOSINOPHILS 0.0 0.0 - 0.5 K/UL    ABS. BASOPHILS 0.1 0.0 - 0.2 K/UL    ABS. IMM. GRANS. 0.2 (H) 0.0 - 0.1 K/UL    NRBC 0.0 PER 100 WBC    ABSOLUTE NRBC 0.00 K/uL    DF AUTOMATED        Reviewed notes from previous day   Reviewed new clinical lab tests from today    Reviewed imaging independently ??? CXR imaging     Assessment/Plan:     Acute COVID-19  Mild URI symptoms presently, follow for progression of symptoms. Likely began 4 days prior to admission.  Oxygenation stable  Continues with no need for COVID directed medications at this time    IVDU (intravenous drug user)  Social Service Eval    [facility] beta hemolytic strep is suspected  Ceftriaxone IV  Pain Control with Dilaudid, stable without complaints  VTE prevention with Lovenox  PT to improve ambulation and advance activity  Social Service eval  Blood cultures continue with no growth to date   Worsening leukocytosis, recheck CBC on 12/13  Mild hypokalemia with antibiotics      Care Plan discussed with: Patient and with care team including nursing.      I spent 30 minutes of which greater than 50% was spent face-to-face counseling and/or coordinating care to discuss the diagnosis, treatment and management options for patient today.    Signed By: RoHerbie [place]  Estill Bamberg, MD     September 23, 2020 5:54 PM

## 2020-09-23 NOTE — Progress Notes (Signed)
Problem: Falls - Risk of  Goal: *Absence of Falls  Description: Document Paul George Fall Risk and appropriate interventions in the flowsheet.  Outcome: Progressing Towards Goal  Note: Fall Risk Interventions:  Mobility Interventions: Patient to call before getting OOB, Utilize walker, cane, or other assistive device         Medication Interventions: Patient to call before getting OOB, Teach patient to arise slowly    Elimination Interventions: Call light in reach, Patient to call for help with toileting needs, Toilet paper/wipes in reach, Urinal in reach    History of Falls Interventions: Investigate reason for fall         Problem: Patient Education: Go to Patient Education Activity  Goal: Patient/Family Education  Outcome: Progressing Towards Goal     Problem: Airway Clearance - Ineffective  Goal: Achieve or maintain patent airway  Outcome: Progressing Towards Goal     Problem: Gas Exchange - Impaired  Goal: Absence of hypoxia  Outcome: Progressing Towards Goal  Goal: Promote optimal lung function  Outcome: Progressing Towards Goal     Problem: Breathing Pattern - Ineffective  Goal: Ability to achieve and maintain a regular respiratory rate  Outcome: Progressing Towards Goal     Problem: Body Temperature -  Risk of, Imbalanced  Goal: Ability to maintain a body temperature within defined limits  Outcome: Progressing Towards Goal  Goal: Will regain or maintain usual level of consciousness  Outcome: Progressing Towards Goal  Goal: Complications related to the disease process, condition or treatment will be avoided or minimized  Outcome: Progressing Towards Goal     Problem: Isolation Precautions - Risk of Spread of Infection  Goal: Prevent transmission of infectious organism to others  Outcome: Progressing Towards Goal     Problem: Nutrition Deficits  Goal: Optimize nutrtional status  Outcome: Progressing Towards Goal     Problem: Risk for Fluid Volume Deficit  Goal: Maintain normal heart rhythm  Outcome: Progressing  Towards Goal  Goal: Maintain absence of muscle cramping  Outcome: Progressing Towards Goal  Goal: Maintain normal serum potassium, sodium, calcium, phosphorus, and pH  Outcome: Progressing Towards Goal     Problem: Loneliness or Risk for Loneliness  Goal: Demonstrate positive use of time alone when socialization is not possible  Outcome: Progressing Towards Goal     Problem: Fatigue  Goal: Verbalize increase energy and improved vitality  Outcome: Progressing Towards Goal     Problem: Patient Education: Go to Patient Education Activity  Goal: Patient/Family Education  Outcome: Progressing Towards Goal

## 2020-09-24 LAB — CBC WITH AUTO DIFFERENTIAL
Basophils %: 0 %
Basophils Absolute: 0 10*3/uL (ref 0.0–0.2)
Eosinophils %: 0 %
Eosinophils Absolute: 0 10*3/uL (ref 0.0–0.5)
Granulocyte Absolute Count: 0.1 10*3/uL (ref 0.0–0.1)
Hematocrit: 37.8 % — ABNORMAL LOW (ref 42.0–52.0)
Hemoglobin: 12.7 g/dL — ABNORMAL LOW (ref 14.0–18.0)
Immature Granulocytes: 1 %
Lymphocytes %: 19 %
Lymphocytes Absolute: 2.7 10*3/uL (ref 1.0–4.5)
MCH: 28.7 PG (ref 28.0–34.0)
MCHC: 33.6 g/dL (ref 32.0–36.0)
MCV: 85.5 FL (ref 80.0–100.0)
MPV: 9.5 FL (ref 7.0–12.0)
Monocytes %: 9 %
Monocytes Absolute: 1.3 10*3/uL — ABNORMAL HIGH (ref 0.1–0.8)
Neutrophils %: 71 %
Neutrophils Absolute: 10.3 10*3/uL — ABNORMAL HIGH (ref 1.9–7.8)
Nucleated RBCs: 0 PER 100 WBC (ref 0.0–1.0)
Platelets: 495 10*3/uL — ABNORMAL HIGH (ref 150–400)
RBC Comment: NORMAL
RBC: 4.42 M/uL — ABNORMAL LOW (ref 4.50–6.00)
RDW: 13.3 % (ref 11.5–13.5)
WBC: 14.5 10*3/uL — ABNORMAL HIGH (ref 4.8–10.8)

## 2020-09-24 LAB — CBC WITH AUTOMATED DIFF
ABS. BASOPHILS: 0 10*3/uL (ref 0.0–0.2)
ABS. EOSINOPHILS: 0 10*3/uL (ref 0.0–0.5)
ABS. IMM. GRANS.: 0.1 10*3/uL (ref 0.0–0.1)
ABS. LYMPHOCYTES: 2.7 10*3/uL (ref 1.0–4.5)
ABS. MONOCYTES: 1.3 10*3/uL — ABNORMAL HIGH (ref 0.1–0.8)
ABS. NEUTROPHILS: 10.3 10*3/uL — ABNORMAL HIGH (ref 1.9–7.8)
BASOPHILS: 0 %
EOSINOPHILS: 0 %
HCT: 37.8 % — ABNORMAL LOW (ref 42.0–52.0)
HGB: 12.7 g/dL — ABNORMAL LOW (ref 14.0–18.0)
IMMATURE GRANULOCYTES: 1 %
LYMPHOCYTES: 19 %
MCH: 28.7 PG (ref 28.0–34.0)
MCHC: 33.6 g/dL (ref 32.0–36.0)
MCV: 85.5 FL (ref 80.0–100.0)
MONOCYTES: 9 %
MPV: 9.5 FL (ref 7.0–12.0)
NEUTROPHILS: 71 %
NRBC: 0 PER 100 WBC (ref 0.0–1.0)
PLATELET: 495 10*3/uL — ABNORMAL HIGH (ref 150–400)
RBC COMMENTS: NORMAL
RBC: 4.42 M/uL — ABNORMAL LOW (ref 4.50–6.00)
RDW: 13.3 % (ref 11.5–13.5)
WBC: 14.5 10*3/uL — ABNORMAL HIGH (ref 4.8–10.8)

## 2020-09-24 MED FILL — HYDROMORPHONE 2 MG TAB: 2 mg | ORAL | Qty: 1

## 2020-09-24 MED FILL — CEFTRIAXONE 2 GRAM SOLUTION FOR INJECTION: 2 gram | INTRAMUSCULAR | Qty: 2

## 2020-09-24 MED FILL — ENOXAPARIN 40 MG/0.4 ML SUB-Q SYRINGE: 40 mg/0.4 mL | SUBCUTANEOUS | Qty: 0.4

## 2020-09-24 NOTE — Progress Notes (Signed)
Hospitalist Progress Note    Subjective:   Daily Progress Note: 09/24/2020 8:36 PM    Chief Complaint:     I am starting to walk. I would like to change my pain medication to oxycodone from dilaudid as dilaudid is not helping so much    Current Facility-Administered Medications   Medication Dose Route Frequency   ??? oxyCODONE IR (ROXICODONE) tablet 20 mg  20 mg Oral Q4H PRN   ??? levoFLOXacin (LEVAQUIN) tablet 500 mg  500 mg Oral Q24H   ??? sodium chloride (NS) flush 3-10 mL  3-10 mL IntraVENous Q12H   ??? sodium chloride (NS) flush 3-10 mL  3-10 mL IntraVENous PRN   ??? acetaminophen (TYLENOL) tablet 650 mg  650 mg Oral Q4H PRN    Or   ??? acetaminophen (TYLENOL) solution 650 mg  650 mg Oral Q4H PRN    Or   ??? acetaminophen (TYLENOL) suppository 650 mg  650 mg Rectal Q4H PRN   ??? naloxone (NARCAN) injection 0.4 mg  0.4 mg IntraVENous PRN   ??? dicyclomine (BENTYL) capsule 10 mg  10 mg Oral Q6H PRN   ??? diphenhydrAMINE (BENADRYL) injection 12.5 mg  12.5 mg IntraVENous Q4H PRN   ??? ondansetron (ZOFRAN) injection 4 mg  4 mg IntraVENous Q6H PRN    Or   ??? ondansetron (ZOFRAN ODT) tablet 4 mg  4 mg Oral Q6H PRN   ??? magnesium hydroxide (MILK OF MAGNESIA) 400 mg/5 mL oral suspension 30 mL  30 mL Oral DAILY PRN   ??? bisacodyL (DULCOLAX) tablet 10 mg  10 mg Oral DAILY PRN   ??? enoxaparin (LOVENOX) injection 40 mg  40 mg SubCUTAneous Q24H        Review of Systems  Review of Systems   Constitutional: Negative.    HENT: Negative.    Eyes: Negative.    Respiratory: Negative.    Cardiovascular: Negative.    Gastrointestinal: Negative.    Genitourinary: Negative.    Musculoskeletal: Positive for joint pain.   Skin: Negative.    Neurological: Negative.    Endo/Heme/Allergies: Negative.    Psychiatric/Behavioral: Positive for substance abuse.       Objective:     Visit Vitals  BP 107/68 (BP 1 Location: Right upper arm, BP Patient Position: At rest)   Pulse 78   Temp 98.6 ??F (37 ??C)   Resp 18   Ht 5\' 9"  (1.753 m)   Wt 68.2 kg (150 lb 5.7 oz)   SpO2  92%   BMI 22.20 kg/m??      O2 Device: None (Room air)    Temp (24hrs), Avg:98.5 ??F (36.9 ??C), Min:97.5 ??F (36.4 ??C), Max:98.9 ??F (37.2 ??C)      No intake/output data recorded.  12/12 0701 - 12/13 1900  In: 2330 [P.O.:2280; I.V.:50]  Out: 800 [Urine:800]    Physical Exam:  Constitutional: no change, not toxic appearing  HEENT: atraumatic, no icterus, no neck tenderness  Respiratory: clear  Cardiovascular: RRR, nl S1S2  Abdominal Exam: soft   Extremities: no edema, no clubbing, no cyanosis  Neurologic: no focal changes, moves all extremities  Psychiatric: affect is depressed  Musculoskeletal: pain at left SI joint      Data Review     Recent Results (from the past 24 hour(s))   CBC WITH AUTOMATED DIFF    Collection Time: 09/24/20 10:05 AM   Result Value Ref Range    WBC 14.5 (H) 4.8 - 10.8 K/uL    RBC 4.42 (L)  4.50 - 6.00 M/uL    HGB 12.7 (L) 14.0 - 18.0 g/dL    HCT 27.2 (L) 53.6 - 52.0 %    MCV 85.5 80.0 - 100.0 FL    MCH 28.7 28.0 - 34.0 PG    MCHC 33.6 32.0 - 36.0 g/dL    RDW 64.4 03.4 - 74.2 %    PLATELET 495 (H) 150 - 400 K/uL    MPV 9.5 7.0 - 12.0 FL    NEUTROPHILS 71 %    LYMPHOCYTES 19 %    MONOCYTES 9 %    EOSINOPHILS 0 %    BASOPHILS 0 %    NRBC 0.0 0.0 - 1.0 PER 100 WBC    ABS. LYMPHOCYTES 2.7 1.0 - 4.5 K/UL    ABS. MONOCYTES 1.3 (H) 0.1 - 0.8 K/UL    ABS. BASOPHILS 0.0 0.0 - 0.2 K/UL    ABS. EOSINOPHILS 0.0 0.0 - 0.5 K/UL    IMMATURE GRANULOCYTES 1 %    ABS. NEUTROPHILS 10.3 (H) 1.9 - 7.8 K/UL    ABS. IMM. GRANS. 0.1 0.0 - 0.1 K/UL    RBC COMMENTS NORMAL     PLATELET COMMENTS LARGE PLATELETS  PRESENT       DF AUTOMATED        Reviewed notes from previous day   Reviewed new clinical lab tests from today    Reviewed imaging independently ??? CXR imaging     Assessment/Plan:     Pain management  Dilaudid discontinued and oxycodone started  Pt counseled that he will not be discharged on opiates and he understands    Acute COVID-19  Mild URI symptoms presently, symptoms not progressing. Likely began 4 days prior  to admission.  Oxygenation stable  Continues with no need for COVID directed medications at this time    IVDU (intravenous drug user)  Social Service following  Peripheral IV access is difficult due to IVDU      Sacroiliitis (HCC)  Group C beta hemolytic strep is suspected  Blood cultures with no growth to date  Ceftriaxone IV discontinued due to no IV access due to IVDU  Levaquin 500mg  started  Okay for patient not have an IV unless he becomes unstable  Pain Control with Dilaudid, stable without complaints  VTE prevention with Lovenox  PT to improve ambulation and advance activity  Social Service following  Back pain is improving, he is now walking      Care Plan discussed with: Patient and with care team including nursing, case management.  Plan was also discussed with physical therapy.    I spent 30 minutes of which greater than 50% was spent face-to-face counseling and/or coordinating care to discuss the diagnosis, treatment and management options for patient today.    Signed By: , MD     September 24, 2020 8:36 PM

## 2020-09-24 NOTE — Progress Notes (Signed)
THERAPY TREATMENT NOTE  Mental Status                    Pain:                       Bed/Mat Mobility     Supine to Sit: Modified independent  Sit to Supine: Modified independent  Sit to Stand: Supervision  Scooting: Modified independent     Balance  Balance  Sitting: Intact  Standing: Impaired; With support  Standing - Static: Good  Standing - Dynamic : Stage manager  Sit to Stand: Supervision  Stand to Sit: Hydrographic surveyor Device: Walker, rolling  [employer] Bearing Status        Gait  Base of Support: Narrowed  Speed/Cadence: Slow  Step Length: Left shortened; Right shortened        Gait Abnormalities: Antalgic; Decreased step clearance  Ambulation - Level of Assistance: Contact guard assistance; Stand-by assistance  Distance (ft): 50 Feet (ft) (multiple laps in room)                      Cognitive Retraining     Feeding     Toileting     Grooming     Upper Body Bathing     Lower Body Bathing     Upper Body Dressing Assistance     Lower Body Dressing Assistance     Upper extremity assessment:     Lower extremity assessment:              Subjective: Patient resting in bed, agreeable to work with therapy at this time.   Change in Medical Condition: NA  Assessment: Patient will continue to benefit from skilled therapy to address remaining functional deficits: impaired strength, balance and activity tolerance. Patient completing bed mobility without difficulty and stands from EOB wit good balance. Ambulates multiple laps throughout room totaling about 27ft. Ambulates with slow discontinuous gait pattern at CGA progressing to SBA level, limited due to back/hip pain. Patient requires encouragement to participate. Will benefit from continued therapy to assess for appropriate ambulation device with supplemental walking program. Will communicate with PT.   Plan: Continue plan of care  Therapeutic Exercises:  no  Bed alarm activated: N/A  Chair alarm applied: N/A  Call bell within reach: YES  Urinal within reach or Purwick/Foley Catheter in place: N/A  Transportation Plan: private vehicle  Communicated with: RN, [place]  Handouts: NA    PT Therapy Comments: PTA-Y; HH-U**may need 2ww  OT    PT Plan of Care: 5 times/week       Therapist: Courtney Heys, PTA

## 2020-09-24 NOTE — Progress Notes (Signed)
Chart has been reviewed for care management needs: guardianship, substance use disorder, homelessness, and  readmission risk.  No needs identified at this time. The patient was also reviewed during multidisciplinary rounds for purposes of discharge planning. PT recommends that the pt goes home with HHS or outpatient services.  I will follow up with the pt. CM to follow for discharge planning needs.

## 2020-09-24 NOTE — Progress Notes (Signed)
AMBULATION PROGRAM    The patient is at high risk of deconditioning and functional decline secondary to Seriousness of illness and Presence of pain and ability to alleviate/ - Decreased Strength   - Decreased Flexibility   - Pain.  Therefore, the patient is appropriate to continue with a Physical Therapist monitored supplemental walking program 1-2x per day;up to 5 x week with physical therapist aide per plan of care. The patient will be assessed by a Physical Therapists at least bi-weekly or sooner when indicated by a medical/physical status change, to adjust plan of care as needed.     The patient to ambulate up to 1000 feet with SBA without  supplemental oxygen walker, rolling.    2 Minute Walking Test: 16ft.    Therapist: Aris Lot, PT

## 2020-09-24 NOTE — Assessment & Plan Note (Signed)
Oxycodone started  Pt counseled that he will not be discharged on opiates and he understands  Pain management better  Beginning to ambulate with confidence

## 2020-09-24 NOTE — Progress Notes (Signed)
Patient refusing IV abx at 1710, states "I dont want to do that right now, I'm really hurting. Can you come back later". After returning to administer, pt IV infiltrated. Unable to give abx. Pt requiring U/S guided IVs, refusing to "keep getting poked", requesting PICC line. MD notified. Per Dr. Joni Fears, ok to leave IV out this evening and will switch to PO abx at this time.

## 2020-09-25 MED ORDER — OXYCODONE 5 MG TAB
5 mg | ORAL | Status: DC | PRN
Start: 2020-09-25 — End: 2020-09-27
  Administered 2020-09-25 – 2020-09-27 (×13): via ORAL

## 2020-09-25 MED ORDER — LEVOFLOXACIN 500 MG TAB
500 mg | ORAL | Status: DC
Start: 2020-09-25 — End: 2020-09-27
  Administered 2020-09-25 – 2020-09-27 (×3): via ORAL

## 2020-09-25 MED FILL — OXYCODONE 5 MG TAB: 5 mg | ORAL | Qty: 4

## 2020-09-25 MED FILL — LEVOFLOXACIN 500 MG TAB: 500 mg | ORAL | Qty: 1

## 2020-09-25 NOTE — Assessment & Plan Note (Signed)
Heat ordered for each arm  The bilateral arm phlebitis is improving

## 2020-09-25 NOTE — Progress Notes (Signed)
Problem: Falls - Risk of  Goal: *Absence of Falls  Description: Document Paul George Fall Risk and appropriate interventions in the flowsheet.  09/25/2020 1929 by Ollen Gross, RN  Outcome: Progressing Towards Goal  Note: Fall Risk Interventions:  Mobility Interventions: OT consult for ADLs, PT Consult for mobility concerns, PT Consult for assist device competence, Utilize walker, cane, or other assistive device         Medication Interventions: Evaluate medications/consider consulting pharmacy    Elimination Interventions: Call light in reach    History of Falls Interventions: Investigate reason for fall      09/25/2020 0723 by Ollen Gross, RN  Outcome: Progressing Towards Goal  Note: Fall Risk Interventions:  Mobility Interventions: Patient to call before getting OOB         Medication Interventions: Teach patient to arise slowly    Elimination Interventions: Call light in reach    History of Falls Interventions: Investigate reason for fall         Problem: Patient Education: Go to Patient Education Activity  Goal: Patient/Family Education  09/25/2020 1929 by Ollen Gross, RN  Outcome: Progressing Towards Goal  09/25/2020 0723 by Ollen Gross, RN  Outcome: Progressing Towards Goal     Problem: Airway Clearance - Ineffective  Goal: Achieve or maintain patent airway  09/25/2020 1929 by Ollen Gross, RN  Outcome: Progressing Towards Goal  09/25/2020 0723 by Ollen Gross, RN  Outcome: Progressing Towards Goal     Problem: Gas Exchange - Impaired  Goal: Absence of hypoxia  09/25/2020 1929 by Ollen Gross, RN  Outcome: Progressing Towards Goal  09/25/2020 0723 by Ollen Gross, RN  Outcome: Progressing Towards Goal  Goal: Promote optimal lung function  09/25/2020 1929 by Ollen Gross, RN  Outcome: Progressing Towards Goal  09/25/2020 0723 by Ollen Gross, RN  Outcome: Progressing Towards Goal     Problem: Breathing Pattern - Ineffective  Goal:  Ability to achieve and maintain a regular respiratory rate  09/25/2020 1929 by Ollen Gross, RN  Outcome: Progressing Towards Goal  09/25/2020 0723 by Ollen Gross, RN  Outcome: Progressing Towards Goal     Problem: Body Temperature -  Risk of, Imbalanced  Goal: Ability to maintain a body temperature within defined limits  09/25/2020 1929 by Ollen Gross, RN  Outcome: Progressing Towards Goal  09/25/2020 0723 by Ollen Gross, RN  Outcome: Progressing Towards Goal  Goal: Will regain or maintain usual level of consciousness  09/25/2020 1929 by Ollen Gross, RN  Outcome: Progressing Towards Goal  09/25/2020 0723 by Ollen Gross, RN  Outcome: Progressing Towards Goal  Goal: Complications related to the disease process, condition or treatment will be avoided or minimized  09/25/2020 1929 by Ollen Gross, RN  Outcome: Progressing Towards Goal  09/25/2020 0723 by Ollen Gross, RN  Outcome: Progressing Towards Goal     Problem: Isolation Precautions - Risk of Spread of Infection  Goal: Prevent transmission of infectious organism to others  09/25/2020 1929 by Ollen Gross, RN  Outcome: Progressing Towards Goal  09/25/2020 0723 by Ollen Gross, RN  Outcome: Progressing Towards Goal     Problem: Nutrition Deficits  Goal: Optimize nutrtional status  09/25/2020 1929 by Ollen Gross, RN  Outcome: Progressing Towards Goal  09/25/2020 0723 by Ollen Gross, RN  Outcome: Progressing Towards Goal     Problem: Risk for Fluid Volume Deficit  Goal: Maintain normal heart  rhythm  09/25/2020 1929 by Ollen Gross, RN  Outcome: Progressing Towards Goal  09/25/2020 0723 by Ollen Gross, RN  Outcome: Progressing Towards Goal  Goal: Maintain absence of muscle cramping  09/25/2020 1929 by Ollen Gross, RN  Outcome: Progressing Towards Goal  09/25/2020 0723 by Ollen Gross, RN  Outcome: Progressing Towards Goal  Goal:  Maintain normal serum potassium, sodium, calcium, phosphorus, and pH  09/25/2020 1929 by Ollen Gross, RN  Outcome: Progressing Towards Goal  09/25/2020 0723 by Ollen Gross, RN  Outcome: Progressing Towards Goal     Problem: Loneliness or Risk for Loneliness  Goal: Demonstrate positive use of time alone when socialization is not possible  09/25/2020 1929 by Ollen Gross, RN  Outcome: Progressing Towards Goal  09/25/2020 0723 by Ollen Gross, RN  Outcome: Progressing Towards Goal     Problem: Fatigue  Goal: Verbalize increase energy and improved vitality  09/25/2020 1929 by Ollen Gross, RN  Outcome: Progressing Towards Goal  09/25/2020 0723 by Ollen Gross, RN  Outcome: Progressing Towards Goal     Problem: Patient Education: Go to Patient Education Activity  Goal: Patient/Family Education  09/25/2020 1929 by Ollen Gross, RN  Outcome: Progressing Towards Goal  09/25/2020 0723 by Ollen Gross, RN  Outcome: Progressing Towards Goal     Problem: Patient Education: Go to Patient Education Activity  Goal: Patient/Family Education  09/25/2020 1929 by Ollen Gross, RN  Outcome: Progressing Towards Goal  09/25/2020 0723 by Ollen Gross, RN  Outcome: Progressing Towards Goal     12/14 PM Continue pain medications as ordered.  Ongoing assessment of needs for discharge.  K.Rollins, RN

## 2020-09-25 NOTE — Progress Notes (Signed)
Hospitalist Progress Note    Subjective:   Daily Progress Note: 09/25/2020 10:17 PM    Chief Complaint:     Overall, I am starting to feel better. I am starting to get around better.    Current Facility-Administered Medications   Medication Dose Route Frequency   ??? oxyCODONE IR (ROXICODONE) tablet 20 mg  20 mg Oral Q4H PRN   ??? levoFLOXacin (LEVAQUIN) tablet 500 mg  500 mg Oral Q24H   ??? sodium chloride (NS) flush 3-10 mL  3-10 mL IntraVENous Q12H   ??? sodium chloride (NS) flush 3-10 mL  3-10 mL IntraVENous PRN   ??? acetaminophen (TYLENOL) tablet 650 mg  650 mg Oral Q4H PRN    Or   ??? acetaminophen (TYLENOL) solution 650 mg  650 mg Oral Q4H PRN    Or   ??? acetaminophen (TYLENOL) suppository 650 mg  650 mg Rectal Q4H PRN   ??? naloxone (NARCAN) injection 0.4 mg  0.4 mg IntraVENous PRN   ??? dicyclomine (BENTYL) capsule 10 mg  10 mg Oral Q6H PRN   ??? diphenhydrAMINE (BENADRYL) injection 12.5 mg  12.5 mg IntraVENous Q4H PRN   ??? ondansetron (ZOFRAN) injection 4 mg  4 mg IntraVENous Q6H PRN    Or   ??? ondansetron (ZOFRAN ODT) tablet 4 mg  4 mg Oral Q6H PRN   ??? magnesium hydroxide (MILK OF MAGNESIA) 400 mg/5 mL oral suspension 30 mL  30 mL Oral DAILY PRN   ??? bisacodyL (DULCOLAX) tablet 10 mg  10 mg Oral DAILY PRN   ??? enoxaparin (LOVENOX) injection 40 mg  40 mg SubCUTAneous Q24H        Review of Systems  ROS    Objective:     Visit Vitals  BP (!) 94/56 (BP 1 Location: Right upper arm, BP Patient Position: At rest)   Pulse 78   Temp 98.6 ??F (37 ??C)   Resp 18   Ht 5\' 9"  (1.753 m)   Wt 68.4 kg (150 lb 12.7 oz)   SpO2 93%   BMI 22.27 kg/m??      O2 Device: None (Room air)    Temp (24hrs), Avg:98.4 ??F (36.9 ??C), Min:98.2 ??F (36.8 ??C), Max:98.9 ??F (37.2 ??C)      No intake/output data recorded.  12/13 0701 - 12/14 1900  In: 2840 [P.O.:2840]  Out: -     Physical Exam:  Constitutional: mild distress with changes in positio  HEENT: no trauma, no icterus, scarred left cornea  Respiratory: clear  Cardiovascular: RRR, nl S1S2  Abdominal Exam: soft    Extremities: no edema, no clubbing, no cyanosis  Neurologic: no focal changes, moves all extremities  Psychiatric: mood is appropriate and better, more cooperative  Musculoskeletal: no acute synovitis      Data Review     No results found for this or any previous visit (from the past 24 hour(s)).    Reviewed notes from previous day   Reviewed new clinical lab tests from today    Reviewed imaging independently ??? CXR imaging     Assessment/Plan:     Phlebitis  Heat ordered for each arm    Pain management  Dilaudid discontinued and oxycodone started  Pt counseled that he will not be discharged on opiates and he understands  Pain management better  Beginning to ambulate with confidence    Acute COVID-19  Mild URI symptoms presently, symptoms not progressing. Likely began 4 days prior to admission.  Oxygenation stable  Continues with no  need for COVID directed medications at this time    IVDU (intravenous drug user)  Social Service following  Peripheral IV access is difficult due to IVDU      Sacroiliitis (HCC)  Group C beta hemolytic strep is suspected  Blood cultures with no growth to date  Ceftriaxone IV discontinued due to no IV access due to IVDU  Levaquin 500mg  started  Okay for patient not have an IV unless he becomes unstable  Pain Control with oxycodone, stable without complaints  VTE prevention with Lovenox  PT to improve ambulation and advance activity  Social Service following  Back pain is improving, he is now walking      Care Plan discussed with: Patient and with care team including nursing, case management.  Plan was also discussed with     I spent 35 minutes of which greater than 50% was spent face-to-face counseling and/or coordinating care to discuss the diagnosis, treatment and management options for patient today.    Signed By: , MD     September 25, 2020 10:17 PM

## 2020-09-25 NOTE — Progress Notes (Signed)
DECLINED, HOLD, AND/OR NOT APPROPRIATE  09/25/2020  Patient was not seen for skilled therapy treatment, secondary to pt declined. Explain: pt declined PT this afternoon and the Aide this morning for the walking program , pt states he is getting around his room just fine , pt is up ad lib , when asked if he would like to be dc from PT he stated "yes" .   Plan: Will follow up as schedule permits.  Therapist: Conley Canal PTA

## 2020-09-25 NOTE — Progress Notes (Signed)
Spoke with RN Florentina Addison and PT [facility] prior to activity.  Patient declined to ambulate at this time.  Patient stated his leg and hip hurt and he walks around in his room.      Kristen Loader Vidas  Rehab Aide

## 2020-09-25 NOTE — Progress Notes (Signed)
0930 Met with patient to assess.  Able to make needs known.  Resting in bed but easily arousable.  Will continue to monitor and assess for needs.  Continue medicating for pain in hip.    K.Rollins, RN

## 2020-09-25 NOTE — Progress Notes (Signed)
Problem: Falls - Risk of  Goal: *Absence of Falls  Description: Document Bridgette Habermann Fall Risk and appropriate interventions in the flowsheet.  Outcome: Progressing Towards Goal  Note: Fall Risk Interventions:  Mobility Interventions: Patient to call before getting OOB         Medication Interventions: Teach patient to arise slowly    Elimination Interventions: Call light in reach    History of Falls Interventions: Investigate reason for fall         Problem: Patient Education: Go to Patient Education Activity  Goal: Patient/Family Education  Outcome: Progressing Towards Goal     Problem: Airway Clearance - Ineffective  Goal: Achieve or maintain patent airway  Outcome: Progressing Towards Goal     Problem: Gas Exchange - Impaired  Goal: Absence of hypoxia  Outcome: Progressing Towards Goal  Goal: Promote optimal lung function  Outcome: Progressing Towards Goal     Problem: Breathing Pattern - Ineffective  Goal: Ability to achieve and maintain a regular respiratory rate  Outcome: Progressing Towards Goal     Problem: Body Temperature -  Risk of, Imbalanced  Goal: Ability to maintain a body temperature within defined limits  Outcome: Progressing Towards Goal  Goal: Will regain or maintain usual level of consciousness  Outcome: Progressing Towards Goal  Goal: Complications related to the disease process, condition or treatment will be avoided or minimized  Outcome: Progressing Towards Goal     Problem: Isolation Precautions - Risk of Spread of Infection  Goal: Prevent transmission of infectious organism to others  Outcome: Progressing Towards Goal     Problem: Nutrition Deficits  Goal: Optimize nutrtional status  Outcome: Progressing Towards Goal     Problem: Risk for Fluid Volume Deficit  Goal: Maintain normal heart rhythm  Outcome: Progressing Towards Goal  Goal: Maintain absence of muscle cramping  Outcome: Progressing Towards Goal  Goal: Maintain normal serum potassium, sodium, calcium, phosphorus, and pH  Outcome:  Progressing Towards Goal     Problem: Loneliness or Risk for Loneliness  Goal: Demonstrate positive use of time alone when socialization is not possible  Outcome: Progressing Towards Goal     Problem: Fatigue  Goal: Verbalize increase energy and improved vitality  Outcome: Progressing Towards Goal     Problem: Patient Education: Go to Patient Education Activity  Goal: Patient/Family Education  Outcome: Progressing Towards Goal     Problem: Patient Education: Go to Patient Education Activity  Goal: Patient/Family Education  Outcome: Progressing Towards Goal  12/14 Ongoing assessment of needs for DC.  K.Rollins, RN

## 2020-09-26 LAB — CBC WITH AUTO DIFFERENTIAL
Basophils %: 0 %
Basophils Absolute: 0 10*3/uL (ref 0.0–0.2)
Eosinophils %: 1 %
Eosinophils Absolute: 0.1 10*3/uL (ref 0.0–0.5)
Granulocyte Absolute Count: 0.1 10*3/uL (ref 0.0–0.1)
Hematocrit: 37.1 % — ABNORMAL LOW (ref 42.0–52.0)
Hemoglobin: 12.3 g/dL — ABNORMAL LOW (ref 14.0–18.0)
Immature Granulocytes: 1 %
Lymphocytes %: 31 %
Lymphocytes Absolute: 2.9 10*3/uL (ref 1.0–4.5)
MCH: 28.7 PG (ref 28.0–34.0)
MCHC: 33.2 g/dL (ref 32.0–36.0)
MCV: 86.5 FL (ref 80.0–100.0)
MPV: 8.8 FL (ref 7.0–12.0)
Monocytes %: 10 %
Monocytes Absolute: 0.9 10*3/uL — ABNORMAL HIGH (ref 0.1–0.8)
NRBC Absolute: 0 10*3/uL
Neutrophils %: 57 %
Neutrophils Absolute: 5.2 10*3/uL (ref 1.9–7.8)
Nucleated RBCs: 0 PER 100 WBC
Platelets: 470 10*3/uL — ABNORMAL HIGH (ref 150–400)
RBC: 4.29 M/uL — ABNORMAL LOW (ref 4.50–6.00)
RDW: 13.3 % (ref 11.5–13.5)
WBC: 9.2 10*3/uL (ref 4.8–10.8)

## 2020-09-26 LAB — C-REACTIVE PROTEIN: CRP: 8.5 mg/dL — ABNORMAL HIGH (ref 0.00–0.90)

## 2020-09-26 LAB — CULTURE, BLOOD 1: Culture: NO GROWTH

## 2020-09-26 LAB — CBC WITH AUTOMATED DIFF
ABS. BASOPHILS: 0 10*3/uL (ref 0.0–0.2)
ABS. EOSINOPHILS: 0.1 10*3/uL (ref 0.0–0.5)
ABS. IMM. GRANS.: 0.1 10*3/uL (ref 0.0–0.1)
ABS. LYMPHOCYTES: 2.9 10*3/uL (ref 1.0–4.5)
ABS. MONOCYTES: 0.9 10*3/uL — ABNORMAL HIGH (ref 0.1–0.8)
ABS. NEUTROPHILS: 5.2 10*3/uL (ref 1.9–7.8)
ABSOLUTE NRBC: 0 10*3/uL
BASOPHILS: 0 %
EOSINOPHILS: 1 %
HCT: 37.1 % — ABNORMAL LOW (ref 42.0–52.0)
HGB: 12.3 g/dL — ABNORMAL LOW (ref 14.0–18.0)
IMMATURE GRANULOCYTES: 1 %
LYMPHOCYTES: 31 %
MCH: 28.7 PG (ref 28.0–34.0)
MCHC: 33.2 g/dL (ref 32.0–36.0)
MCV: 86.5 FL (ref 80.0–100.0)
MONOCYTES: 10 %
MPV: 8.8 FL (ref 7.0–12.0)
NEUTROPHILS: 57 %
NRBC: 0 PER 100 WBC
PLATELET: 470 10*3/uL — ABNORMAL HIGH (ref 150–400)
RBC: 4.29 M/uL — ABNORMAL LOW (ref 4.50–6.00)
RDW: 13.3 % (ref 11.5–13.5)
WBC: 9.2 10*3/uL (ref 4.8–10.8)

## 2020-09-26 LAB — C REACTIVE PROTEIN, QT: C-Reactive protein: 8.5 mg/dL — ABNORMAL HIGH (ref 0.00–0.90)

## 2020-09-26 LAB — CULTURE, BLOOD: Culture result:: NO GROWTH

## 2020-09-26 MED FILL — OXYCODONE 5 MG TAB: 5 mg | ORAL | Qty: 4

## 2020-09-26 MED FILL — LEVOFLOXACIN 500 MG TAB: 500 mg | ORAL | Qty: 1

## 2020-09-26 NOTE — Progress Notes (Signed)
Problem: Falls - Risk of  Goal: *Absence of Falls  Description: Document Paul George Fall Risk and appropriate interventions in the flowsheet.  Outcome: Progressing Towards Goal  Note: Fall Risk Interventions:  Mobility Interventions: Utilize walker, cane, or other assistive device, PT Consult for mobility concerns         Medication Interventions: Teach patient to arise slowly, Patient to call before getting OOB    Elimination Interventions: Bed/chair exit alarm, Call light in reach, Patient to call for help with toileting needs, Urinal in reach    History of Falls Interventions: Investigate reason for fall         Problem: Airway Clearance - Ineffective  Goal: Achieve or maintain patent airway  Outcome: Progressing Towards Goal     Problem: Gas Exchange - Impaired  Goal: Absence of hypoxia  Outcome: Progressing Towards Goal     Problem: Breathing Pattern - Ineffective  Goal: Ability to achieve and maintain a regular respiratory rate  Outcome: Progressing Towards Goal

## 2020-09-26 NOTE — Progress Notes (Signed)
Hospitalist Progress Note    Subjective:   Daily Progress Note: 09/26/2020 3:17 PM    Chief Complaint:     Are we still thinking that I can go home this week? My left buttock pain is getting better.    Current Facility-Administered Medications   Medication Dose Route Frequency   ??? oxyCODONE IR (ROXICODONE) tablet 20 mg  20 mg Oral Q4H PRN   ??? levoFLOXacin (LEVAQUIN) tablet 500 mg  500 mg Oral Q24H   ??? sodium chloride (NS) flush 3-10 mL  3-10 mL IntraVENous Q12H   ??? sodium chloride (NS) flush 3-10 mL  3-10 mL IntraVENous PRN   ??? acetaminophen (TYLENOL) tablet 650 mg  650 mg Oral Q4H PRN    Or   ??? acetaminophen (TYLENOL) solution 650 mg  650 mg Oral Q4H PRN    Or   ??? acetaminophen (TYLENOL) suppository 650 mg  650 mg Rectal Q4H PRN   ??? naloxone (NARCAN) injection 0.4 mg  0.4 mg IntraVENous PRN   ??? dicyclomine (BENTYL) capsule 10 mg  10 mg Oral Q6H PRN   ??? diphenhydrAMINE (BENADRYL) injection 12.5 mg  12.5 mg IntraVENous Q4H PRN   ??? ondansetron (ZOFRAN) injection 4 mg  4 mg IntraVENous Q6H PRN    Or   ??? ondansetron (ZOFRAN ODT) tablet 4 mg  4 mg Oral Q6H PRN   ??? magnesium hydroxide (MILK OF MAGNESIA) 400 mg/5 mL oral suspension 30 mL  30 mL Oral DAILY PRN   ??? bisacodyL (DULCOLAX) tablet 10 mg  10 mg Oral DAILY PRN   ??? enoxaparin (LOVENOX) injection 40 mg  40 mg SubCUTAneous Q24H        Review of Systems  Review of Systems   Constitutional: Negative.    HENT: Negative.    Eyes: Negative.    Respiratory: Negative.    Cardiovascular: Negative.  Negative for leg swelling.   Gastrointestinal: Negative.    Genitourinary: Negative.    Musculoskeletal: Positive for joint pain and myalgias.   Skin: Negative.    Neurological: Negative.    Endo/Heme/Allergies: Negative.    Psychiatric/Behavioral: Positive for substance abuse.       Objective:     Visit Vitals  BP 109/62 (BP 1 Location: Right upper arm, BP Patient Position: At rest)   Pulse 72   Temp 97.8 ??F (36.6 ??C)   Resp 20   Ht 5\' 9"  (1.753 m)   Wt 68.4 kg (150 lb 12.7 oz)    SpO2 97%   BMI 22.27 kg/m??      O2 Device: None (Room air)    Temp (24hrs), Avg:98.2 ??F (36.8 ??C), Min:97.8 ??F (36.6 ??C), Max:98.6 ??F (37 ??C)      12/15 0701 - 12/15 1900  In: 640 [P.O.:640]  Out: -   12/13 1901 - 12/15 0700  In: 3080 [P.O.:3080]  Out: -     Physical Exam:  Constitutional: brighter today  HEENT: no change, scarring left cornea  Respiratory: clear  Cardiovascular: RRR, nl S1S2  Abdominal Exam: soft and nontender  Extremities: no edema, no clubbing, no cyanosis  Vascular: thrombotic veins improving, softer  Neurologic: no focal changes, moves all extremities  Psychiatric: mood is appropriate  Musculoskeletal:  less tenderness at SI joint      Data Review     Recent Results (from the past 24 hour(s))   CBC WITH AUTOMATED DIFF    Collection Time: 09/26/20 11:38 AM   Result Value Ref Range    WBC  9.2 4.8 - 10.8 K/uL    RBC 4.29 (L) 4.50 - 6.00 M/uL    HGB 12.3 (L) 14.0 - 18.0 g/dL    HCT 67.6 (L) 19.5 - 52.0 %    MCV 86.5 80.0 - 100.0 FL    MCH 28.7 28.0 - 34.0 PG    MCHC 33.2 32.0 - 36.0 g/dL    RDW 09.3 26.7 - 12.4 %    PLATELET 470 (H) 150 - 400 K/uL    MPV 8.8 7.0 - 12.0 FL    NEUTROPHILS 57 %    LYMPHOCYTES 31 %    MONOCYTES 10 %    EOSINOPHILS 1 %    BASOPHILS 0 %    IMMATURE GRANULOCYTES 1 %    ABS. NEUTROPHILS 5.2 1.9 - 7.8 K/UL    ABS. LYMPHOCYTES 2.9 1.0 - 4.5 K/UL    ABS. MONOCYTES 0.9 (H) 0.1 - 0.8 K/UL    ABS. EOSINOPHILS 0.1 0.0 - 0.5 K/UL    ABS. BASOPHILS 0.0 0.0 - 0.2 K/UL    ABS. IMM. GRANS. 0.1 0.0 - 0.1 K/UL    NRBC 0.0 PER 100 WBC    ABSOLUTE NRBC 0.00 K/uL    DF AUTOMATED    C REACTIVE PROTEIN, QT    Collection Time: 09/26/20 11:38 AM   Result Value Ref Range    C-Reactive protein 8.50 (H) 0.00 - 0.90 mg/dL       Reviewed notes from previous day   Reviewed new clinical lab tests from today    Reviewed imaging independently ??? CXR imaging     Assessment/Plan:     Phlebitis  Heat ordered for each arm  The bilateral arm phlebitis is improving    Pain management  Oxycodone started  Pt  counseled that he will not be discharged on opiates and he understands  Pain management better  Beginning to ambulate with confidence    Acute COVID-19  Mild URI symptoms presently, symptoms not progressing. Likely began 4 days prior to admission.  Oxygenation stable  Continues with no need for COVID directed medications at this time    IVDU (intravenous drug user)  Social Service following  Peripheral IV access is difficult due to IVDU  We are managing without an IV and response to quinolone has been good      Sacroiliitis (HCC)  Group C beta hemolytic strep is suspected  Blood cultures with no growth to date  Ceftriaxone IV discontinued due to no IV access due to IVDU  Levaquin 500mg  started  Okay for patient not have an IV unless he becomes unstable  Pain Control with oxycodone, stable without complaints  VTE prevention with Lovenox  PT to improve ambulation and advance activity  Social Service following  Back pain is improving, he is now walking  WBC improving, C-reactive protein improving  Possible discharge on 12/16        Care Plan discussed with: Patient and with care team including nursing, case management.      I spent 40 minutes of which greater than 50% was spent face-to-face counseling and/or coordinating care to discuss the diagnosis, treatment and management options for patient today.    Signed By: 1/17, MD     September 26, 2020 3:17 PM

## 2020-09-26 NOTE — Progress Notes (Signed)
Problem: Falls - Risk of  Goal: *Absence of Falls  Description: Document Bridgette Habermann Fall Risk and appropriate interventions in the flowsheet.  09/26/2020 0750 by Ollen Gross, RN  Outcome: Progressing Towards Goal  Note: Fall Risk Interventions:  Mobility Interventions: Utilize walker, cane, or other assistive device,PT Consult for mobility concerns         Medication Interventions: Teach patient to arise slowly,Patient to call before getting OOB    Elimination Interventions: Bed/chair exit alarm,Call light in reach,Patient to call for help with toileting needs,Urinal in reach    History of Falls Interventions: Investigate reason for fall      09/25/2020 1929 by Ollen Gross, RN  Outcome: Progressing Towards Goal  Note: Fall Risk Interventions:  Mobility Interventions: OT consult for ADLs, PT Consult for mobility concerns, PT Consult for assist device competence, Utilize walker, cane, or other assistive device         Medication Interventions: Evaluate medications/consider consulting pharmacy    Elimination Interventions: Call light in reach    History of Falls Interventions: Investigate reason for fall         Problem: Patient Education: Go to Patient Education Activity  Goal: Patient/Family Education  09/26/2020 0750 by Ollen Gross, RN  Outcome: Progressing Towards Goal  09/25/2020 1929 by Ollen Gross, RN  Outcome: Progressing Towards Goal     Problem: Airway Clearance - Ineffective  Goal: Achieve or maintain patent airway  09/26/2020 0750 by Ollen Gross, RN  Outcome: Progressing Towards Goal  09/25/2020 1929 by Ollen Gross, RN  Outcome: Progressing Towards Goal     Problem: Gas Exchange - Impaired  Goal: Absence of hypoxia  09/26/2020 0750 by Ollen Gross, RN  Outcome: Progressing Towards Goal  09/25/2020 1929 by Ollen Gross, RN  Outcome: Progressing Towards Goal  Goal: Promote optimal lung function  09/26/2020 0750 by Ollen Gross,  RN  Outcome: Progressing Towards Goal  09/25/2020 1929 by Ollen Gross, RN  Outcome: Progressing Towards Goal     Problem: Breathing Pattern - Ineffective  Goal: Ability to achieve and maintain a regular respiratory rate  09/26/2020 0750 by Ollen Gross, RN  Outcome: Progressing Towards Goal  09/25/2020 1929 by Ollen Gross, RN  Outcome: Progressing Towards Goal     Problem: Body Temperature -  Risk of, Imbalanced  Goal: Ability to maintain a body temperature within defined limits  09/26/2020 0750 by Ollen Gross, RN  Outcome: Progressing Towards Goal  09/25/2020 1929 by Ollen Gross, RN  Outcome: Progressing Towards Goal  Goal: Will regain or maintain usual level of consciousness  09/26/2020 0750 by Ollen Gross, RN  Outcome: Progressing Towards Goal  09/25/2020 1929 by Ollen Gross, RN  Outcome: Progressing Towards Goal  Goal: Complications related to the disease process, condition or treatment will be avoided or minimized  09/26/2020 0750 by Ollen Gross, RN  Outcome: Progressing Towards Goal  09/25/2020 1929 by Ollen Gross, RN  Outcome: Progressing Towards Goal     Problem: Isolation Precautions - Risk of Spread of Infection  Goal: Prevent transmission of infectious organism to others  09/26/2020 0750 by Ollen Gross, RN  Outcome: Progressing Towards Goal  09/25/2020 1929 by Ollen Gross, RN  Outcome: Progressing Towards Goal     Problem: Nutrition Deficits  Goal: Optimize nutrtional status  09/26/2020 0750 by Ollen Gross, RN  Outcome: Progressing Towards Goal  09/25/2020 1929 by Ollen Gross,  RN  Outcome: Progressing Towards Goal     Problem: Risk for Fluid Volume Deficit  Goal: Maintain normal heart rhythm  09/26/2020 0750 by Ollen Gross, RN  Outcome: Progressing Towards Goal  09/25/2020 1929 by Ollen Gross, RN  Outcome: Progressing Towards Goal  Goal: Maintain absence of muscle  cramping  09/26/2020 0750 by Ollen Gross, RN  Outcome: Progressing Towards Goal  09/25/2020 1929 by Ollen Gross, RN  Outcome: Progressing Towards Goal  Goal: Maintain normal serum potassium, sodium, calcium, phosphorus, and pH  09/26/2020 0750 by Ollen Gross, RN  Outcome: Progressing Towards Goal  09/25/2020 1929 by Ollen Gross, RN  Outcome: Progressing Towards Goal     Problem: Loneliness or Risk for Loneliness  Goal: Demonstrate positive use of time alone when socialization is not possible  09/26/2020 0750 by Ollen Gross, RN  Outcome: Progressing Towards Goal  09/25/2020 1929 by Ollen Gross, RN  Outcome: Progressing Towards Goal     Problem: Fatigue  Goal: Verbalize increase energy and improved vitality  09/26/2020 0750 by Ollen Gross, RN  Outcome: Progressing Towards Goal  09/25/2020 1929 by Ollen Gross, RN  Outcome: Progressing Towards Goal     Problem: Patient Education: Go to Patient Education Activity  Goal: Patient/Family Education  09/26/2020 0750 by Ollen Gross, RN  Outcome: Progressing Towards Goal  09/25/2020 1929 by Ollen Gross, RN  Outcome: Progressing Towards Goal     Problem: Patient Education: Go to Patient Education Activity  Goal: Patient/Family Education  09/26/2020 0750 by Ollen Gross, RN  Outcome: Progressing Towards Goal  09/25/2020 1929 by Ollen Gross, RN  Outcome: Progressing Towards Goal    12/15 Ongoing assessment of needs for discharge.  Possible DC home today.  K.Rollins, RN

## 2020-09-26 NOTE — Progress Notes (Signed)
Patient reports pain in his hip/back a 6 out of 10. Medicated with PRN oxycodone 20mg . Denies shortness of breath. On room air. Independent in room with cane. Snack and beverages provided.

## 2020-09-26 NOTE — Progress Notes (Signed)
09/26/20 1526   Type and Reason for Visit   Type and Reason for Visit Initial  (LOS)   Nutrition Assessment   Nutrition Assessment Pt admitted due to infection and covid 19 positive. Assesse for LOS. Pt has been eating well, having good appetite, eating 76-100% of meals. Pt seems to be have been stable in wt, being around 150lb. No N/V/D or GI issues noted.   Anthropometric Measures   Height 5\' 9"  (1.753 m)   Current Body Weight 68.4 kg (150 lb 12.7 oz)   Weight Source   (chart)   Ideal Body Weight (lbs) (Calculated) 160 lbs   Ideal Body Weight (Kg) (Calculated) 73 kg   % IBW (Calculated) 94.2 %   BMI (calculated) 22.3   BMI Category Normal weight (BMI 18.5-24.9)   Nutrient-Energy (Caloric) Requirements   Mifflin-St. Jeor (Overwt/Obese Pts) (kcals/day) 1619.38   Weight Used for Energy Requirements Current   Energy Requirements Based On Kcal/kg  (25-30kcal/kg)   Total Energy Requirements (kcals/day) 1700-2000kcal   Nutrient-Protein Requirements   Weight Used for Protein Requirements Current  (1.2-1.5g/kg)   Total Protein Requirements (g/day) 82-102g   Fluid Requirements   Method Used for Fluid Requirements 1 ml/kcal   Total Fluid Requirements (ml/day) 1700-2062mL or per MD   Current Oral Intake   Average Meal Intake 76-100%  (over 8 meals)   Nutrition Diagnosis    Problem #1 No nutrition diagnosis at this time   Nutrition Intervention   Food and/or Nutrient Delivery Continue current diet  (Encourage oral intake as tolerated within the diet order)   Goals   Goals For pt to consume at least 75% of meals   Monitoring and Evaluation   Food/Nutrient Intake Outcomes Food and nutrient intake  (For pt to consume at least 75% of meals)   Physical Signs/Symptoms Outcomes Biochemical data;GI status;Weight   Nutrition Follow-Up Date   Nutrition Follow-up Date   (RA 12/21)

## 2020-09-26 NOTE — Progress Notes (Signed)
0820 Met with patient to assess and give requested pain medication. Patient able to ring appropriately and make needs known. Up ad lib in room, uses cane to ambulate due to hip pain.  Patient has a flat affect.     1100 Discussed with provider possibility of patient discharging home as he is only receiving pain medication and oral antibiotics.  Provider to order labs to determine is inflammatory markers and WBC count trends down.  Patient has been refusing labs.  Discussed with patient the need to labs to determine if current antibiotics are working to treat current infection.  Patient agrees to have labs drawn.    1140 CRP and WBC trending down.  WBC within normal range.  CRP remains elevated but improving.    K.Rollins, RN

## 2020-09-27 LAB — CULTURE, BLOOD,  2ND DRAW
Culture result:: NO GROWTH
Culture: NO GROWTH

## 2020-09-27 MED ORDER — LEVOFLOXACIN 500 MG TAB
500 mg | ORAL_TABLET | ORAL | 0 refills | Status: AC
Start: 2020-09-27 — End: 2020-10-18

## 2020-09-27 MED FILL — OXYCODONE 5 MG TAB: 5 mg | ORAL | Qty: 4

## 2020-09-27 MED FILL — LEVOFLOXACIN 500 MG TAB: 500 mg | ORAL | Qty: 1

## 2020-09-27 NOTE — Progress Notes (Signed)
CTN spoke with pt and pt states his stepfather will not let him stay at his house. Pt states he has no where to go and he is homeless. CTN contacted [facility]. [facility] stated to email covid-19 community support- referral placed at this time. Margie Billet RN to pick up pts medications at [facility].

## 2020-09-27 NOTE — Progress Notes (Signed)
Chart reviewed. Kerri Russell-Wiles updated me on the pt's discharge plans.  Faylene Kurtz from the [facility] and stated that the pt can go there today and stay until 10/01/20.  He stated that they would help him get to another shelter or home with family/friends.  He asked me to update the pt. I called [place], 5th floor charge nurse and updated her.  She let me know that [facility] will be here at 4:10 to transport the pt.  I called the pt and updated him on all the above information.  It was impressed upon him that he could not stay at the [facility] after 10/01/20.  He communicated understanding. I called [facility] back and updated him.  Pt is being discharged to the [facility].

## 2020-09-27 NOTE — Progress Notes (Signed)
COVID Positive Patient Transition of Care    Patient has oxygen in home currently No  Patient will need new referral for home oxygen No, pt on room air at this time    Order in for RT  No  DME Company referral No  Name of DME  Delivery location home/hospital    Outreach to primary care, care management for coordination of care Will at discharge Yes, flagged Megan at [facility]    Follow up appt arranged with pcp Yes, Dec 29 at 12:30    [facility]HS in place No needs at discharge  Name of agency  Start of care date

## 2020-09-27 NOTE — Progress Notes (Signed)
Problem: Patient Education: Go to Patient Education Activity  Goal: Patient/Family Education  Outcome: Progressing Towards Goal     Problem: Falls - Risk of  Goal: *Absence of Falls  Description: Document Paul George Fall Risk and appropriate interventions in the flowsheet.  09/27/2020 1504 by Lynne Logan, RN  Outcome: Resolved/Met  09/27/2020 0742 by Lynne Logan, RN  Outcome: Progressing Towards Goal  Note: Fall Risk Interventions:  Mobility Interventions: Utilize walker, cane, or other assistive device,Utilize gait belt for transfers/ambulation,Patient to call before getting OOB,OT consult for ADLs,PT Consult for mobility concerns         Medication Interventions: Assess postural VS orthostatic hypotension,Evaluate medications/consider consulting pharmacy,Patient to call before getting OOB,Teach patient to arise slowly    Elimination Interventions: Call light in reach,Urinal in reach    History of Falls Interventions: Utilize gait belt for transfer/ambulation         Problem: Patient Education: Go to Patient Education Activity  Goal: Patient/Family Education  09/27/2020 1504 by Lynne Logan, RN  Outcome: Resolved/Met  09/27/2020 0742 by Lynne Logan, RN  Outcome: Progressing Towards Goal     Problem: Airway Clearance - Ineffective  Goal: Achieve or maintain patent airway  09/27/2020 1504 by Lynne Logan, RN  Outcome: Resolved/Met  09/27/2020 0742 by Lynne Logan, RN  Outcome: Progressing Towards Goal     Problem: Gas Exchange - Impaired  Goal: Absence of hypoxia  09/27/2020 1504 by Lynne Logan, RN  Outcome: Resolved/Met  09/27/2020 0742 by Lynne Logan, RN  Outcome: Progressing Towards Goal  Goal: Promote optimal lung function  09/27/2020 1504 by Lynne Logan, RN  Outcome: Resolved/Met  09/27/2020 0742 by Lynne Logan, RN  Outcome: Progressing Towards Goal     Problem: Breathing Pattern - Ineffective  Goal: Ability to achieve and  maintain a regular respiratory rate  09/27/2020 1504 by Lynne Logan, RN  Outcome: Resolved/Met  09/27/2020 0742 by Lynne Logan, RN  Outcome: Progressing Towards Goal     Problem: Body Temperature -  Risk of, Imbalanced  Goal: Ability to maintain a body temperature within defined limits  09/27/2020 1504 by Lynne Logan, RN  Outcome: Resolved/Met  09/27/2020 0742 by Lynne Logan, RN  Outcome: Progressing Towards Goal  Goal: Will regain or maintain usual level of consciousness  09/27/2020 1504 by Lynne Logan, RN  Outcome: Resolved/Met  09/27/2020 0742 by Lynne Logan, RN  Outcome: Progressing Towards Goal  Goal: Complications related to the disease process, condition or treatment will be avoided or minimized  09/27/2020 1504 by Lynne Logan, RN  Outcome: Resolved/Met  09/27/2020 0742 by Lynne Logan, RN  Outcome: Progressing Towards Goal     Problem: Isolation Precautions - Risk of Spread of Infection  Goal: Prevent transmission of infectious organism to others  09/27/2020 1504 by Lynne Logan, RN  Outcome: Resolved/Met  09/27/2020 0742 by Lynne Logan, RN  Outcome: Progressing Towards Goal     Problem: Nutrition Deficits  Goal: Optimize nutrtional status  09/27/2020 1504 by Lynne Logan, RN  Outcome: Resolved/Met  09/27/2020 0742 by Lynne Logan, RN  Outcome: Progressing Towards Goal     Problem: Risk for Fluid Volume Deficit  Goal: Maintain normal heart rhythm  09/27/2020 1504 by Lynne Logan, RN  Outcome: Resolved/Met  09/27/2020 0742 by Lynne Logan, RN  Outcome: Progressing Towards Goal  Goal: Maintain absence of muscle cramping  09/27/2020 1504 by Davis Gourd,  Corinna Lines, RN  Outcome: Resolved/Met  09/27/2020 6720 by Lynne Logan, RN  Outcome: Progressing Towards Goal  Goal: Maintain normal serum potassium, sodium, calcium, phosphorus, and pH  09/27/2020 1504 by Lynne Logan,  RN  Outcome: Resolved/Met  09/27/2020 0742 by Lynne Logan, RN  Outcome: Progressing Towards Goal     Problem: Loneliness or Risk for Loneliness  Goal: Demonstrate positive use of time alone when socialization is not possible  09/27/2020 1504 by Lynne Logan, RN  Outcome: Resolved/Met  09/27/2020 0742 by Lynne Logan, RN  Outcome: Progressing Towards Goal     Problem: Fatigue  Goal: Verbalize increase energy and improved vitality  09/27/2020 1504 by Lynne Logan, RN  Outcome: Resolved/Met  09/27/2020 0742 by Lynne Logan, RN  Outcome: Progressing Towards Goal     Problem: Patient Education: Go to Patient Education Activity  Goal: Patient/Family Education  09/27/2020 1504 by Lynne Logan, RN  Outcome: Resolved/Met  09/27/2020 0742 by Lynne Logan, RN  Outcome: Progressing Towards Goal

## 2020-09-27 NOTE — Progress Notes (Signed)
Met with patient to assess and give pain medication.  Patient alert and oriented and able to make needs known. Able to ring for assistance.  Discharge orders in, but patient states he has nowhere to go.  Case management involved.  New medications being picked up for patient.  Ongoing assessment of needs.    K.Rollins, RN

## 2020-09-27 NOTE — Progress Notes (Signed)
Problem: Falls - Risk of  Goal: *Absence of Falls  Description: Document Paul George Fall Risk and appropriate interventions in the flowsheet.  Outcome: Progressing Towards Goal  Note: Fall Risk Interventions:  Mobility Interventions: Utilize walker, cane, or other assistive device,Utilize gait belt for transfers/ambulation,Patient to call before getting OOB,OT consult for ADLs,PT Consult for mobility concerns         Medication Interventions: Assess postural VS orthostatic hypotension,Evaluate medications/consider consulting pharmacy,Patient to call before getting OOB,Teach patient to arise slowly    Elimination Interventions: Call light in reach,Urinal in reach    History of Falls Interventions: Utilize gait belt for transfer/ambulation         Problem: Patient Education: Go to Patient Education Activity  Goal: Patient/Family Education  Outcome: Progressing Towards Goal     Problem: Airway Clearance - Ineffective  Goal: Achieve or maintain patent airway  Outcome: Progressing Towards Goal     Problem: Gas Exchange - Impaired  Goal: Absence of hypoxia  Outcome: Progressing Towards Goal  Goal: Promote optimal lung function  Outcome: Progressing Towards Goal     Problem: Breathing Pattern - Ineffective  Goal: Ability to achieve and maintain a regular respiratory rate  Outcome: Progressing Towards Goal     Problem: Body Temperature -  Risk of, Imbalanced  Goal: Ability to maintain a body temperature within defined limits  Outcome: Progressing Towards Goal  Goal: Will regain or maintain usual level of consciousness  Outcome: Progressing Towards Goal  Goal: Complications related to the disease process, condition or treatment will be avoided or minimized  Outcome: Progressing Towards Goal     Problem: Isolation Precautions - Risk of Spread of Infection  Goal: Prevent transmission of infectious organism to others  Outcome: Progressing Towards Goal     Problem: Nutrition Deficits  Goal: Optimize nutrtional status  Outcome:  Progressing Towards Goal     Problem: Risk for Fluid Volume Deficit  Goal: Maintain normal heart rhythm  Outcome: Progressing Towards Goal  Goal: Maintain absence of muscle cramping  Outcome: Progressing Towards Goal  Goal: Maintain normal serum potassium, sodium, calcium, phosphorus, and pH  Outcome: Progressing Towards Goal     Problem: Loneliness or Risk for Loneliness  Goal: Demonstrate positive use of time alone when socialization is not possible  Outcome: Progressing Towards Goal     Problem: Fatigue  Goal: Verbalize increase energy and improved vitality  Outcome: Progressing Towards Goal     Problem: Patient Education: Go to Patient Education Activity  Goal: Patient/Family Education  Outcome: Progressing Towards Goal     Problem: Patient Education: Go to Patient Education Activity  Goal: Patient/Family Education  Outcome: Progressing Towards Goal     12/16 Labs improved.  Normal WBC count, CRP trending down.  Hip pain improving.  Continuing pain medication.  Patient rings appropriately for pain medications.  Ambulating independently in room with cane.  Ongoing assessment of needs for DC.  K.Rollins, RN

## 2020-09-27 NOTE — Discharge Summary (Signed)
Physician Discharge Summary       Patient: Paul George MRN: [id number]  SSN: [id number]    Date of Birth: 12-11-1985  Age: 34 y.o.  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.90]    * Discharge Diagnoses:    Hospital Problems as of 09/27/2020 Date Reviewed: September 16, 2020          Codes Class Noted - Resolved POA    Phlebitis ICD-10-CM: I80.9  ICD-9-CM: 451.9  09/25/2020 - Present No    Overview Signed 09/25/2020 10:16 PM by Delorise Royals, MD     Right forearm and left upper arm with phlebitis from recent peripheral IV's  Antibiotics changed to Levaquin PO               Pain management (Chronic) ICD-10-CM: R52  ICD-9-CM: 780.96  09/24/2020 - Present Yes    Overview Signed 09/24/2020  8:37 PM by Delorise Royals, MD     Pt requesting change from dilaudid to oxycodone               Sacroiliitis (HCC) ICD-10-CM: M46.1  ICD-9-CM: 720.2  09/21/2020 - Present Yes        IVDU (intravenous drug user) ICD-10-CM: F19.90  ICD-9-CM: 305.90  09/21/2020 - Present Yes        Acute COVID-19 ICD-10-CM: U07.1  ICD-9-CM: 079.89  09/21/2020 - Present Yes              * Hospital Course: CUAUHTEMOC George is a 34 y.o. male with IVDU, who presents to the ED complaining of feeling unwell for about 4-5 days and with exacerbation of left sided sacroiliac pain. He left the hospital against medical advice on 09/05/20 prior to completing his course of Ceftriaxone. 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 He had Group C beta hemolytic streptococcus bacteremia on prior admission. He was started on ceftriaxone and had difficulty with IV access and was changed to levofloxacin. His WBC improved from 12 to 9.2 and his CRP improved from 16.6 to 8.50. His pain was  initially managed with oral dilaudid and this was changed to oxycodone 20mg  every 4 hours as needed. He reported a better response to the oral oxycodone. PT worked with him. He uses a cane when needed and his ambulation improved significantly by the time of discharge. Patient plans to go [facility] after discharge.    Regarding COVID, he was diagnosed with acute COVID. He had a cough but he did not have any concerning clinical symptoms related to the COVID. Education was provided regarding quarantining and recommendation for vaccination.      * Procedures: none  * No surgery found *      Consults:   None    Significant Diagnostic Studies:     XR CHEST PA LAT    Result Date: 09/21/2020  1. Hyperinflated lungs.  No acute process identified.     MRI LUMB SPINE W WO CONT    Result Date: 09/21/2020  Redemonstration of edema and enhancement at the left sacroiliac joint space, findings compatible with infectious sacroiliitis.  Findings at the [facility] joint space are not clearly different from the previous examination.  Previously described rim enhancing  fluid collection along the anterior margin of the joint space, essentially within the posterior iliacus muscle, does appear improved.  On today's study, there is band of enhancement here, with perhaps a tiny focus of residual microabscess. Degenerative changes in the lumbar spine as above, most notable at L5-S1.  Findings appear very similar to the previous study. No findings in the lumbar spine to suggest discitis/osteomyelitis.  No findings of an epidural abscess. Other findings as above.        Discharge Exam:  Physical Exam  Vitals reviewed.   Constitutional:       Appearance: Normal appearance. He is normal weight.   HENT:      Head: Normocephalic.      Right Ear: External ear normal.      Left Ear: External ear normal.      Mouth/Throat:      Mouth: Mucous membranes are moist.      Pharynx: Oropharynx is clear.   Eyes:      Conjunctiva/sclera: Conjunctivae normal.    Cardiovascular:      Rate and Rhythm: Normal rate and regular rhythm.      Pulses: Normal pulses.      Heart sounds: Normal heart sounds.   Pulmonary:      Effort: Pulmonary effort is normal.      Breath sounds: Normal breath sounds.   Abdominal:      General: Abdomen is flat. Bowel sounds are normal.      Palpations: Abdomen is soft.   Musculoskeletal:         General: Tenderness (left SI joint improved) present. Normal range of motion.      Cervical back: Normal range of motion and neck supple.      Right lower leg: No edema.      Left lower leg: No edema.   Skin:     General: Skin is warm.      Capillary Refill: Capillary refill takes less than 2 seconds.   Neurological:      General: No focal deficit present.      Mental Status: He is alert and oriented to person, place, and time. Mental status is at baseline.      Gait: Gait normal.   Psychiatric:         Mood and Affect: Mood normal.         Behavior: Behavior normal.         Thought Content: Thought content normal.         Judgment: Judgment normal.          * Discharge Condition: improved  * Disposition: Home    Discharge Medications:  Discharge Medication List as of 09/27/2020  3:40 PM      START taking these medications    Details   levoFLOXacin (LEVAQUIN) 500 mg tablet Take 1 Tablet by mouth every twenty-four (24) hours for 21 days., Normal, Disp-21 Tablet, R-0             * Follow-up Care/Patient Instructions:  Activity: Activity as tolerated  Diet: Regular Diet  Wound Care: None needed  Home Oxygen: no    Follow-up Information     Follow up With Specialties Details Why Contact Info    Gayla Doss, FNP Nurse Practitioner On 10/09/2020 @ 11:00 with Neale Burly [address]  [place] [address]  [phone]          Total time for discharge was 45 minutes    Signed:  Marveen Reeks  Joni Fears, MD  09/28/2020  11:21 PM

## 2020-09-27 NOTE — Progress Notes (Signed)
CTN spoke with pt. Pt states he is going to his step fathers house at discharge. Pt does not have transportation. He has a new prescription to be picked up at [facility]. Discussed this with Margie Billet and she will pick up pts medication for him and pt will need to have ambulance take him to step father's house. CTN let pt know that ambulance bill will be expensive and if he can find transportation home that would be best. CTN flagged Megan at [facility] for covid follow up from pcp office and hospital follow up appt scheduled. Discussed case with Sidney/charge nurse.    Pts prescription copay is 0.00 at [facility]. Prescription should be ready for pickup at 1:45 today.

## 2020-09-27 NOTE — Progress Notes (Signed)
CTN spoke with Herbert Seta at [facility].They are looking to make arrangements for pt for a hotel room to quarantine. She was given Ronald Pippins phone number and the 5th floor phone number. They will also provide transportation to hotel. Luther Parody Springer/charge nurse also notified. Pts home antibiotic will be sent home with him at discharge.

## 2020-09-29 ENCOUNTER — Inpatient Hospital Stay: Admit: 2020-09-29 | Discharge: 2020-09-30 | Payer: MEDICAID | Attending: Medical

## 2020-09-29 DIAGNOSIS — M461 Sacroiliitis, not elsewhere classified: Secondary | ICD-10-CM

## 2020-09-29 LAB — CBC WITH AUTO DIFFERENTIAL
Basophils %: 1 %
Basophils Absolute: 0.1 10*3/uL (ref 0.0–0.2)
Eosinophils %: 0 %
Eosinophils Absolute: 0 10*3/uL (ref 0.0–0.5)
Granulocyte Absolute Count: 0 10*3/uL (ref 0.0–0.1)
Hematocrit: 42.4 % (ref 42.0–52.0)
Hemoglobin: 14.3 g/dL (ref 14.0–18.0)
Immature Granulocytes: 0 %
Lymphocytes %: 24 %
Lymphocytes Absolute: 2.1 10*3/uL (ref 1.0–4.5)
MCH: 28.8 PG (ref 28.0–34.0)
MCHC: 33.7 g/dL (ref 32.0–36.0)
MCV: 85.5 FL (ref 80.0–100.0)
MPV: 9.1 FL (ref 7.0–12.0)
Monocytes %: 7 %
Monocytes Absolute: 0.6 10*3/uL (ref 0.1–0.8)
NRBC Absolute: 0 10*3/uL
Neutrophils %: 68 %
Neutrophils Absolute: 6.1 10*3/uL (ref 1.9–7.8)
Nucleated RBCs: 0 PER 100 WBC
Platelets: 418 10*3/uL — ABNORMAL HIGH (ref 150–400)
RBC: 4.96 M/uL (ref 4.50–6.00)
RDW: 13.3 % (ref 11.5–13.5)
WBC: 8.9 10*3/uL (ref 4.8–10.8)

## 2020-09-29 LAB — URINALYSIS WITH REFLEX TO CULTURE
Bilirubin, Urine: NEGATIVE
Blood, Urine: NEGATIVE
Glucose, Ur: NEGATIVE mg/dL
Ketones, Urine: NEGATIVE mg/dL
Leukocyte Esterase, Urine: NEGATIVE
Nitrite, Urine: NEGATIVE
Protein, UA: NEGATIVE mg/dL
Specific Gravity, UA: 1.02 NA (ref 1.005–1.030)
Urobilinogen, UA, POCT: 0.2 EU/dL (ref 0.1–1.0)
pH, UA: 6 NA (ref 5.0–9.0)

## 2020-09-29 LAB — CBC WITH AUTOMATED DIFF
ABS. BASOPHILS: 0.1 10*3/uL (ref 0.0–0.2)
ABS. EOSINOPHILS: 0 10*3/uL (ref 0.0–0.5)
ABS. IMM. GRANS.: 0 10*3/uL (ref 0.0–0.1)
ABS. LYMPHOCYTES: 2.1 10*3/uL (ref 1.0–4.5)
ABS. MONOCYTES: 0.6 10*3/uL (ref 0.1–0.8)
ABS. NEUTROPHILS: 6.1 10*3/uL (ref 1.9–7.8)
ABSOLUTE NRBC: 0 10*3/uL
BASOPHILS: 1 %
EOSINOPHILS: 0 %
HCT: 42.4 % (ref 42.0–52.0)
HGB: 14.3 g/dL (ref 14.0–18.0)
IMMATURE GRANULOCYTES: 0 %
LYMPHOCYTES: 24 %
MCH: 28.8 PG (ref 28.0–34.0)
MCHC: 33.7 g/dL (ref 32.0–36.0)
MCV: 85.5 FL (ref 80.0–100.0)
MONOCYTES: 7 %
MPV: 9.1 FL (ref 7.0–12.0)
NEUTROPHILS: 68 %
NRBC: 0 PER 100 WBC
PLATELET: 418 10*3/uL — ABNORMAL HIGH (ref 150–400)
RBC: 4.96 M/uL (ref 4.50–6.00)
RDW: 13.3 % (ref 11.5–13.5)
WBC: 8.9 10*3/uL (ref 4.8–10.8)

## 2020-09-29 LAB — UA WITH REFLEX MICRO AND CULTURE
Bilirubin: NEGATIVE
Blood: NEGATIVE
Glucose: NEGATIVE mg/dL
Ketone: NEGATIVE mg/dL
Leukocyte Esterase: NEGATIVE
Nitrites: NEGATIVE
Protein: NEGATIVE mg/dL
Specific gravity: 1.02 (ref 1.005–1.030)
Urobilinogen: 0.2 EU/dL (ref 0.1–1.0)
pH (UA): 6 (ref 5.0–9.0)

## 2020-09-29 NOTE — ED Provider Notes (Signed)
HPI   This is a 34 year old male who has a history of IVDU who presents to the ED complaining of having pain that seems to be worsening in his left lower back.  He was admitted in November with sepsis, bacteremia and sacroiliitis.  Blood culture positive for group C beta-hemolytic strep.  Was sensitive to Rocephin.  He received a total of 10 days of plan 14 days of Rocephin.  Patient signed out against medical advice on 09/05/2020.  An MRI was performed at that time of the lumbar spine showing this sacroiliitis with a small 1 cm intrapelvic abscess.  Patient returned and was admitted on 09/21/2020.  Complaining of worsening left-sided sacroiliac pain.  He was started on ceftriaxone and had difficulty with IV access and was switched to levofloxacin.  He had improvement in his lab values including WBC and CRP.  He was initially managed with oral Dilaudid and that was changed oxycodone.  He felt improved on day of discharge 09/27/2020.  He states that he has been compliant with taking his levofloxacin 500 mg daily.  He denies any fevers chills or sweats.  He once again complains that when he left the hospital his pain was primarily in the left buttock region but now is in the left lower back.  He denies any radicular symptoms.  No saddle anesthesia, urinary or fecal incontinence, urinary retention or new weakness in the leg.  Past Medical History:   Diagnosis Date   ??? Blind hypertensive left eye    ??? IV drug abuse (HCC)    ??? Pain management 09/24/2020    Pt requesting change from dilaudid to oxycodone    ??? Polysubstance abuse (HCC)        History reviewed. No pertinent surgical history.      Family History:   Family history unknown: Yes       Social History     Socioeconomic History   ??? Marital status: SINGLE     Spouse name: Not on file   ??? Number of children: Not on file   ??? Years of education: Not on file   ??? Highest education level: Not on file   Occupational History   ??? Not on file   Tobacco Use   ??? Smoking  status: Current Every Day Smoker     Packs/day: 1.00   ??? Smokeless tobacco: Current User   Substance and Sexual Activity   ??? Alcohol use: Not Currently   ??? Drug use: Yes     Types: Heroin, Methamphetamines, IV   ??? Sexual activity: Not on file   Other Topics Concern   ??? Not on file   Social History Narrative   ??? Not on file     Social Determinants of Health     Financial Resource Strain:    ??? Difficulty of Paying Living Expenses: Not on file   Food Insecurity:    ??? Worried About Running Out of Food in the Last Year: Not on file   ??? Ran Out of Food in the Last Year: Not on file   Transportation Needs:    ??? Lack of Transportation (Medical): Not on file   ??? Lack of Transportation (Non-Medical): Not on file   Physical Activity:    ??? Days of Exercise per Week: Not on file   ??? Minutes of Exercise per Session: Not on file   Stress:    ??? Feeling of Stress : Not on file   Social Connections:    ???  Frequency of Communication with Friends and Family: Not on file   ??? Frequency of Social Gatherings with Friends and Family: Not on file   ??? Attends Religious Services: Not on file   ??? Active Member of Clubs or Organizations: Not on file   ??? Attends BankerClub or Organization Meetings: Not on file   ??? Marital Status: Not on file   Intimate Partner Violence:    ??? Fear of Current or Ex-Partner: Not on file   ??? Emotionally Abused: Not on file   ??? Physically Abused: Not on file   ??? Sexually Abused: Not on file   Housing Stability:    ??? Unable to Pay for Housing in the Last Year: Not on file   ??? Number of Places Lived in the Last Year: Not on file   ??? Unstable Housing in the Last Year: Not on file         ALLERGIES: Patient has no known allergies.    Review of Systems  Per HPI.  Remainder of 10 point review systems is negative.  Vitals:    09/29/20 1636 09/29/20 1639   BP: 125/71 120/75   Pulse: 100 99   Resp: 16 18   Temp: 97.7 ??F (36.5 ??C) 98.5 ??F (36.9 ??C)   SpO2: 98% 100%   Weight:  68 kg (150 lb)   Height:  5\' 9"  (1.753 m)             Physical Exam  Vitals and nursing note reviewed.   Constitutional:       General: He is not in acute distress.     Appearance: He is well-developed and normal weight. He is not ill-appearing.   HENT:      Head: Normocephalic and atraumatic.      Right Ear: External ear normal.      Left Ear: External ear normal.      Nose: Nose normal.      Mouth/Throat:      Mouth: Mucous membranes are moist.      Pharynx: Oropharynx is clear.   Eyes:      General: No scleral icterus.     Extraocular Movements: Extraocular movements intact.      Conjunctiva/sclera: Conjunctivae normal.      Comments: There is opacity over the lens of the left eye which she has a history of blindness of.   Neck:      Thyroid: No thyromegaly.      Vascular: No JVD.   Cardiovascular:      Rate and Rhythm: Normal rate and regular rhythm.   Pulmonary:      Effort: Pulmonary effort is normal.   Abdominal:      General: There is no distension.   Musculoskeletal:         General: Normal range of motion.      Cervical back: Normal range of motion and neck supple.      Right lower leg: No edema.      Left lower leg: No edema.      Comments: Patient is able to get from a supine to seated in then to standing position independently.  He appears modestly uncomfortable during this time.  Inspection of the back is unremarkable.  There is no rash erythema ecchymosis or evidence of trauma.  He has complaints of pain to palpation over the midline lumbar spine and left paralumbar musculature as well along the left SI joint.  He has decreased range of motion to forward  flexion secondary to pain.  Sensation is intact to light touch.  He is able to ambulate with his cane although slowly.   Skin:     General: Skin is warm and dry.      Coloration: Skin is not jaundiced.      Findings: No rash.   Neurological:      General: No focal deficit present.      Mental Status: He is alert and oriented to person, place, and time.      Deep Tendon Reflexes: Reflexes are normal  and symmetric.   Psychiatric:         Mood and Affect: Mood normal.         Behavior: Behavior normal.         Thought Content: Thought content normal.          MDM   34 year old with history of IVDU, sepsis with bacteremia and infectious left sacroiliitis.  Now presenting with increasing pain.  He is just using over the counter pain medications.  Feels that things are getting worse.  Will repeat his labs including a CBC CRP and sed rate.  Also get a CMP.  Will make sure that he does not have an upward trend to his white count or inflammatory markers.  Will initially treat him with lidocaine patches and intramuscular Toradol.  ED Course as of 09/29/20 2036   Sat Sep 29, 2020   1836 Received a call from the laboratory.  They did not have enough blood to run his chemistries completely.  They will come back and redraw him and will add on a CRP as well. [PO]   2020 Patient had his coat on and was walking out of his room.  I asked him what was going on.  He stated he is going to leave.  I told him that we do not have all of his blood work back yet may need to redraw him as a did not have enough.  He states that despite the Toradol and lidocaine patches that his pain was no better.  I encouraged him to stay so that we can make sure that there isn't a increase in his inflammatory markers any can be reassessed.  He stated that was just going to leave.  Asked if he would stay to sign AMA papers and he declined.  I asked him if we could contact him if any of his labs needed to be addressed.  He states he has no contact information and walked out.  He was encouraged to return to the emergency department if he changed his mind, if his symptoms worsened or to follow-up with his primary care provider this coming week. [PO]      ED Course User Index  [PO] Paul George, [place]     Vital Signs for this visit:  Patient Vitals for the past 12 hrs:   Temp Pulse Resp BP SpO2   09/29/20 1639 98.5 ??F (36.9 ??C) 99 18 120/75 100 %   09/29/20  1636 97.7 ??F (36.5 ??C) 100 16 125/71 98 %       Lab findings during this visit (only abnormal values will be noted, if no value noted then the result was normal range):  Labs Reviewed   CBC WITH AUTOMATED DIFF - Abnormal; Notable for the following components:       Result Value    PLATELET 418 (*)     All other components within normal limits   UA WITH REFLEX  MICRO AND CULTURE   C REACTIVE PROTEIN, QT   METABOLIC PANEL, COMPREHENSIVE   SED RATE, AUTOMATED       Radiology studies during this visit  No results found.    Medications given in the ED:  Medications   lidocaine 4 % patch 2 Patch (2 Patches TransDERmal Apply Patch 09/29/20 1925)   ketorolac tromethamine (TORADOL) 60 mg/2 mL injection 60 mg (60 mg IntraMUSCular Given 09/29/20 1925)       Diagnosis:    ICD-10-CM ICD-9-CM   1. Sacroiliitis (HCC)  M46.1 720.2   2. IVDU (intravenous drug user)  F19.90 305.90   3. Left-sided low back pain without sciatica, unspecified chronicity  M54.50 724.2       Condition at disposition:  Condition unchanged    Disposition:  Eloped    Discharge prescriptions and/or changes if applicable:    Current Discharge Medication List          Follow-up:  No follow-up provider specified.    Please note that portions of this document were created using the M*Modal Fluency Direct dictation system.  Any inconsistencies or typographical errors may be the result of mis-transcription that persist in spite of proof-reading and should be addressed with the document creator.      Procedures      NIH Stroke Scale

## 2020-09-29 NOTE — ED Notes (Signed)
 Hip pain for a while worse over past few days reports an infection in left hip for past month was given abx which he is still on. Pt tested positive covid for past 4 weeks

## 2020-09-29 NOTE — Progress Notes (Signed)
Please see case management notes from yesterday. Pt was d/c from this hospital yesterday and can stay at the [facility] through 10/01/2020. If he needs transport home [facility] is willing to do so as it is covered by [facility] where he is covid positive and should not be going in a taxi.

## 2020-09-30 MED ORDER — KETOROLAC TROMETHAMINE 60 MG/2 ML IM
60 mg/2 mL | Freq: Once | INTRAMUSCULAR | Status: AC
Start: 2020-09-30 — End: 2020-09-29
  Administered 2020-09-30: via INTRAMUSCULAR

## 2020-09-30 MED ORDER — LIDOCAINE 4 % TOPICAL PATCH (12 HOUR DURATION)
4 % | CUTANEOUS | Status: DC
Start: 2020-09-30 — End: 2020-09-30

## 2020-09-30 MED FILL — ASPERCREME (LIDOCAINE) 4 % TOPICAL PATCH: 4 % | CUTANEOUS | Qty: 2

## 2020-09-30 MED FILL — KETOROLAC TROMETHAMINE 60 MG/2 ML IM: 60 mg/2 mL | INTRAMUSCULAR | Qty: 2
